# Patient Record
Sex: Female | Born: 1935 | Race: White | Hispanic: No | Smoking: Never smoker
Health system: Southern US, Community
[De-identification: ages and names within clinical notes are randomized; demographics above are authoritative.]

## PROBLEM LIST (undated history)

## (undated) DIAGNOSIS — R0602 Shortness of breath: Secondary | ICD-10-CM

## (undated) DIAGNOSIS — F32A Depression, unspecified: Secondary | ICD-10-CM

## (undated) DIAGNOSIS — C44300 Unspecified malignant neoplasm of skin of unspecified part of face: Secondary | ICD-10-CM

## (undated) DIAGNOSIS — C569 Malignant neoplasm of unspecified ovary: Secondary | ICD-10-CM

## (undated) DIAGNOSIS — R51 Headache: Secondary | ICD-10-CM

## (undated) DIAGNOSIS — M1990 Unspecified osteoarthritis, unspecified site: Secondary | ICD-10-CM

## (undated) DIAGNOSIS — R55 Syncope and collapse: Secondary | ICD-10-CM

## (undated) DIAGNOSIS — M719 Bursopathy, unspecified: Secondary | ICD-10-CM

## (undated) DIAGNOSIS — F329 Major depressive disorder, single episode, unspecified: Secondary | ICD-10-CM

## (undated) DIAGNOSIS — N390 Urinary tract infection, site not specified: Secondary | ICD-10-CM

## (undated) DIAGNOSIS — E039 Hypothyroidism, unspecified: Secondary | ICD-10-CM

## (undated) DIAGNOSIS — G4700 Insomnia, unspecified: Secondary | ICD-10-CM

## (undated) DIAGNOSIS — F419 Anxiety disorder, unspecified: Secondary | ICD-10-CM

## (undated) DIAGNOSIS — J439 Emphysema, unspecified: Secondary | ICD-10-CM

## (undated) DIAGNOSIS — E538 Deficiency of other specified B group vitamins: Secondary | ICD-10-CM

## (undated) DIAGNOSIS — K5792 Diverticulitis of intestine, part unspecified, without perforation or abscess without bleeding: Secondary | ICD-10-CM

## (undated) HISTORY — PX: DILATION AND CURETTAGE OF UTERUS: SHX78

## (undated) HISTORY — PX: FOOT SURGERY: SHX648

## (undated) HISTORY — PX: TONSILLECTOMY: SUR1361

## (undated) HISTORY — PX: THYROIDECTOMY: SHX17

## (undated) HISTORY — PX: ABDOMINAL HYSTERECTOMY: SHX81

---

## 1997-10-08 ENCOUNTER — Other Ambulatory Visit: Admission: RE | Admit: 1997-10-08 | Discharge: 1997-10-08 | Payer: Self-pay | Admitting: Gynecology

## 1998-09-12 HISTORY — PX: CATARACT EXTRACTION W/ INTRAOCULAR LENS  IMPLANT, BILATERAL: SHX1307

## 1999-02-16 ENCOUNTER — Other Ambulatory Visit: Admission: RE | Admit: 1999-02-16 | Discharge: 1999-02-16 | Payer: Self-pay | Admitting: Gynecology

## 2000-04-28 ENCOUNTER — Other Ambulatory Visit: Admission: RE | Admit: 2000-04-28 | Discharge: 2000-04-28 | Payer: Self-pay | Admitting: Gynecology

## 2001-06-27 ENCOUNTER — Other Ambulatory Visit: Admission: RE | Admit: 2001-06-27 | Discharge: 2001-06-27 | Payer: Self-pay | Admitting: Gynecology

## 2002-07-07 ENCOUNTER — Ambulatory Visit (HOSPITAL_COMMUNITY): Admission: RE | Admit: 2002-07-07 | Discharge: 2002-07-07 | Payer: Self-pay | Admitting: Internal Medicine

## 2002-07-07 ENCOUNTER — Encounter: Payer: Self-pay | Admitting: Internal Medicine

## 2002-07-10 ENCOUNTER — Encounter: Payer: Self-pay | Admitting: General Surgery

## 2002-07-11 ENCOUNTER — Encounter (INDEPENDENT_AMBULATORY_CARE_PROVIDER_SITE_OTHER): Payer: Self-pay | Admitting: Specialist

## 2002-07-11 ENCOUNTER — Inpatient Hospital Stay (HOSPITAL_COMMUNITY): Admission: RE | Admit: 2002-07-11 | Discharge: 2002-07-20 | Payer: Self-pay | Admitting: General Surgery

## 2002-07-31 ENCOUNTER — Ambulatory Visit: Admission: RE | Admit: 2002-07-31 | Discharge: 2002-07-31 | Payer: Self-pay | Admitting: Gynecologic Oncology

## 2002-08-29 ENCOUNTER — Ambulatory Visit (HOSPITAL_BASED_OUTPATIENT_CLINIC_OR_DEPARTMENT_OTHER): Admission: RE | Admit: 2002-08-29 | Discharge: 2002-08-29 | Payer: Self-pay | Admitting: General Surgery

## 2002-08-29 ENCOUNTER — Encounter: Payer: Self-pay | Admitting: General Surgery

## 2003-02-06 ENCOUNTER — Ambulatory Visit (HOSPITAL_COMMUNITY): Admission: RE | Admit: 2003-02-06 | Discharge: 2003-02-06 | Payer: Self-pay | Admitting: Oncology

## 2003-03-05 ENCOUNTER — Ambulatory Visit: Admission: RE | Admit: 2003-03-05 | Discharge: 2003-03-05 | Payer: Self-pay | Admitting: Gynecologic Oncology

## 2003-06-26 ENCOUNTER — Ambulatory Visit: Admission: RE | Admit: 2003-06-26 | Discharge: 2003-06-26 | Payer: Self-pay | Admitting: Gynecologic Oncology

## 2003-08-21 ENCOUNTER — Ambulatory Visit (HOSPITAL_COMMUNITY): Admission: RE | Admit: 2003-08-21 | Discharge: 2003-08-21 | Payer: Self-pay | Admitting: Oncology

## 2003-11-26 ENCOUNTER — Ambulatory Visit: Admission: RE | Admit: 2003-11-26 | Discharge: 2003-11-26 | Payer: Self-pay | Admitting: Gynecologic Oncology

## 2003-12-10 ENCOUNTER — Ambulatory Visit: Payer: Self-pay | Admitting: Oncology

## 2003-12-20 ENCOUNTER — Ambulatory Visit (HOSPITAL_COMMUNITY): Admission: RE | Admit: 2003-12-20 | Discharge: 2003-12-20 | Payer: Self-pay | Admitting: Gynecologic Oncology

## 2004-02-05 ENCOUNTER — Ambulatory Visit: Payer: Self-pay | Admitting: Oncology

## 2004-04-01 ENCOUNTER — Ambulatory Visit: Payer: Self-pay | Admitting: Oncology

## 2004-06-02 ENCOUNTER — Ambulatory Visit: Payer: Self-pay | Admitting: Oncology

## 2004-07-13 ENCOUNTER — Ambulatory Visit (HOSPITAL_COMMUNITY): Admission: RE | Admit: 2004-07-13 | Discharge: 2004-07-13 | Payer: Self-pay | Admitting: Oncology

## 2004-08-11 ENCOUNTER — Ambulatory Visit: Admission: RE | Admit: 2004-08-11 | Discharge: 2004-08-11 | Payer: Self-pay | Admitting: Gynecologic Oncology

## 2004-09-08 ENCOUNTER — Ambulatory Visit: Payer: Self-pay | Admitting: Oncology

## 2004-11-05 ENCOUNTER — Ambulatory Visit: Payer: Self-pay | Admitting: Oncology

## 2005-01-05 ENCOUNTER — Ambulatory Visit: Payer: Self-pay | Admitting: Oncology

## 2005-01-12 ENCOUNTER — Ambulatory Visit (HOSPITAL_COMMUNITY): Admission: RE | Admit: 2005-01-12 | Discharge: 2005-01-12 | Payer: Self-pay | Admitting: Oncology

## 2005-02-02 ENCOUNTER — Ambulatory Visit: Admission: RE | Admit: 2005-02-02 | Discharge: 2005-02-02 | Payer: Self-pay | Admitting: Gynecologic Oncology

## 2005-03-09 ENCOUNTER — Ambulatory Visit: Payer: Self-pay | Admitting: Oncology

## 2005-05-06 ENCOUNTER — Ambulatory Visit: Payer: Self-pay | Admitting: Oncology

## 2005-05-07 LAB — CBC WITH DIFFERENTIAL/PLATELET
Basophils Absolute: 0 10*3/uL (ref 0.0–0.1)
Eosinophils Absolute: 0 10*3/uL (ref 0.0–0.5)
HCT: 38 % (ref 34.8–46.6)
HGB: 13.1 g/dL (ref 11.6–15.9)
LYMPH%: 41.8 % (ref 14.0–48.0)
MCV: 88.6 fL (ref 81.0–101.0)
MONO%: 10.6 % (ref 0.0–13.0)
NEUT#: 1.7 10*3/uL (ref 1.5–6.5)
NEUT%: 45.6 % (ref 39.6–76.8)
Platelets: 198 10*3/uL (ref 145–400)
RDW: 11.9 % (ref 11.3–14.5)

## 2005-05-07 LAB — COMPREHENSIVE METABOLIC PANEL
ALT: <8 U/L (ref 0–40)
AST: 14 U/L (ref 0–37)
Alkaline Phosphatase: 58 U/L (ref 39–117)
Creatinine, Ser: 0.7 mg/dL (ref 0.4–1.2)
Sodium: 136 mEq/L (ref 135–145)
Total Bilirubin: 0.6 mg/dL (ref 0.3–1.2)
Total Protein: 7 g/dL (ref 6.0–8.3)

## 2005-06-30 ENCOUNTER — Ambulatory Visit: Payer: Self-pay | Admitting: Oncology

## 2005-08-27 ENCOUNTER — Ambulatory Visit: Payer: Self-pay | Admitting: Oncology

## 2005-09-18 ENCOUNTER — Emergency Department (HOSPITAL_COMMUNITY): Admission: EM | Admit: 2005-09-18 | Discharge: 2005-09-18 | Payer: Self-pay | Admitting: Family Medicine

## 2005-10-20 ENCOUNTER — Ambulatory Visit: Payer: Self-pay | Admitting: Oncology

## 2005-10-22 LAB — CBC WITH DIFFERENTIAL/PLATELET
BASO%: 2.5 % — ABNORMAL HIGH (ref 0.0–2.0)
Basophils Absolute: 0.1 10*3/uL (ref 0.0–0.1)
EOS%: 2.2 % (ref 0.0–7.0)
HCT: 36.3 % (ref 34.8–46.6)
HGB: 12.6 g/dL (ref 11.6–15.9)
MCH: 30.9 pg (ref 26.0–34.0)
MCHC: 34.8 g/dL (ref 32.0–36.0)
MCV: 88.7 fL (ref 81.0–101.0)
MONO%: 7.8 % (ref 0.0–13.0)
NEUT%: 55.7 % (ref 39.6–76.8)
RDW: 13.8 % (ref 11.3–14.5)

## 2005-10-22 LAB — COMPREHENSIVE METABOLIC PANEL
AST: 12 U/L (ref 0–37)
Alkaline Phosphatase: 63 U/L (ref 39–117)
BUN: 14 mg/dL (ref 6–23)
Creatinine, Ser: 0.86 mg/dL (ref 0.40–1.20)

## 2005-12-14 ENCOUNTER — Ambulatory Visit: Payer: Self-pay | Admitting: Oncology

## 2005-12-17 LAB — COMPREHENSIVE METABOLIC PANEL
BUN: 10 mg/dL (ref 6–23)
CO2: 22 mEq/L (ref 19–32)
Calcium: 9.3 mg/dL (ref 8.4–10.5)
Chloride: 103 mEq/L (ref 96–112)
Creatinine, Ser: 0.8 mg/dL (ref 0.40–1.20)

## 2005-12-17 LAB — CBC WITH DIFFERENTIAL/PLATELET
Basophils Absolute: 0 10*3/uL (ref 0.0–0.1)
HCT: 36.1 % (ref 34.8–46.6)
HGB: 12.7 g/dL (ref 11.6–15.9)
MONO#: 0.4 10*3/uL (ref 0.1–0.9)
NEUT%: 56.5 % (ref 39.6–76.8)
WBC: 4.7 10*3/uL (ref 3.9–10.0)
lymph#: 1.6 10*3/uL (ref 0.9–3.3)

## 2006-01-14 ENCOUNTER — Ambulatory Visit (HOSPITAL_COMMUNITY): Admission: RE | Admit: 2006-01-14 | Discharge: 2006-01-14 | Payer: Self-pay | Admitting: Oncology

## 2006-02-08 ENCOUNTER — Ambulatory Visit: Admission: RE | Admit: 2006-02-08 | Discharge: 2006-02-08 | Payer: Self-pay | Admitting: Gynecologic Oncology

## 2006-02-08 ENCOUNTER — Ambulatory Visit: Payer: Self-pay | Admitting: Oncology

## 2006-02-11 LAB — COMPREHENSIVE METABOLIC PANEL
Albumin: 4.4 g/dL (ref 3.5–5.2)
Alkaline Phosphatase: 64 U/L (ref 39–117)
BUN: 13 mg/dL (ref 6–23)
Calcium: 8.9 mg/dL (ref 8.4–10.5)
Creatinine, Ser: 0.75 mg/dL (ref 0.40–1.20)
Glucose, Bld: 82 mg/dL (ref 70–99)
Potassium: 4 mEq/L (ref 3.5–5.3)

## 2006-02-11 LAB — CBC WITH DIFFERENTIAL/PLATELET
Basophils Absolute: 0.2 10*3/uL — ABNORMAL HIGH (ref 0.0–0.1)
EOS%: 1.7 % (ref 0.0–7.0)
HCT: 35.7 % (ref 34.8–46.6)
HGB: 12.5 g/dL (ref 11.6–15.9)
LYMPH%: 36.2 % (ref 14.0–48.0)
MCH: 31 pg (ref 26.0–34.0)
MCV: 88.4 fL (ref 81.0–101.0)
MONO%: 8.7 % (ref 0.0–13.0)
NEUT%: 49.2 % (ref 39.6–76.8)
Platelets: 212 10*3/uL (ref 145–400)
lymph#: 1.7 10*3/uL (ref 0.9–3.3)

## 2006-04-12 ENCOUNTER — Ambulatory Visit: Payer: Self-pay | Admitting: Oncology

## 2006-04-15 LAB — CBC WITH DIFFERENTIAL/PLATELET
BASO%: 1.3 % (ref 0.0–2.0)
Eosinophils Absolute: 0.1 10*3/uL (ref 0.0–0.5)
LYMPH%: 38.5 % (ref 14.0–48.0)
MCHC: 34.3 g/dL (ref 32.0–36.0)
MONO#: 0.5 10*3/uL (ref 0.1–0.9)
NEUT#: 1.9 10*3/uL (ref 1.5–6.5)
Platelets: 187 10*3/uL (ref 145–400)
RBC: 4.24 10*6/uL (ref 3.70–5.32)
WBC: 4.2 10*3/uL (ref 3.9–10.0)
lymph#: 1.6 10*3/uL (ref 0.9–3.3)

## 2006-04-15 LAB — COMPREHENSIVE METABOLIC PANEL
ALT: 8 U/L (ref 0–35)
AST: 14 U/L (ref 0–37)
Albumin: 4.1 g/dL (ref 3.5–5.2)
BUN: 9 mg/dL (ref 6–23)
Calcium: 9 mg/dL (ref 8.4–10.5)
Chloride: 102 mEq/L (ref 96–112)
Potassium: 3.9 mEq/L (ref 3.5–5.3)

## 2006-04-15 LAB — CA 125: CA 125: 13.4 U/mL (ref 0.0–30.2)

## 2006-06-09 ENCOUNTER — Ambulatory Visit: Payer: Self-pay | Admitting: Oncology

## 2006-08-09 ENCOUNTER — Ambulatory Visit: Payer: Self-pay | Admitting: Oncology

## 2006-10-11 ENCOUNTER — Ambulatory Visit: Payer: Self-pay | Admitting: Oncology

## 2006-10-14 LAB — COMPREHENSIVE METABOLIC PANEL
ALT: <8 U/L (ref 0–35)
BUN: 9 mg/dL (ref 6–23)
CO2: 23 mEq/L (ref 19–32)
Calcium: 9 mg/dL (ref 8.4–10.5)
Chloride: 106 mEq/L (ref 96–112)
Creatinine, Ser: 0.68 mg/dL (ref 0.40–1.20)
Glucose, Bld: 80 mg/dL (ref 70–99)
Total Bilirubin: 0.7 mg/dL (ref 0.3–1.2)

## 2006-10-14 LAB — CBC WITH DIFFERENTIAL/PLATELET
BASO%: 1.2 % (ref 0.0–2.0)
Basophils Absolute: 0 10*3/uL (ref 0.0–0.1)
Eosinophils Absolute: 0 10*3/uL (ref 0.0–0.5)
HCT: 36 % (ref 34.8–46.6)
HGB: 12.9 g/dL (ref 11.6–15.9)
LYMPH%: 43.8 % (ref 14.0–48.0)
MONO#: 0.4 10*3/uL (ref 0.1–0.9)
NEUT#: 1.6 10*3/uL (ref 1.5–6.5)
NEUT%: 43.7 % (ref 39.6–76.8)
Platelets: 192 10*3/uL (ref 145–400)
WBC: 3.6 10*3/uL — ABNORMAL LOW (ref 3.9–10.0)
lymph#: 1.6 10*3/uL (ref 0.9–3.3)

## 2006-10-14 LAB — CA 125: CA 125: 13.5 U/mL (ref 0.0–30.2)

## 2006-12-05 ENCOUNTER — Ambulatory Visit: Payer: Self-pay | Admitting: Oncology

## 2007-02-01 ENCOUNTER — Ambulatory Visit: Payer: Self-pay | Admitting: Oncology

## 2007-02-03 LAB — CBC WITH DIFFERENTIAL/PLATELET
Basophils Absolute: 0 10*3/uL (ref 0.0–0.1)
Eosinophils Absolute: 0.1 10*3/uL (ref 0.0–0.5)
HCT: 36.8 % (ref 34.8–46.6)
HGB: 12.8 g/dL (ref 11.6–15.9)
LYMPH%: 38.5 % (ref 14.0–48.0)
MCV: 87.2 fL (ref 81.0–101.0)
MONO#: 0.4 10*3/uL (ref 0.1–0.9)
MONO%: 8.3 % (ref 0.0–13.0)
NEUT#: 2.3 10*3/uL (ref 1.5–6.5)
NEUT%: 50 % (ref 39.6–76.8)
Platelets: 204 10*3/uL (ref 145–400)
RBC: 4.23 10*6/uL (ref 3.70–5.32)
WBC: 4.6 10*3/uL (ref 3.9–10.0)

## 2007-02-03 LAB — COMPREHENSIVE METABOLIC PANEL
Alkaline Phosphatase: 68 U/L (ref 39–117)
BUN: 8 mg/dL (ref 6–23)
CO2: 21 mEq/L (ref 19–32)
Glucose, Bld: 83 mg/dL (ref 70–99)
Total Bilirubin: 0.5 mg/dL (ref 0.3–1.2)
Total Protein: 6.8 g/dL (ref 6.0–8.3)

## 2007-02-03 LAB — CA 125: CA 125: 11.9 U/mL (ref 0.0–30.2)

## 2007-02-15 ENCOUNTER — Ambulatory Visit (HOSPITAL_COMMUNITY): Admission: RE | Admit: 2007-02-15 | Discharge: 2007-02-15 | Payer: Self-pay | Admitting: Oncology

## 2007-03-29 ENCOUNTER — Ambulatory Visit: Payer: Self-pay | Admitting: Oncology

## 2007-05-24 ENCOUNTER — Ambulatory Visit: Payer: Self-pay | Admitting: Oncology

## 2007-05-26 LAB — CBC WITH DIFFERENTIAL/PLATELET
BASO%: 1.3 % (ref 0.0–2.0)
HCT: 40.2 % (ref 34.8–46.6)
LYMPH%: 26.3 % (ref 14.0–48.0)
MCHC: 33.9 g/dL (ref 32.0–36.0)
MCV: 89.3 fL (ref 81.0–101.0)
MONO#: 0.5 10*3/uL (ref 0.1–0.9)
MONO%: 9.9 % (ref 0.0–13.0)
NEUT%: 61.6 % (ref 39.6–76.8)
Platelets: 201 10*3/uL (ref 145–400)
WBC: 5.1 10*3/uL (ref 3.9–10.0)

## 2007-05-26 LAB — COMPREHENSIVE METABOLIC PANEL
ALT: 10 U/L (ref 0–35)
CO2: 24 mEq/L (ref 19–32)
Creatinine, Ser: 0.64 mg/dL (ref 0.40–1.20)
Glucose, Bld: 85 mg/dL (ref 70–99)
Total Bilirubin: 0.7 mg/dL (ref 0.3–1.2)

## 2007-05-26 LAB — CA 125: CA 125: 9.8 U/mL (ref 0.0–30.2)

## 2007-07-17 ENCOUNTER — Ambulatory Visit: Payer: Self-pay | Admitting: Oncology

## 2007-09-08 ENCOUNTER — Ambulatory Visit: Payer: Self-pay | Admitting: Oncology

## 2007-11-06 ENCOUNTER — Ambulatory Visit: Payer: Self-pay | Admitting: Oncology

## 2007-11-08 LAB — CBC WITH DIFFERENTIAL/PLATELET
BASO%: 1.1 % (ref 0.0–2.0)
EOS%: 1.3 % (ref 0.0–7.0)
MCH: 30.5 pg (ref 26.0–34.0)
MCHC: 34.2 g/dL (ref 32.0–36.0)
MCV: 89.3 fL (ref 81.0–101.0)
MONO%: 11.3 % (ref 0.0–13.0)
RBC: 3.98 10*6/uL (ref 3.70–5.32)
RDW: 14.5 % (ref 11.3–14.5)

## 2007-11-08 LAB — COMPREHENSIVE METABOLIC PANEL
ALT: 8 U/L (ref 0–35)
AST: 14 U/L (ref 0–37)
Albumin: 4.2 g/dL (ref 3.5–5.2)
Alkaline Phosphatase: 62 U/L (ref 39–117)
BUN: 9 mg/dL (ref 6–23)
Potassium: 4.1 mEq/L (ref 3.5–5.3)
Sodium: 137 mEq/L (ref 135–145)

## 2007-12-27 ENCOUNTER — Ambulatory Visit: Payer: Self-pay | Admitting: Oncology

## 2007-12-29 LAB — COMPREHENSIVE METABOLIC PANEL
ALT: 10 U/L (ref 0–35)
AST: 13 U/L (ref 0–37)
Albumin: 4.3 g/dL (ref 3.5–5.2)
CO2: 22 mEq/L (ref 19–32)
Calcium: 8.8 mg/dL (ref 8.4–10.5)
Chloride: 102 mEq/L (ref 96–112)
Potassium: 3.9 mEq/L (ref 3.5–5.3)

## 2007-12-29 LAB — CBC WITH DIFFERENTIAL/PLATELET
BASO%: 1.5 % (ref 0.0–2.0)
Basophils Absolute: 0.1 10*3/uL (ref 0.0–0.1)
EOS%: 1.6 % (ref 0.0–7.0)
HCT: 38.3 % (ref 34.8–46.6)
HGB: 13.2 g/dL (ref 11.6–15.9)
MCH: 30 pg (ref 26.0–34.0)
MCHC: 34.3 g/dL (ref 32.0–36.0)
MONO#: 0.4 10*3/uL (ref 0.1–0.9)
RDW: 11.9 % (ref 11.3–14.5)
WBC: 4.6 10*3/uL (ref 3.9–10.0)
lymph#: 1.4 10*3/uL (ref 0.9–3.3)

## 2007-12-29 LAB — CA 125: CA 125: 10.4 U/mL (ref 0.0–30.2)

## 2008-02-21 ENCOUNTER — Ambulatory Visit: Payer: Self-pay | Admitting: Oncology

## 2008-03-05 ENCOUNTER — Encounter: Admission: RE | Admit: 2008-03-05 | Discharge: 2008-03-05 | Payer: Self-pay | Admitting: Family Medicine

## 2008-03-07 ENCOUNTER — Ambulatory Visit: Payer: Self-pay | Admitting: Cardiology

## 2008-03-07 ENCOUNTER — Ambulatory Visit: Payer: Self-pay | Admitting: Internal Medicine

## 2008-03-07 LAB — CONVERTED CEMR LAB
BUN: 8 mg/dL (ref 6–23)
CO2: 30 meq/L (ref 19–32)
Calcium: 9.6 mg/dL (ref 8.4–10.5)
Chloride: 105 meq/L (ref 96–112)
Creatinine, Ser: 0.5 mg/dL (ref 0.4–1.2)
GFR calc Af Amer: 154 mL/min
GFR calc non Af Amer: 127 mL/min
Glucose, Bld: 91 mg/dL (ref 70–99)
Potassium: 4 meq/L (ref 3.5–5.1)
Pro B Natriuretic peptide (BNP): 39 pg/mL (ref 0.0–100.0)
Sodium: 140 meq/L (ref 135–145)

## 2008-03-20 ENCOUNTER — Encounter: Payer: Self-pay | Admitting: Cardiology

## 2008-03-20 ENCOUNTER — Ambulatory Visit: Payer: Self-pay

## 2008-03-28 DIAGNOSIS — E039 Hypothyroidism, unspecified: Secondary | ICD-10-CM | POA: Insufficient documentation

## 2008-03-28 DIAGNOSIS — R0602 Shortness of breath: Secondary | ICD-10-CM | POA: Insufficient documentation

## 2008-03-28 DIAGNOSIS — M279 Disease of jaws, unspecified: Secondary | ICD-10-CM | POA: Insufficient documentation

## 2008-03-29 ENCOUNTER — Encounter: Payer: Self-pay | Admitting: Cardiology

## 2008-03-29 ENCOUNTER — Ambulatory Visit: Payer: Self-pay | Admitting: Cardiology

## 2008-04-05 ENCOUNTER — Ambulatory Visit: Payer: Self-pay

## 2008-04-17 ENCOUNTER — Encounter: Admission: RE | Admit: 2008-04-17 | Discharge: 2008-04-17 | Payer: Self-pay | Admitting: Family Medicine

## 2008-04-17 ENCOUNTER — Ambulatory Visit: Payer: Self-pay | Admitting: Oncology

## 2008-04-23 ENCOUNTER — Ambulatory Visit: Payer: Self-pay

## 2008-05-10 ENCOUNTER — Ambulatory Visit: Payer: Self-pay

## 2008-06-12 ENCOUNTER — Ambulatory Visit: Payer: Self-pay | Admitting: Oncology

## 2008-06-14 ENCOUNTER — Encounter: Payer: Self-pay | Admitting: Cardiology

## 2008-06-14 LAB — COMPREHENSIVE METABOLIC PANEL
Alkaline Phosphatase: 60 U/L (ref 39–117)
BUN: 14 mg/dL (ref 6–23)
CO2: 25 mEq/L (ref 19–32)
Creatinine, Ser: 0.85 mg/dL (ref 0.40–1.20)
Glucose, Bld: 87 mg/dL (ref 70–99)
Total Bilirubin: 0.6 mg/dL (ref 0.3–1.2)

## 2008-06-14 LAB — CBC WITH DIFFERENTIAL/PLATELET
Basophils Absolute: 0 10*3/uL (ref 0.0–0.1)
EOS%: 0.6 % (ref 0.0–7.0)
Eosinophils Absolute: 0 10*3/uL (ref 0.0–0.5)
HCT: 38.1 % (ref 34.8–46.6)
HGB: 13 g/dL (ref 11.6–15.9)
LYMPH%: 31.3 % (ref 14.0–49.7)
MCH: 31.1 pg (ref 25.1–34.0)
MCV: 91 fL (ref 79.5–101.0)
MONO%: 8.2 % (ref 0.0–14.0)
NEUT%: 59.4 % (ref 38.4–76.8)
Platelets: 198 10*3/uL (ref 145–400)
RDW: 14.6 % — ABNORMAL HIGH (ref 11.2–14.5)

## 2008-07-08 ENCOUNTER — Ambulatory Visit (HOSPITAL_BASED_OUTPATIENT_CLINIC_OR_DEPARTMENT_OTHER): Admission: RE | Admit: 2008-07-08 | Discharge: 2008-07-08 | Payer: Self-pay | Admitting: General Surgery

## 2009-06-12 ENCOUNTER — Ambulatory Visit: Payer: Self-pay | Admitting: Oncology

## 2009-06-13 LAB — CBC WITH DIFFERENTIAL/PLATELET
BASO%: 0.9 % (ref 0.0–2.0)
Basophils Absolute: 0 10*3/uL (ref 0.0–0.1)
EOS%: 1.6 % (ref 0.0–7.0)
Eosinophils Absolute: 0.1 10*3/uL (ref 0.0–0.5)
HCT: 38 % (ref 34.8–46.6)
HGB: 13 g/dL (ref 11.6–15.9)
LYMPH%: 35.7 % (ref 14.0–49.7)
MCH: 31.1 pg (ref 25.1–34.0)
MCHC: 34.3 g/dL (ref 31.5–36.0)
MCV: 90.7 fL (ref 79.5–101.0)
MONO#: 0.4 10*3/uL (ref 0.1–0.9)
MONO%: 10.3 % (ref 0.0–14.0)
NEUT#: 2.1 10*3/uL (ref 1.5–6.5)
NEUT%: 51.5 % (ref 38.4–76.8)
Platelets: 189 10*3/uL (ref 145–400)
RBC: 4.19 10*6/uL (ref 3.70–5.45)
RDW: 14.1 % (ref 11.2–14.5)
WBC: 4.2 10*3/uL (ref 3.9–10.3)
lymph#: 1.5 10*3/uL (ref 0.9–3.3)

## 2009-06-13 LAB — COMPREHENSIVE METABOLIC PANEL
ALT: 9 U/L (ref 0–35)
AST: 14 U/L (ref 0–37)
Albumin: 4.4 g/dL (ref 3.5–5.2)
Alkaline Phosphatase: 49 U/L (ref 39–117)
BUN: 8 mg/dL (ref 6–23)
CO2: 26 mEq/L (ref 19–32)
Calcium: 9.3 mg/dL (ref 8.4–10.5)
Chloride: 103 mEq/L (ref 96–112)
Creatinine, Ser: 0.67 mg/dL (ref 0.40–1.20)
Glucose, Bld: 73 mg/dL (ref 70–99)
Potassium: 4.1 mEq/L (ref 3.5–5.3)
Sodium: 139 mEq/L (ref 135–145)
Total Bilirubin: 0.6 mg/dL (ref 0.3–1.2)
Total Protein: 6.9 g/dL (ref 6.0–8.3)

## 2009-06-13 LAB — CA 125: CA 125: 13.5 U/mL (ref 0.0–30.2)

## 2010-02-01 ENCOUNTER — Encounter: Payer: Self-pay | Admitting: Family Medicine

## 2010-05-26 NOTE — Assessment & Plan Note (Signed)
[facility]                            CARDIOLOGY OFFICE NOTE   [name], [name]                       MRN:          [id number]  DATE:03/07/2008                            DOB:          Aug 11, 1928    [name] is a pleasant 77 year old female, who is the wife of a  former patient of mine.  She has complaints of dyspnea and jaw pain and  we were asked to further evaluate.  The patient states that she  developed pneumonia in January.  She has been treated with antibiotics.  She did have a cough at that time, but it was nonproductive and there  was no fevers or chills.  Since then, she has continued to have dyspnea  on exertion.  However, there is no orthopnea, PND, or pedal edema.  She  has not had palpitations or syncope.  She does complain of weakness.  She also apparently has had several episodes of jaw pain.  These  occurred predominantly at night.  They were not exertional.  They  resolved spontaneously.  There is no associated nausea, vomiting,  shortness of breath, or diaphoresis.  Because of the above, we were  asked to further evaluate.  Note, the patient has not traveled recently  nor she injured her legs.   She has allergies to PENICILLIN.   PRESENT MEDICATIONS:  1. Synthroid 50 mcg p.o. daily which was recently adjusted by Dr.      Clarene Duke.  2. Vitamin D.  3. Vitamin B12 shots.  4. She takes Tylenol as needed.   SOCIAL HISTORY:  She is widowed.  She lives alone.  She does not smoke  nor does she consume alcohol.   FAMILY HISTORY:  Positive for coronary artery disease in her father.   PAST MEDICAL HISTORY:  There is no diabetes mellitus, hypertension, or  hyperlipidemia.  She does have a history of thyroid surgery and is now  hypothyroid.  She has a history of ovarian cancer who has had previous  chemotherapy as well as hysterectomy.  She has had a prior  tonsillectomy.  She does have osteoarthritis.  There is also history of  headaches.   REVIEW OF SYSTEMS:  She has not had headaches at present, nor has any  fevers or chills.  She has had a cough, but this been nonproductive.  There is no hemoptysis.  She does complain of generalized weakness.  There is no dysphagia, odynophagia, melena, or hematochezia.  There is  no dysuria or hematuria.  There is no rash or seizure activity.  There  is no orthopnea, PND, or pedal edema.  Remaining systems are negative.   PHYSICAL EXAMINATION:  VITAL SIGNS:  Her blood pressure is 132/78 and  her pulse is 70.  She weighs 119 pounds.  GENERAL:  She is well developed and well nourished in no acute distress.  SKIN:  Warm and dry.  She does not appear to be depressed.  There is no  peripheral clubbing.  BACK:  Normal.  HEENT:  Normal eyelids.  NECK:  Supple.  Normal upstroke bilaterally.  No bruits noted.  There is  no jugular distention.  I cannot appreciate thyromegaly.  She has had  previous thyroid surgery.  CHEST:  Clear to auscultation.  No expansion.  CARDIOVASCULAR:  Regular rhythm with normal S1 and S2.  I cannot  appreciate murmurs, rubs, or gallops.  ABDOMEN:  Nontender.  Positive bowel sounds.  No hepatosplenomegaly.  No  mass appreciated.  There is a mid abdominal bruit.  EXTREMITIES:  She  has 2+ femoral pulses bilaterally.  No bruits.  No edema .  I can  palpate no cords.  She has 2+ posterior tibial pulses bilaterally.  NEUROLOGIC:  Grossly intact.   Her electrocardiogram shows a sinus rhythm at a rate of 70.  The axis is  normal.  There are no ST changes noted.   DIAGNOSES:  1. Dyspnea - the etiology of this is unclear to me.  She does not      appear to be volume overloaded on examination today.  She did have      apparently recent pneumonia and she had a chest x-ray earlier this      week at [facility].  We will have that forwarded to [place]      for review.  Some of this may be the residual effects of her recent      pneumonia.  However, she  also has a history of ovarian cancer as      well as chemotherapy.  I do not know which agents were used, but we      will schedule her to have an echocardiogram to assess her left      ventricular function.  I also check a BNP.  She also given a      history of ovarian cancer, may have elevated risk of pulmonary      embolus.  We will check a D-dimer for screening purposes.  If it is      elevated, she may need a CT of her chest.  2. Jaw pain - this does not sound cardiac and electrocardiogram is      normal.  However, we will screen for coronary artery disease with a      Myoview.  If it is normal, we will not pursue this further.  3. History of ovarian cancer.  4. Hypothyroidism - her Synthroid was recently adjusted by Dr. Clarene Duke      and he will follow this.   We will see her back in 2-4 weeks.     Madolyn Frieze Jens Som, MD, [facility]  Electronically Signed    BSC/MedQ  DD: 03/07/2008  DT: 03/07/2008  Job #: [id number]   cc:   [name]

## 2010-05-26 NOTE — Assessment & Plan Note (Signed)
[facility]                            CARDIOLOGY OFFICE NOTE   JOLETTA, MANNER                       MRN:          [id number]  DATE:03/29/2008                            DOB:          [birth year]    [name] is a pleasant 77 year old female who I recently saw for  dyspnea and jaw pain.  A Myoview was performed on March 20, 2008.  Her  ejection fraction was 66% and there was normal perfusion.  She also had  an echocardiogram performed on March 20, 2008.  Her LV size and function  were normal.  The right ventricle and right atrium were mildly dilated.  There was no TR to assess pulmonary pressures.  There was mild aortic  insufficiency and mitral regurgitation.  The patient also had a BMP that  was normal at 39.  She had a mildly elevated D-dimer, but her chest CT  showed no pulmonary embolus.  Since then, she has improved somewhat.  She has mild dyspnea on exertion, but it is better than when I saw her  on March 07, 2008.  It resolves promptly with rest and only occurs  with exertion.  There is no orthopnea or PND.  There is no associated  chest pain.  She does state that she occasionally feels dizzy when she  turns her head in a certain way.  She does not have frank syncope.  There is no associated palpitations or chest pain.  It resolves after  turning her head to back to the previous position.   MEDICATIONS:  1. Synthroid 50 mcg p.o. daily.  2. Vitamin B12.  3. Vitamin D.  4. Tylenol as needed.   PHYSICAL EXAMINATION:  VITAL SIGNS:  Blood pressure of 139/82 and her  pulse is 69.  HEENT:  Normal.  NECK:  Supple.  I cannot appreciate bruits.  NEUROLOGIC:  There is no focal neurological findings.  CHEST:  Clear.  CARDIOVASCULAR:  Regular rate and rhythm.  ABDOMEN:  No tenderness.  EXTREMITIES:  No edema.   DIAGNOSES:  1. Dizziness - I wonder whether the patient may have a carotid      hypersensitivity.  I will schedule to have  carotid Dopplers to make      sure that there is no significant obstruction.  If it is normal,      then we will not pursue this further.  Note, she has not had      syncope.  2. Dyspnea - note her symptoms have improved and her Myoview was      normal, her echocardiogram showed normal LV function, she had a      normal BNP and the CT showed no pulmonary embolus.  This may be      merely residual effects from her previous upper respiratory      infection as it is improving.  I will not pursue this further at      this point.  3. History of ovarian cancer.  4. Hypothyroidism - she will continue on Synthroid.   I will see her back on as-needed  basis pending the results of her  Dopplers.     Madolyn Frieze Jens Som, MD, [facility]  Electronically Signed    BSC/MedQ  DD: 03/29/2008  DT: 03/30/2008  Job #: [id number]   cc:   [name]

## 2010-05-26 NOTE — Op Note (Signed)
[name], [name] NO.:  [id number]   MEDICAL RECORD NO.:  [id number]          PATIENT TYPE:  AMB   LOCATION:  DSC                          FACILITY:  [facility]   PHYSICIAN:  Anselm Pancoast. Weatherly, M.D.DATE OF BIRTH:  [birth year]   DATE OF PROCEDURE:  07/08/2008  DATE OF DISCHARGE:                               OPERATIVE REPORT   PREOPERATIVE DIAGNOSIS:  Port-A-Cath nonuse 5 years, status post  treatment of ovarian cancer.   OPERATION:  Removal of Port-A-Cath.   ANESTHESIA:  Local anesthesia, minor surgery room.   SURGEON:  Anselm Pancoast. Zachery Dakins, MD   HISTORY:  [name] is a 77 year old female who approximately 6 years  ago had a recurrent ovarian cancer.  Her original surgeries have been  about 6 years earlier.  Dr. De Blanch operated on her and  she was treated with chemotherapy by Dr. Darrold Span.  She has had no  evidence of disease and the Port-A-Cath was last used about 4 years ago.  It has been flushed on a 83-month cycle and she desires to have it  removed.  She has not been on Coumadin recently and she is here for the  planned procedure at this time.  She is aware of the possibility of some  pain, but local anesthesia should be fine and we anesthetized the area  around the Port-A-Cath.  After prepping the skin widely with Betadine  solution, a little incision where the Port-A-Cath had been inserted, the  proximal two-thirds of the area was opened, it is a metal Port-A-Cath,  and by the little incision through the old skin incision identified the  Port-A-Cath.  The little 4 or 3 Prolene sutures that anchored this to  the chest wall could be identified and cut, and then I was able to pull  the Port-A-Cath out of the pocket, placed a 3-0 chromic suture around  the sheath, around the Port-A-Cath, and then withdrew the Port-A-Cath  tying little chromic suture.  A second little chromic suture was used  to, kind of, obliviate the dead space where it  has got the little sheath  around the Port-A-Cath and then the subcuticular 4-0 Monocryl was used  for this subcutaneous sutures and then benzoin and 1/2-inch Steri-Strips  on the skin.  The patient tolerated the procedure nicely and was  released after a short period.  She will replace the Steri-Strips if  they come off in the next week and I will see her back in the office for  followup appointment approximately 2 months.  Tylenol should be adequate  for pain and she will keep the area dry for approximately 2-3 days.      Anselm Pancoast. Zachery Dakins, M.D.  Electronically Signed     Anselm Pancoast. Zachery Dakins, M.D.  Electronically Signed   WJW/MEDQ  D:  07/08/2008  T:  07/09/2008  Job:  [id number]   cc:   Lennis P. Darrold Span, M.D.

## 2010-05-29 NOTE — Discharge Summary (Signed)
[name], [name]                          ACCOUNT NO.:  [id number]   MEDICAL RECORD NO.:  [id number]                   PATIENT TYPE:  INP   LOCATION:  0481                                 FACILITY:  [facility]   PHYSICIAN:  Anselm Pancoast. Zachery Dakins, M.D.          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   OPERATION:  Exploratory laparotomy, resection of small bowel, and recurrent  ovarian cancer.   HISTORY:  [name] is a 77 year old female who is now 14 years from  total abdominal hysterectomy, bilateral salpingo-oophorectomy, and  omentectomy that was for early carcinoma of the ovary.  She was followed for  10 years by Dr. Cleone Slim with no evidence of any recurring problems.  For over  the last month or approximately 2 she has been having vague lower abdominal  pain.  The pain came on the 10th, and she was seen at [facility]  approximately 4 days ago, and I was called to evaluate her after she was  sent for a CT on Saturday evening.  On examination on CT scan she had a  thickened area in the central that looked like kind of a chronic, probably  necrotic recurrent tumor.  She had a partial small-bowel distention.  No  evidence of diverticulitis or colon obstruction.  They were questioning  clinically whether she had diverticulitis.  I could not definitely see the  appendix, and she was not sure whether she had the appendix removed or not,  but the fluid collection and thickening certainly appeared to be more in the  pelvis and not around where the appendix would be, and I thought this was  most likely a recurrent carcinoma.  The patient was fairly constipated, and  I recommended that she be on a clear-liquid diet and let me reexamine her in  the office in  approximately 2 days so we could get the records from Dr. Cleone Slim  and associates and then electively schedule her for surgery.  She was in  agreement with this, and I saw her back in the office after she had been on  a liquid diet, some Fleet enema, and we could not really feel any definite  masses.  The area is up out of the true pelvis, and on pelvic exam and  rectal exam we could not feel any sidewall extensions or areas that were  obviously recurrent ovarian cancer.  The patient could be scheduled for  surgery in 2 days.  I obtained records from Dr. Cleone Slim and also Dr. Annamarie Dawley  office, who had done her surgery, and it appeared that this had been an  early cancer with negative nodes.  She was then electively scheduled for  admission 2 days later on July 11, 2002, after a bowel prep.   HOSPITAL COURSE:  The patient was taken to surgery.  Dr. Jamey Ripa assisted,  and the findings were that this was definitely carcinoma that appeared to  arise in the pelvis.  The small bowel was partially obstructed.  There was  an area of necrosis and kind of a chronic perforation from the small bowel,  and this was all removed, and 2 actual anastomoses were performed.  The  tumor was poorly differentiated adenocarcinoma.  It had been in the proximal  small bowel, showed mesenteric abscess and intraintestinal abscess and  adhesions, and there was 1 area that was kind of pushing on the dome of the  bladder, but it was such a diffuse area we thought that it would be best to  treat this with chemotherapy or radiation.  It was not possible to do a  cystectomy or remove that portion of the bladder.  Postoperatively the  patient had an NG tube and kind of slowly started having intestinal  function.  Dr. Jamey Ripa followed her in my absence.  I was on vacation the  following week, and her incisions appeared to heal satisfactorily.  The  tumor on review by the pathologist was thought most likely to be a recurrent  ovarian  cancer, and she was seen by Dr. Darrold Span postoperatively.  Her  incision appeared to be healing satisfactorily, and she started having bowel  function.  The cultures from the fluid in the pelvis did not show any actual  organisms, and she was on antibiotics, Cipro, for approximately 5 days after  surgery.  With having bowel function, incisions healing nicely, she was  discharged on, I think, her 6th postoperative morning, and arrangements will  be made for her to see Dr. Kyla Balzarine as  well as me in follow-up.  If the tumor is felt to be recurrent ovarian by  Dr. Kyla Balzarine, then Dr. Darrold Span will coordinate her chemotherapy administration.  The patient was discharged on her usual home medicine, Tylenol as needed for  pain, and will see me in the office in approximately 5-7 days.                                               Anselm Pancoast. Zachery Dakins, M.D.    WJW/MEDQ  D:  08/07/2002  T:  08/07/2002  Job:  [id number]   cc:   Lennis P. Darrold Span, M.D.  [address] [facility]  [place]  [place] [address]  Fax: [phone]

## 2010-05-29 NOTE — H&P (Signed)
NAMERAKIYA, KRAWCZYK NO.:  [id number]   MEDICAL RECORD NO.:  [id number]                   PATIENT TYPE:  INP   LOCATION:  X009                                 FACILITY:  [facility]   PHYSICIAN:  Anselm Pancoast. Zachery Dakins, M.D.          DATE OF BIRTH:  May 26, 1928   DATE OF ADMISSION:  07/11/2002  DATE OF DISCHARGE:                                HISTORY & PHYSICAL   CHIEF COMPLAINT:  Lower abdominal pain.   HISTORY OF PRESENT ILLNESS:  [name] is a 77 year old female who is now  14 years following a total abdominal hysterectomy, bilateral salpingo-  oophorectomy, and omentectomy for an early carcinoma of the ovaries.  She  was followed for 10 years with Dr. Lafayette Dragon, but no evidence of any other  problems.  Over the last month or approximately two, she has been having  vague lower abdominal pains.  The pain became more intense and she was seen  in [facility] on Saturday, and I was called to see her after she presented  to the floor with a CAT scan.  The CAT scan showed an obvious mass within  the pelvis, not really retroperitoneally, but kind of pushing the dome of  the bladder.  It was a thickened area with kind of a centralized area that  looked like necrosis, and no evidence of any intestinal obstruction, a  little bit of free fluid in the pelvis, and radiologically this certainly  appeared to be more of a recurrent cancer.  We could see the __________  normal, and you could also see the descending and sigmoid colon that did not  show any evidence of any diverticulitis.  The patient was not sure whether  she had her appendix removed when she had her hysterectomy but obviously she  had not.  I thought that it would be best to go ahead and place her on  antibiotics. She had  enlargement of stool in the colon, and we put her on a  clear liquid diet and I saw her back in the office in about 24 hours later,  and she said that the pain was definitely less.  Her  white count had  originally been 12,000, it was now down to about 10.  She was still vaguely  tender in the lower abdomen, but only to the left of the midline, but on  bimanual pelvic examination you could feel fullness somewhat at the top of  the bladder.  I thought that this was most consistent with probable ovarian  cancer.  The operative report from Dr. Lorin Picket Bowie's office was obtained,  and it appeared that this was all localized with no evidence of lymph node  involvement apparently at the time but this was 14 years ago.  In addition  to the Cipro that she had been on, she was put on p.o. Flagyl, and scheduled  her for surgery 48  hours later.  She says she is actually feeling better,  and when she arrived this morning we started her IV and started her on IV  Cipro.   MEDICATIONS:  1. Trazodone 150 mg daily.  2. Zocor.  3. Hydrocodone that we started her on when we saw her in the emergency room.  4. Evista 60 mg.   ALLERGIES:  She states that she is allergic to PENICILLIN.   PAST SURGICAL HISTORY:  1. Thyroidectomy.  2. Total abdominal hysterectomy as noted above.   PHYSICAL EXAMINATION:  GENERAL:  She is an elderly, but spry Caucasian  female in no acute distress.  VITAL SIGNS:  Temperature 98.5, pulse 74, respirations 14, blood pressure  102/58, she is 5 feet 3 inches, weighs 115 pounds.  She says her highest  weight has only been about 120.  HEENT:  Normocephalic.  Pupils reacted.  Well-hydrated.  NECK:  No cervical or supraclavicular lymphadenopathy.  LUNGS:  Clear.  BREASTS:  Normal.  She is followed with yearly mammograms.  CARDIAC:  Normal sinus rhythm.  ABDOMEN:  She has kind of a flat abdomen, well-healed lower midline  incision, a little fullness to the left of the midline.  PELVIC:  Done in the office two days earlier, but did show a fullness in the  top of the bladder.  RECTAL:  No stool in the rectum on examination in the office on Monday.  EXTREMITIES:   Lower extremities with no pedal edema, good peripheral pulses.   ADMISSION IMPRESSION:  Pelvic mass, probably recurrent ovarian cancer,  possibly a chronic abscess.  I am not sure of the etiology of it.   PLAN:  She is going for exploratory laparotomy later today after starting IV  Cipro.                                                Anselm Pancoast. Zachery Dakins, M.D.    WJW/MEDQ  D:  07/11/2002  T:  07/11/2002  Job:  [id number]

## 2010-05-29 NOTE — Consult Note (Signed)
NAMESHIVON, HACKEL NO.:  [id number]   MEDICAL RECORD NO.:  [id number]          PATIENT TYPE:  OUT   LOCATION:  GYN                          FACILITY:  [facility]   PHYSICIAN:  John T. Kyla Balzarine, M.D.    DATE OF BIRTH:  1928/05/10   DATE OF CONSULTATION:  02/08/2006  DATE OF DISCHARGE:                                 CONSULTATION   CHIEF COMPLAINT:  Follow up of recurrent gynecologic  adenocarcinoma/ovarian cancer.   HISTORY OF PRESENT ILLNESS:  The patient initially had a stage IA  ovarian cancer in 1990. In June 2004, exploratory laparotomy by Dr.  Zachery Dakins was performed with resection of a pelvic mass and adjacent  abscess involving small bowel.  This was thought to be an adenocarcinoma  of gynecologic origin, possibly recurrent ovarian cancer and she was  treated with 6 cycles of Taxol and carboplatin through December 2004.  She has been followed since without evidence of recurrent disease.  Port-  A-Cath was being flushed and CA-125 values have remained less than 20  since completion of chemotherapy.   Past history, personal social history, family history and review of  systems are unchanged from those recorded in November 2005.   CURRENT MEDICATIONS:  Trazodone, Zocor and Hydrocodone.   ALLERGIES:  PENICILLIN causes a rash.   REVIEW OF SYSTEMS:  The patient relates combined urinary urgency and  incontinence but she rarely wears a pad.  However, this symptom has  increased since she was last seen and she wishes evaluation.  Bowel  function is normal and she denies abdominal bloating or early satiety.  She does note occasional pulling discomfort in the left lower quadrant.  Otherwise comprehensive 10-point review of systems negative.   PHYSICAL EXAMINATION:  VITAL SIGNS:  Weight 144.5 pounds, blood pressure  120/70 and afebrile.  GENERAL:  The patient is anxious, alert and oriented x3 in no acute  distress.  LYMPHATIC:  Lymphatic survey is negative for  pathologic lymphadenopathy.  LUNGS:  Lung fields clear.  There is no back or CVA tenderness.  ABDOMEN:  The abdomen is soft and benign with well-healed incision.  No  hernia, ascites, tenderness, mass or organomegaly.  EXTREMITIES:  Full strength and range of motion without edema.  PELVIC:  External genitalia and BUS are normal to inspection and palpation. The  bladder and urethra are normal.  Vaginal mucosa is clear.  Bimanual and  rectovaginal examinations reveal no mass or nodularity and no  tenderness.   LABORATORY DATA:  I reviewed CT of abdomen and pelvis that was performed  on January 4 and reveals no lesions compatible with metastatic disease.  There is a stable increased density in the celiac axis and two stable  pancreatic lesions thought likely sequelae of prior pancreatitis.  The  patient has diverticulosis without evidence of inflammation.  No  evidence of carcinomatosis, ascites or retroperitoneal lymphadenopathy.   CA-125 from December 2007 was 13.   ASSESSMENT:  Likely recurrent ovarian carcinoma in sustained remission.   PLAN:  The patient will continue to be followed by Dr. Darrold Span and we  would see her annually.  I would recommend that she have annual CT of  the abdomen and pelvis and at least every 6 months CA-125 values  indefinitely.      John T. Kyla Balzarine, M.D.  Electronically Signed     JTS/MEDQ  D:  02/08/2006  T:  02/08/2006  Job:  [id number]   cc:   [name]  Fax: [phone]   Anselm Pancoast. Zachery Dakins, M.D.  [address]  [place]  [place] [address]   Telford Nab, R.N.  [phone] [address]  [place], [place] [address]   Lennis P. Darrold Span, M.D.  Fax: [phone]

## 2010-07-03 ENCOUNTER — Other Ambulatory Visit: Payer: Self-pay | Admitting: Oncology

## 2010-07-03 ENCOUNTER — Encounter (HOSPITAL_BASED_OUTPATIENT_CLINIC_OR_DEPARTMENT_OTHER): Payer: Medicare Other | Admitting: Oncology

## 2010-07-03 DIAGNOSIS — Z8543 Personal history of malignant neoplasm of ovary: Secondary | ICD-10-CM

## 2010-07-03 DIAGNOSIS — C569 Malignant neoplasm of unspecified ovary: Secondary | ICD-10-CM

## 2010-07-03 LAB — CBC WITH DIFFERENTIAL/PLATELET
BASO%: 0.5 % (ref 0.0–2.0)
Basophils Absolute: 0 10*3/uL (ref 0.0–0.1)
EOS%: 0.8 % (ref 0.0–7.0)
Eosinophils Absolute: 0 10*3/uL (ref 0.0–0.5)
HGB: 12.4 g/dL (ref 11.6–15.9)
LYMPH%: 38.2 % (ref 14.0–49.7)
MCH: 30.5 pg (ref 25.1–34.0)
MCHC: 34.8 g/dL (ref 31.5–36.0)
MCV: 87.6 fL (ref 79.5–101.0)
MONO#: 0.4 10*3/uL (ref 0.1–0.9)
MONO%: 8.6 % (ref 0.0–14.0)
NEUT%: 51.9 % (ref 38.4–76.8)
RBC: 4.05 10*6/uL (ref 3.70–5.45)
RDW: 13.4 % (ref 11.2–14.5)
lymph#: 1.7 10*3/uL (ref 0.9–3.3)

## 2010-07-03 LAB — COMPREHENSIVE METABOLIC PANEL
ALT: 8 U/L (ref 0–35)
AST: 13 U/L (ref 0–37)
Alkaline Phosphatase: 61 U/L (ref 39–117)
Calcium: 9.7 mg/dL (ref 8.4–10.5)
Chloride: 103 mEq/L (ref 96–112)
Creatinine, Ser: 0.69 mg/dL (ref 0.50–1.10)
Potassium: 4.4 mEq/L (ref 3.5–5.3)

## 2010-10-05 ENCOUNTER — Ambulatory Visit
Admission: RE | Admit: 2010-10-05 | Discharge: 2010-10-05 | Disposition: A | Discharge status: Home / Self Care | Payer: Medicare Other | Source: Ambulatory Visit | Attending: Family Medicine | Admitting: Family Medicine

## 2010-10-05 ENCOUNTER — Other Ambulatory Visit: Payer: Self-pay | Admitting: Family Medicine

## 2010-10-05 DIAGNOSIS — E782 Mixed hyperlipidemia: Secondary | ICD-10-CM

## 2010-10-05 DIAGNOSIS — R5381 Other malaise: Secondary | ICD-10-CM

## 2010-10-05 DIAGNOSIS — I259 Chronic ischemic heart disease, unspecified: Secondary | ICD-10-CM

## 2010-11-02 ENCOUNTER — Encounter (INDEPENDENT_AMBULATORY_CARE_PROVIDER_SITE_OTHER): Payer: Medicare Other

## 2010-11-02 DIAGNOSIS — R55 Syncope and collapse: Secondary | ICD-10-CM

## 2010-11-06 ENCOUNTER — Encounter: Payer: Self-pay | Admitting: Internal Medicine

## 2010-11-06 ENCOUNTER — Inpatient Hospital Stay (INDEPENDENT_AMBULATORY_CARE_PROVIDER_SITE_OTHER)
Admission: RE | Admit: 2010-11-06 | Discharge: 2010-11-06 | Disposition: A | Discharge status: Home / Self Care | Payer: Medicare Other | Source: Ambulatory Visit | Attending: Family Medicine | Admitting: Family Medicine

## 2010-11-06 ENCOUNTER — Telehealth: Payer: Self-pay | Admitting: Cardiology

## 2010-11-06 ENCOUNTER — Encounter: Payer: Self-pay | Admitting: Cardiology

## 2010-11-06 ENCOUNTER — Emergency Department (HOSPITAL_COMMUNITY): Payer: Medicare Other

## 2010-11-06 ENCOUNTER — Inpatient Hospital Stay (HOSPITAL_COMMUNITY)
Admission: EM | Admit: 2010-11-06 | Discharge: 2010-11-09 | DRG: 392 | Disposition: A | Discharge status: Home Health | Payer: Medicare Other | Attending: Internal Medicine | Admitting: Internal Medicine

## 2010-11-06 DIAGNOSIS — R55 Syncope and collapse: Secondary | ICD-10-CM | POA: Diagnosis present

## 2010-11-06 DIAGNOSIS — K869 Disease of pancreas, unspecified: Secondary | ICD-10-CM | POA: Diagnosis present

## 2010-11-06 DIAGNOSIS — F411 Generalized anxiety disorder: Secondary | ICD-10-CM | POA: Diagnosis present

## 2010-11-06 DIAGNOSIS — J438 Other emphysema: Secondary | ICD-10-CM | POA: Diagnosis present

## 2010-11-06 DIAGNOSIS — M6281 Muscle weakness (generalized): Secondary | ICD-10-CM

## 2010-11-06 DIAGNOSIS — K5732 Diverticulitis of large intestine without perforation or abscess without bleeding: Principal | ICD-10-CM | POA: Diagnosis present

## 2010-11-06 DIAGNOSIS — E039 Hypothyroidism, unspecified: Secondary | ICD-10-CM | POA: Diagnosis present

## 2010-11-06 DIAGNOSIS — Z66 Do not resuscitate: Secondary | ICD-10-CM | POA: Diagnosis present

## 2010-11-06 DIAGNOSIS — Z79899 Other long term (current) drug therapy: Secondary | ICD-10-CM

## 2010-11-06 DIAGNOSIS — R109 Unspecified abdominal pain: Secondary | ICD-10-CM

## 2010-11-06 DIAGNOSIS — Z8543 Personal history of malignant neoplasm of ovary: Secondary | ICD-10-CM

## 2010-11-06 LAB — LIPASE, BLOOD: Lipase: 15 U/L (ref 11–59)

## 2010-11-06 LAB — COMPREHENSIVE METABOLIC PANEL
Alkaline Phosphatase: 76 U/L (ref 39–117)
BUN: 9 mg/dL (ref 6–23)
CO2: 27 mEq/L (ref 19–32)
Chloride: 96 mEq/L (ref 96–112)
GFR calc Af Amer: >90 mL/min (ref >90)
GFR calc non Af Amer: 83 mL/min — ABNORMAL LOW (ref >90)
Glucose, Bld: 96 mg/dL (ref 70–99)
Potassium: 3.9 mEq/L (ref 3.5–5.1)
Total Bilirubin: 0.7 mg/dL (ref 0.3–1.2)

## 2010-11-06 LAB — URINALYSIS, ROUTINE W REFLEX MICROSCOPIC
Bilirubin Urine: NEGATIVE
Nitrite: NEGATIVE
Specific Gravity, Urine: 1.016 (ref 1.005–1.030)
pH: 6 (ref 5.0–8.0)

## 2010-11-06 LAB — CBC
Hemoglobin: 13.1 g/dL (ref 12.0–15.0)
MCH: 30.7 pg (ref 26.0–34.0)
MCV: 88.1 fL (ref 78.0–100.0)
RBC: 4.27 MIL/uL (ref 3.87–5.11)

## 2010-11-06 LAB — DIFFERENTIAL
Lymphs Abs: 2 10*3/uL (ref 0.7–4.0)
Monocytes Absolute: 0.9 10*3/uL (ref 0.1–1.0)
Monocytes Relative: 11 % (ref 3–12)
Neutro Abs: 5.2 10*3/uL (ref 1.7–7.7)
Neutrophils Relative %: 63 % (ref 43–77)

## 2010-11-06 LAB — URINE MICROSCOPIC-ADD ON

## 2010-11-06 LAB — CK TOTAL AND CKMB (NOT AT ARMC): Relative Index: INVALID (ref 0.0–2.5)

## 2010-11-06 MED ORDER — IOHEXOL 300 MG/ML  SOLN
100.0000 mL | Freq: Once | INTRAMUSCULAR | Status: AC | PRN
  Administered 2010-11-06: 100 mL via INTRAVENOUS

## 2010-11-06 NOTE — Telephone Encounter (Signed)
Pt's daughter said she is having dizzy spells. She wore a heart monitor from Monday till Wed. Also her side is hurting. She said she may take her to urgent care. Please call and let her know what to do

## 2010-11-06 NOTE — H&P (Signed)
Hospital Admission Note Date: 11/06/2010  Patient name: [name] Medical record number: [id number] Date of birth: Jan 21, 1928 Age: 77 y.o. Gender: female PCP: Dr. [name] - [facility]  Medical Service: Internal Medicine Teaching Service  Attending physician: Dr. Cliffton Asters    1st Contact: Dr. Janalyn Harder  Pager: [phone] 2nd Contact: Dr. Johnette Abraham Pager: [phone] After 5 pm or weekends: 1st Contact:      Pager: [phone] 2nd Contact:      Pager: [phone]  Chief Complaint: Left lower quadrant pain, Nausea CODE STATUS: DNR  History of Present Illness: Patient is an 77 yo woman with PMH significant for ovarian Ca who presents with left lower quadrant pain, intermittent weakness & sweating and nausea for 2 weeks.  She reports a similar pain when she was found to have recurrent ovarian cancer in June 2004.  She reports that she was unable to sleep on the night PTA and so she went to urgent care on the day of admission.  She describes pain as intermittent, sharp, nonradiating, 8-9/10 at worst, with no alleviating or exacerbating factors.  During interview, she is not in any pain, and was not given medication in the ED.  She reports her nausea worsened after eating 2 peanut butter crackers on the morning of admission, and subsequently has not eaten since.  She denies vomiting, fever, chills and diarrhea.  Her last BM was on the morning of admission & it was formed and non bloody or black. She endorses decreased appetite over the last few weeks.  She reports that she has a history of diverticulosis that was found on colonoscopy 3 years ago.  In addition to abdominal pain, patient reports that she has felt weakness & sweating with spells of almost syncopizing for the last year.  She has never actually lost consciousness, but has hit the floor in the past.  She has been extensively worked up, including recent holter monitor (11/02/10-11/04/10), with no clear etiology.  She  notes she has undergone holter monitoring in the past which was normal.  She describes episodes of feeling weak when turning her head, and subsequently feels hot & flushed and feels that her legs are heavy and might fall out from under her.  It does not feel like the room is spinning, and has been ruled out for vertigo.  Episodes only occur when standing & last episode was at 5pm on the day prior to admission while she was cooking salmon patty.    Meds: Synthroid daily Diazepam 2mg  qHS Citalopram 10mg  daily   Facility-Administered Medications Ordered in Other Visits  Medication Dose Route Frequency Provider Last Rate Last Dose  . iohexol (OMNIPAQUE) 300 MG/ML injection 100 mL  100 mL Intravenous Once PRN Medication Radiologist   100 mL at 11/06/10 2022    Allergies: Amoxicillin (sick stomach), Prozac (rash on forehead)  Past Medical History: Ovarian Ca with recurrence in 2004 x 2 & 2007 (Dr. Lionel December was oncologist, patient had been cleared from having to be seen in June 2012), Hypothyroidism, Emphysema (never smoker), Anxiety, Diverticulosis, Question of colon Ca?  Past Surgical History:hysterectomy with bilateral oopherectomy (17-18 years prior to admission), small intestine resection  Family History: Social History  . Marital Status: Widowed    Number of Children: 2- both live nearby, son lives right next door   Social History Main Topics  . Smoking status: Never smoker  . Alcohol Use: none  . Drug Use: none   Other Topics Concern  .  Not on file   Social History Narrative  . Patient lives in her own home in [place], [place], and her son lives next door.     Review of Systems: General: no fevers, chills, changes in weight Skin: no rash HEENT: no blurry vision, hearing changes, sore throat Pulm: no dyspnea, coughing, wheezing CV: no chest pain, baseline shortness of breath Abd: as per HPI GU: no dysuria, hematuria, polyuria Ext: no arthralgias, myalgias Neuro: no  weakness, numbness, or tingling  Physical Exam: Vitals: T: 97.8    HR:65     BP:134/62     RR:15     O2 saturation: 98% RA General: resting in bed, no acute distress HEENT: PERRL, EOMI, no scleral icterus, no conjunctival pallor Cardiac: RRR, no audible rubs, murmurs or gallops, but heart sounds difficult to auscultate  Pulm: clear to auscultation bilaterally, moving normal volumes of air, no wheezing/rales/rhonchi Abd: soft, mild tenderness to deep palpation of the epigastric region, nondistended, BS normoactive Ext: warm and well perfused, no pedal edema, palpable pedal pulses Neuro: alert and oriented X3, cranial nerves II-XII grossly intact, strength and sensation to light touch equal in bilateral upper and lower extremities  Lab results: Basic Metabolic Panel:  Basename 11/06/10 1626  NA 134*  K 3.9  CL 96  CO2 27  GLUCOSE 96  BUN 9  CREATININE 0.62  CALCIUM 10.1  MG --  PHOS --   Liver Function Tests:  Basename 11/06/10 1626  AST 14  ALT 7  ALKPHOS 76  BILITOT 0.7  PROT 7.6  ALBUMIN 3.8    Basename 11/06/10 1626  LIPASE 15  AMYLASE --   CBC:  Basename 11/06/10 1626  WBC 8.2  NEUTROABS 5.2  HGB 13.1  HCT 37.6  MCV 88.1  PLT 178   Cardiac Enzymes:  Basename 11/06/10 1626  CKTOTAL 40  CKMB 1.6  CKMBINDEX --  TROPONINI 0.00 (POC)   Lactic Acid, Venous                      1.1               0.5-2.2          mmol/L  Urinanalysis:  Color, Urine                             YELLOW            YELLOW  Appearance                               CLEAR             CLEAR  Specific Gravity                         1.016             1.005-1.030  pH                                       6.0               5.0-8.0  Urine Glucose                            NEGATIVE  NEG              mg/dL  Bilirubin                                NEGATIVE          NEG  Ketones                                  15         a      NEG              mg/dL  Blood                                     TRACE      a      NEG  Protein                                  NEGATIVE          NEG              mg/dL  Urobilinogen                             0.2               0.0-1.0          mg/dL  Nitrite                                  NEGATIVE          NEG  Leukocytes                               SMALL      a      NEG  Urine Micro:  Squamous Epithelial / LPF                RARE              RARE  WBC / HPF                                3-6               <3               WBC/hpf  RBC / HPF                                0-2               <3               RBC/hpf  Bacteria / HPF                           RARE              RARE  Imaging results:  11/06/2010  CHEST - 2 VIEW  Comparison: 10/05/2010 and 03/05/2008.   Findings: Biapical pleural thickening without bony destruction unchanged.  Question nodule right lung base.  This may represent confluence of shadows and can be further evaluated with repeat PA chest with better inspiration or chest CT.  No infiltrate, congestive heart failure or pneumothorax.  Central pulmonary vascular prominence unchanged.  Calcified aorta.  Heart size within normal limits.  Mild scoliosis.   IMPRESSION: Question nodule right lung base.  This may represent confluence of shadows and can be further evaluated with repeat PA chest with better inspiration or chest CT.  No infiltrate, congestive heart failure or pneumothorax.  Central pulmonary vascular prominence unchanged.  Calcified aorta.    11/06/2010   CT ABDOMEN AND PELVIS WITH CONTRAST  Comparison: CT abdomen and pelvis 02/15/2007, 01/14/2006, 01/12/2005, 07/13/2004, 12/20/2003, and 02/06/2003 [facility].   Findings: Edema and inflammation low in the left side of the pelvis, adjacent to the distal sigmoid colon at the rectosigmoid junction in an area of diverticulosis.  No evidence of abscess or extraluminal gas.  Scattered diverticula throughout the remainder of the colon without evidence of  acute diverticulitis elsewhere. Normal-appearing small bowel.  Stomach decompressed and unremarkable by CT.  No ascites.  Normal appearing liver with an anatomic variant in that the left lobe extends well across the midline to the left upper quadrant. Normal spleen and adrenal glands.  New bilobed cystic lesion arising from the tail of the pancreas; remainder of the pancreas unremarkable.  Gallbladder unremarkable by CT.  No biliary ductal dilation.  Bilateral parapelvic renal cysts and scattered small cortical cysts, unchanged; no significant abnormalities involving either kidney. Mild to moderate aorto-iliofemoral atherosclerosis without aneurysm.  No significant lymphadenopathy.  No evidence of omental or peritoneal metastatic disease.  Urinary bladder unremarkable.  Uterus and ovaries surgically absent.  No recurrent pelvic mass.  Surgical clips in the pelvis from previous node dissection.  Numerous pelvic phleboliths.  Visualized lung bases clear.  Heart mildly enlarged.  Bone window images demonstrate osteopenia, degenerative disc disease and spondylosis at L2-3, diffuse lumbar spine facet degenerative changes, but no evidence of osseous metastatic disease.   IMPRESSION:  1.  Acute diverticulitis involving the distal sigmoid colon at the rectosigmoid junction, low in the left side of the pelvis.  No evidence of perforation or abscess. 2.  No evidence of recurrent or metastatic ovarian cancer. 3.  New bilobed cystic lesion arising from the tail of the pancreas, consistent with a macrocystic pancreatic neoplasm.    Assessment & Plan by Problem:  #Left lower quadrant abdominal pain: Given history of pain and accompanying nausea, in addition to CT findings suggests patient is experiencing acute, uncomplicated diverticulitis. Other differentials of LLQ pain include constipation or renal calculi but her symptoms are not compatible with these diagnoses.  Patient has undergone bilateral oophorectomy, and thus an  ovarian process is unlikely.  Though patient was tender to the epigastric region, labs do not suggest pancreatitis, but CT does reveal small pancreatic mass, but no mass effect or evidence of recurrent ovarian Ca.  Though UA reveals small LE & micro reveals rare bacteria and few WBC, patient does not complain of urinary symptoms at this time and is unlikely the etiology of pain. Plan: Treat with cipro & flagyl, pain management with vicodin 5/500, clear liquid diet advanced as tolerated    #Near Syncopal Episodes: Patient has had extensive work up recently.  Had holter monitor placed 10/22 (11am)-10/24 (12pm).  Echo in 2010 revealed EF of 55%.  [facility] office notes suggests possibility of carotid hypersensitivity in 2010.  Also in 2010, carotid doppler was requested to r/o stenosis, but unable to find results in echart, will need to confirm if done with patient. Plan: Admit to tele floor, orthostatic vital signs, PT/OT evaluation, 12 lead EKG, TSH level with AML, obtain medical records from PCP, may consider carotid massage & tilt to further evaluate for carotid hypersensitivity  #New Pancreatic Mass: Found on abdominal CT scan.  Primary team will discuss further options with patient.  If patient desires, further work up with biopsy can be pursued.  LFTs are wnl, so unlikely causing any acute process.  #Hypothyroidism: Patient reports compliance with home medications. Unclear at this point why thyroid was removed. Plan: Continue synthroid 100 mcg daily & obtain TSH level with AML  #Anxiety: Patient denies symptoms of anxiety currently;   Plan: Continue Citalopram 10mg  daily & Diazepam 2mg  qHS  #VTE Prophylaxis: Lovenox 40mg   daily   R3______________________________ (Dr. Nelda Bucks, [phone])   R1________________________________ (Dr. Vernice Jefferson, [phone])   ATTENDING: I performed and/or observed a history and physical examination of the patient.  I discussed the case with the residents  as noted and reviewed the residents' notes.  I agree with the findings and plan--please refer to the attending physician note for more details.  Signature________________________________  Printed Name_____________________________

## 2010-11-06 NOTE — Telephone Encounter (Signed)
Pt's daughter calls b/c pt experienced another one of " her spells" yesterday but "of course she had already turned her monitor in". Daughter thinks Dr. Clarene Duke in [place] ordered the monitor.  Also pt is experiencing left lower abdominal pain that "kept her up all night". Pt still having abd pain and daughter states it is similar to the pain she had when she last had cancer.  Daughter suggested and will take pt to the Urgent care near [facility] as her pcp is out of town.  Mylo Red RN

## 2010-11-07 DIAGNOSIS — R110 Nausea: Secondary | ICD-10-CM

## 2010-11-07 DIAGNOSIS — R1032 Left lower quadrant pain: Secondary | ICD-10-CM

## 2010-11-07 LAB — T4, FREE: Free T4: 1.51 ng/dL (ref 0.80–1.80)

## 2010-11-08 LAB — BASIC METABOLIC PANEL
CO2: 25 mEq/L (ref 19–32)
Calcium: 9.2 mg/dL (ref 8.4–10.5)
Creatinine, Ser: 1.08 mg/dL (ref 0.50–1.10)
Glucose, Bld: 103 mg/dL — ABNORMAL HIGH (ref 70–99)

## 2010-11-14 NOTE — Discharge Summary (Signed)
[name], [name] NO.:  [id number]  MEDICAL RECORD NO.:  [id number]  LOCATION:  4737                         FACILITY:  [facility]  PHYSICIAN:  Cliffton Asters, M.D.    DATE OF BIRTH:  1928-11-07  DATE OF ADMISSION:  11/06/2010 DATE OF DISCHARGE:  11/09/2010                              DISCHARGE SUMMARY   DISCHARGE DIAGNOSES: 1. Acute uncomplicated diverticulitis. 2. Presyncope, etiology unknown, currently being evaluated as an outpatient with [place] [facility]. 3. Pancreatic mass - to be followed up with PCP with MRI gadolinium to be ordered as an outpatient.  DISCONTINUED MEDICATIONS: 1. Ciprofloxacin 500 mg p.o. q.8 hours x7 days. 2. Metronidazole 500 mg p.o. t.i.d. x7 days. 3. Tramadol 50 mg p.o. q.6 hours p.r.n. for pain. 4. Ondansetron 4 mg p.o. q.6 hours p.r.n. nausea. 5. Citalopram 10 mg p.o. daily. 6. Diazepam 2 mg p.o. at bedtime. 7. Levothyroxine 100 mcg p.o. daily before meals.DISPOSITION AND FOLLOWUP:  The patient is to follow up with her primary care physician, Dr. Hortense Edwards on Thursday, November 12, 2010 at 1:30 p.m.  At this time, the patient's abdominal pain, nausea, and loose stools should be reassess to assure resolution.  She will also need to be referred for MRI abdomen with gadolinium contrast for further evaluation of her pancreatic mass with subsequent followup and consultation is indicated after MRI obtained.  This has been discussed with Dr. Clarene Edwards prior to patient's discharge from the hospital.  She will also continue to follow up with her cardiologist Dr. Olga Edwards of [facility] for continued workup of her presyncopal episodes.  PROCEDURES PERFORMED: 1. Chest x-ray, November 06, 2010;     a.     Question nodule, right lung base.  This may represent      confluence of shadows and can be further evaluated with repeat TSH      with better inspiration or chest CT. 2. No infiltrates, congestive heart failure, or  pneumothorax. 3. Central pulmonary vascular prominence, unchanged. 4. Calcified aorta. 5. CT abdomen and pelvis, November 06, 2010;     a.     Acute diverticulitis involving the distal sigmoid colon at      the rectosigmoid junction well in the left side of the pelvis, no      evidence of perforation or abscess.     b.     No evidence of recurrent or metastatic ovarian cancer.     c.     New bilobed cystic lesion arising from the tail of the      pancreas consistent with a macrocystic pancreatic neoplasm.  CONSULTATIONS:  HHOT.  BRIEF ADMITTING HISTORY AND PHYSICAL:  The patient is an 77 year old woman with past medical history significant for ovarian cancer, who presents with left lower quadrant, intermittent weakness, sweating, and nausea for 2 weeks.  She reports a similar pain when she was found to have recurrent ovarian cancer in June 2004.  She reports that she was unable to sleep on the night prior to admission and so she was went to urgent care on the date of admission.  She describes pain is intermittent, sharp, nonradiating, 8-10 at worst, with no alleviating  or exacerbating factors.  During the interview, she is not in any pain and was not given medication in the emergency department.  She reports her nausea worsened after eating 2 peanut butter crackers on the morning of admission.  She has not eaten since.  She denies vomiting, fever, chills, or diarrhea.  Her last bowel movement was on the morning of admission.  It was formed and nonbloody or black.  She endorses decreased appetite over the last few weeks.  She reports that she has a history of diverticulosis, also found on colonoscopy 3 years ago.  In addition to the abdominal pain, the patient reports that she has felt weakness and sweating with spells of presyncope over the last year.  She has never actually lost consciousness, but has it before in the past. She has been extensively worked up, including a recent Holter  monitor from January 02, 2011 to November 04, 2010, no unclear etiology found. She notes she has undergone Holter monitor in the past which was normal. She described episodes of feeling weak when turning her head and subsequently feels hot and flush, feels that her legs are heavy and might fall off from under, does not feel like the room is spinning and has been ruled out for vertigo, episodes only occur when standing and her last episode was at 5 p.m. on the day prior to admission when she was cooking a salmon patty.  ADMITTING PHYSICAL EXAM:  VITAL SIGNS:  Temperature 97.8, heart rate 65, blood pressure 134/62, heart rate 15, O2 saturation 98% on 2 L nasal cannula. GENERAL:  Resting in bed, no apparent distress. HEENT:  Pupils equally round, reactive.  Extraocular muscles intact.  No scleral icterus.  No conjunctival pallor. CARDIAC:  Regular rate and rhythm.  No audible rubs, murmurs, or gallops, but heart tones are difficult to auscultate. PULMONARY:  Clear to auscultation bilaterally.  Moving normal volumes of air.  No wheezes, rales, or rhonchi. ABDOMEN:  Soft, mild tenderness to deep palpation in the epigastric region, nondistended bowel sounds, normoactive. EXTREMITIES:  Warm and well perfused.  No pitting edema.  Palpable pedal pulses. NEUROLOGIC:  Alert and oriented x3.  Cranial nerves II through XII grossly intact.  Strength and sensation to light touch equal in bilateral upper and lower extremities.  ADMITTING LABS:  Sodium 134, potassium 3.9, chloride 96, CO2 27, glucose 96, BUN 9, creatinine 0.62, calcium 10.1.  CBC:  White blood cells 8.2, hemoglobin 13.1, hematocrit 37.6, platelets 178.  LFTs:  AST 14, ALT 17, alk phos/ALKP 7.65, total bili 0.7, protein 7.6, albumin 3.8.  Cardiac enzymes negative.  Urinalysis color yellow, appearance clear, specific gravity 1.016, pH 6.0.  Urine glucose negative, bilirubin negative, ketones 15, blood trace, protein negative,  urobilinogen 0.2, nitrate negative, leukocytes small.  HOSPITAL COURSE BY PROBLEM: 1. Acute and complicated diverticulitis.  On admission, the patient     described 2 weeks of left lower quadrant pain, nausea, and     decreased oral intake, which was concerning for diverticulitis     given her known history of diverticulosis.  CT abdomen was ordered     confirming acute sigmoid diverticular disease.  The patient was     therefore started on ciprofloxacin and Flagyl, which she will     complete a total 7 day course.  (4 days of which will be completed     at home).  She was initially restricted only clear liquid intake     which was slowly transitioned  to a full diet, which the patient was     able to tolerate by the time of discharge.  She was also provided     with supportive measures including gentle IV fluid hydration,     antiemetics, and pain control.  With these interventions, the     patient noted resolution of her nausea, abdominal pain, improving     appetite by the time of discharge.  She is recommended a slowly     progressed diet, a high-fiber diet as tolerated, and to maintain     fluid intake. 2. Presyncope.  The patient has an ongoing problem with presyncope     which continued to be worked up by her cardiologist at [facility]     [facility] and her primary care provider.  There is no acute     episode during this hospital course and she will continue to follow     up as an outpatient for further workup and management.  She will be     discharged home with home health, occupational therapy for     evaluation and assessment of home safety. 3. Pancreatic mass, incidental finding on CT abdomen and pelvis.     Imaging was reviewed with the radiologist who felt that the mass     appeared cystic and slow growing as compared with CT abdomen and     pelvis from three years ago.  According to radiologist, this was     most consistent with a cystic adenoma, although they could not  rule     out a cystic adenocarcinoma based on CT lung.  The patient was     therefore scheduled for an abdominal MRI with gadolinium to better     characterize the mass.  This finding and need for followup was     discussed with the patient and her primary care physician, who both     agree to continued monitoring and followup with an MRI to be     repeated by her primary care provider. 4. Loose stools.  The patient indicated 2 to 3 loose stools during the     hospital course, which are likely multifactorial contributed by a     largely liquid diet, diverticulitis, and GI intolerance to     antibiotics.  This is to be monitored as an outpatient.  The     patient remained without leukocytosis or fevers during hospital     course, therefore, may can see difficile infection less likely.     She was educated on monitoring symptoms and to return to her     primary care provider for further evaluation if symptoms persist     beyond cessation of antibiotics.  DISCHARGE VITAL SIGNS:  Temperature 98.7, heart rate 77, blood pressure 125/64, respiratory rate 18, O2 saturation 97% on room air.  DISCHARGE LABS:  None.    ______________________________ Johnette Abraham, DO   ______________________________ Cliffton Asters, M.D.    SK/MEDQ  D:  11/09/2010  T:  11/09/2010  Job:  [id number]  cc:   Madolyn Frieze. Jens Som, MD, [facility] [name], MD  Electronically Signed by Johnette Abraham DO on 11/13/2010 02:59:25 PM Electronically Signed by Cliffton Asters M.D. on 11/14/2010 01:29:46 PM

## 2010-11-16 ENCOUNTER — Other Ambulatory Visit: Payer: Self-pay | Admitting: Family Medicine

## 2010-11-16 DIAGNOSIS — K862 Cyst of pancreas: Secondary | ICD-10-CM

## 2010-11-17 ENCOUNTER — Ambulatory Visit
Admission: RE | Admit: 2010-11-17 | Discharge: 2010-11-17 | Disposition: A | Discharge status: Home / Self Care | Payer: Medicare Other | Source: Ambulatory Visit | Attending: Family Medicine | Admitting: Family Medicine

## 2010-11-17 DIAGNOSIS — K862 Cyst of pancreas: Secondary | ICD-10-CM

## 2010-11-17 MED ORDER — GADOBENATE DIMEGLUMINE 529 MG/ML IV SOLN
10.0000 mL | Freq: Once | INTRAVENOUS | Status: AC | PRN
  Administered 2010-11-17: 10 mL via INTRAVENOUS

## 2011-01-21 ENCOUNTER — Emergency Department (HOSPITAL_COMMUNITY): Payer: Medicare Other

## 2011-01-21 ENCOUNTER — Inpatient Hospital Stay (HOSPITAL_COMMUNITY)
Admission: EM | Admit: 2011-01-21 | Discharge: 2011-01-25 | DRG: 069 | Disposition: A | Discharge status: Home Health | Payer: Medicare Other | Attending: Internal Medicine | Admitting: Internal Medicine

## 2011-01-21 ENCOUNTER — Other Ambulatory Visit: Payer: Self-pay

## 2011-01-21 ENCOUNTER — Encounter (HOSPITAL_COMMUNITY): Payer: Self-pay | Admitting: Emergency Medicine

## 2011-01-21 DIAGNOSIS — E039 Hypothyroidism, unspecified: Secondary | ICD-10-CM | POA: Diagnosis present

## 2011-01-21 DIAGNOSIS — G459 Transient cerebral ischemic attack, unspecified: Principal | ICD-10-CM | POA: Diagnosis present

## 2011-01-21 DIAGNOSIS — R531 Weakness: Secondary | ICD-10-CM | POA: Diagnosis present

## 2011-01-21 DIAGNOSIS — B961 Klebsiella pneumoniae [K. pneumoniae] as the cause of diseases classified elsewhere: Secondary | ICD-10-CM | POA: Diagnosis present

## 2011-01-21 DIAGNOSIS — N390 Urinary tract infection, site not specified: Secondary | ICD-10-CM | POA: Diagnosis present

## 2011-01-21 DIAGNOSIS — R5383 Other fatigue: Secondary | ICD-10-CM | POA: Diagnosis present

## 2011-01-21 DIAGNOSIS — J4489 Other specified chronic obstructive pulmonary disease: Secondary | ICD-10-CM | POA: Diagnosis present

## 2011-01-21 DIAGNOSIS — Z8543 Personal history of malignant neoplasm of ovary: Secondary | ICD-10-CM

## 2011-01-21 DIAGNOSIS — J449 Chronic obstructive pulmonary disease, unspecified: Secondary | ICD-10-CM | POA: Diagnosis present

## 2011-01-21 DIAGNOSIS — M279 Disease of jaws, unspecified: Secondary | ICD-10-CM

## 2011-01-21 DIAGNOSIS — R42 Dizziness and giddiness: Secondary | ICD-10-CM | POA: Diagnosis present

## 2011-01-21 DIAGNOSIS — R0602 Shortness of breath: Secondary | ICD-10-CM

## 2011-01-21 DIAGNOSIS — R5381 Other malaise: Secondary | ICD-10-CM | POA: Diagnosis present

## 2011-01-21 DIAGNOSIS — Z66 Do not resuscitate: Secondary | ICD-10-CM | POA: Diagnosis present

## 2011-01-21 HISTORY — DX: Unspecified osteoarthritis, unspecified site: M19.90

## 2011-01-21 HISTORY — DX: Bursopathy, unspecified: M71.9

## 2011-01-21 HISTORY — DX: Syncope and collapse: R55

## 2011-01-21 HISTORY — DX: Hypothyroidism, unspecified: E03.9

## 2011-01-21 HISTORY — DX: Insomnia, unspecified: G47.00

## 2011-01-21 HISTORY — DX: Diverticulitis of intestine, part unspecified, without perforation or abscess without bleeding: K57.92

## 2011-01-21 HISTORY — DX: Depression, unspecified: F32.A

## 2011-01-21 HISTORY — DX: Urinary tract infection, site not specified: N39.0

## 2011-01-21 HISTORY — DX: Headache: R51

## 2011-01-21 HISTORY — DX: Anxiety disorder, unspecified: F41.9

## 2011-01-21 HISTORY — DX: Major depressive disorder, single episode, unspecified: F32.9

## 2011-01-21 LAB — CBC
Hemoglobin: 12.3 g/dL (ref 12.0–15.0)
MCV: 88.1 fL (ref 78.0–100.0)
Platelets: 179 10*3/uL (ref 150–400)
RBC: 4.28 MIL/uL (ref 3.87–5.11)
WBC: 4.4 10*3/uL (ref 4.0–10.5)

## 2011-01-21 LAB — URINALYSIS, ROUTINE W REFLEX MICROSCOPIC
Bilirubin Urine: NEGATIVE
Hgb urine dipstick: NEGATIVE
Protein, ur: NEGATIVE mg/dL
Urobilinogen, UA: 0.2 mg/dL (ref 0.0–1.0)

## 2011-01-21 LAB — COMPREHENSIVE METABOLIC PANEL
ALT: 10 U/L (ref 0–35)
Alkaline Phosphatase: 64 U/L (ref 39–117)
CO2: 27 mEq/L (ref 19–32)
GFR calc Af Amer: >90 mL/min (ref >90)
GFR calc non Af Amer: 79 mL/min — ABNORMAL LOW (ref >90)
Glucose, Bld: 84 mg/dL (ref 70–99)
Potassium: 3.7 mEq/L (ref 3.5–5.1)
Sodium: 140 mEq/L (ref 135–145)

## 2011-01-21 LAB — POCT I-STAT, CHEM 8
Creatinine, Ser: 0.7 mg/dL (ref 0.50–1.10)
Glucose, Bld: 87 mg/dL (ref 70–99)
Hemoglobin: 12.6 g/dL (ref 12.0–15.0)
TCO2: 27 mmol/L (ref 0–100)

## 2011-01-21 LAB — GLUCOSE, CAPILLARY

## 2011-01-21 LAB — DIFFERENTIAL
Eosinophils Relative: 1 % (ref 0–5)
Lymphocytes Relative: 40 % (ref 12–46)
Lymphs Abs: 1.7 10*3/uL (ref 0.7–4.0)
Neutrophils Relative %: 48 % (ref 43–77)

## 2011-01-21 LAB — PROTIME-INR: Prothrombin Time: 13.7 seconds (ref 11.6–15.2)

## 2011-01-21 MED ORDER — ALUM & MAG HYDROXIDE-SIMETH 200-200-20 MG/5ML PO SUSP: 30.0000 mL | Freq: Four times a day (QID) | ORAL | Status: DC | PRN

## 2011-01-21 MED ORDER — ENOXAPARIN SODIUM 40 MG/0.4ML ~~LOC~~ SOLN
40.0000 mg | Freq: Every day | SUBCUTANEOUS | Status: DC
  Administered 2011-01-22 – 2011-01-24 (×3): 40 mg via SUBCUTANEOUS
  Filled 2011-01-21 (×4): qty 0.4

## 2011-01-21 MED ORDER — DIAZEPAM 2 MG PO TABS
2.0000 mg | ORAL_TABLET | Freq: Every day | ORAL | Status: DC
  Administered 2011-01-22 – 2011-01-24 (×3): 2 mg via ORAL
  Filled 2011-01-21 (×3): qty 1

## 2011-01-21 MED ORDER — LEVOTHYROXINE SODIUM 100 MCG PO TABS
100.0000 ug | ORAL_TABLET | Freq: Every day | ORAL | Status: DC
  Administered 2011-01-22 – 2011-01-25 (×4): 100 ug via ORAL
  Filled 2011-01-21 (×4): qty 1

## 2011-01-21 MED ORDER — HYDROCODONE-ACETAMINOPHEN 5-325 MG PO TABS: 1.0000 | ORAL_TABLET | ORAL | Status: DC | PRN

## 2011-01-21 MED ORDER — HYDROMORPHONE HCL PF 1 MG/ML IJ SOLN: 1.0000 mg | INTRAMUSCULAR | Status: DC | PRN

## 2011-01-21 MED ORDER — SODIUM CHLORIDE 0.9 % IV SOLN
INTRAVENOUS | Status: DC
  Administered 2011-01-21 – 2011-01-25 (×7): via INTRAVENOUS

## 2011-01-21 MED ORDER — MECLIZINE HCL 25 MG PO TABS
25.0000 mg | ORAL_TABLET | Freq: Three times a day (TID) | ORAL | Status: DC
  Administered 2011-01-21 – 2011-01-24 (×10): 25 mg via ORAL
  Filled 2011-01-21 (×13): qty 1

## 2011-01-21 MED ORDER — ONDANSETRON HCL 4 MG PO TABS: 4.0000 mg | ORAL_TABLET | Freq: Four times a day (QID) | ORAL | Status: DC | PRN

## 2011-01-21 MED ORDER — CITALOPRAM HYDROBROMIDE 10 MG PO TABS
10.0000 mg | ORAL_TABLET | Freq: Every day | ORAL | Status: DC
  Administered 2011-01-22 – 2011-01-25 (×4): 10 mg via ORAL
  Filled 2011-01-21 (×4): qty 1

## 2011-01-21 MED ORDER — TRAMADOL HCL 50 MG PO TABS: 50.0000 mg | ORAL_TABLET | Freq: Four times a day (QID) | ORAL | Status: DC | PRN

## 2011-01-21 MED ORDER — ALBUTEROL SULFATE (5 MG/ML) 0.5% IN NEBU: 2.5000 mg | INHALATION_SOLUTION | Freq: Four times a day (QID) | RESPIRATORY_TRACT | Status: DC | PRN

## 2011-01-21 MED ORDER — ACETAMINOPHEN 325 MG PO TABS: 650.0000 mg | ORAL_TABLET | Freq: Four times a day (QID) | ORAL | Status: DC | PRN

## 2011-01-21 MED ORDER — ONDANSETRON HCL 4 MG/2ML IJ SOLN: 4.0000 mg | Freq: Four times a day (QID) | INTRAMUSCULAR | Status: DC | PRN

## 2011-01-21 MED ORDER — SODIUM CHLORIDE 0.9 % IV BOLUS (SEPSIS): 1000.0000 mL | Freq: Once | INTRAVENOUS | Status: DC

## 2011-01-21 MED ORDER — ACETAMINOPHEN 650 MG RE SUPP: 650.0000 mg | Freq: Four times a day (QID) | RECTAL | Status: DC | PRN

## 2011-01-21 NOTE — ED Notes (Signed)
Patient aware of need for urine testing. Patient unable to void at this time. Patient encouraged to call for toileting assistance. Patient given fluids per request to help her void

## 2011-01-21 NOTE — ED Notes (Signed)
1 seafoam color housecoat and seafoam color gown teal blanket shoes socks

## 2011-01-21 NOTE — ED Notes (Signed)
To ED via [facility] with c/o weakness upon standing, Lives alone, but has someone come in to help

## 2011-01-21 NOTE — ED Notes (Signed)
Called report to floor.  Patient stable and family with her at this time.

## 2011-01-21 NOTE — H&P (Signed)
History and Physical        [name] ZOX:[id number] DOB: 01-08-1929 DOA: 01/21/2011  PCP:  [name], M.D., [facility]  Chief Complaint:  Generalized weakness since morning  HPI: Patient is a 78 year old female with history of COPD, history of ovarian cancer, history of recent diverticulitis in November 2012, presented to the [facility] emergency room with chief complaint of weakness since morning. Patient states that she woke up at 6:30 AM this morning and felt extremely lightheaded and dizzy and was unable to sit up. Patient attempted several times to get out of her bed however was unable to do so. Patient's son lives next door whom she called for assistance. Patient otherwise denied any chest pain or any palpitations or shortness of breath any syncopal episode. Patient states that apart from having a bad headache 5 days ago she had been in her baseline health up to this morning. Patient denied any numbness or tingling, focal weakness today. Patient also states that she had similar spells 3 times in the last 6 months.   Review of Systems:  Constitutional: Denies fever, chills, diaphoresis, appetite change and fatigue.  HEENT: Denies photophobia, eye pain, redness, hearing loss, ear pain, congestion, sore throat, rhinorrhea, sneezing, mouth sores, trouble swallowing,  and tinnitus.  patient has a history of neck pains due to cervical spine issues however currently no neck pain or stiffness   Respiratory: Denies SOB, DOE, cough, chest tightness,  and wheezing.   Cardiovascular: Denies chest pain, palpitations and leg swelling.  Gastrointestinal: Denies nausea, vomiting, abdominal pain, diarrhea, constipation, blood in stool and abdominal distention.  Genitourinary: Denies dysuria, urgency, frequency, hematuria, flank pain and difficulty urinating.  Musculoskeletal:  see history of present illness  Skin: Denies pallor,  rash and wound.  Neurological: see history of present illness  Hematological: Denies adenopathy. Easy bruising, personal or family bleeding history  Psychiatric/Behavioral: Denies suicidal ideation, mood changes, confusion, nervousness, sleep disturbance and agitation  Past Medical History: Past Medical History  Diagnosis Date  . COPD (chronic obstructive pulmonary disease) dx 2012    emphysema  . Diverticulitis   . UTI (lower urinary tract infection)   . 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 Syncope     when neck is up or turned to side  . Hypothyroidism   . Arthritis   . Bursitis   . Depression   . Anxiety   . Insomnia   . Headache     had 01/17/11 and yrs ago   Past Surgical History  Procedure Date  . Thyroidectomy     years ago  . Abdominal hysterectomy   . Eye surgery     cataracts - bilateral  . Dilation and curettage of uterus     several  . Foot surgery     several right foot surgeries age 32  . Tonsillectomy     Medications: Prior to Admission medications   Medication Sig Start Date End Date Taking? Authorizing Provider  citalopram (CELEXA) 10 MG tablet Take 10 mg by mouth daily.   Yes Historical Provider, MD  diazepam (VALIUM) 2 MG tablet Take 1-2 mg by mouth at bedtime as needed. For sleep   Yes Historical Provider, MD  levothyroxine (SYNTHROID, LEVOTHROID) 100 MCG tablet Take 100 mcg by mouth daily.   Yes Historical Provider, MD  ondansetron (ZOFRAN) 4 MG tablet Take 4 mg by mouth every 6 (six) hours as needed.  Yes Historical Provider, MD  traMADol (ULTRAM) 50 MG tablet Take 50 mg by mouth every 6 (six) hours as needed. For pain   Yes Historical Provider, MD    Allergies:   Allergies  Allergen Reactions  . Penicillins Hives and Swelling  . Prednisone Swelling    Social History:  reports that she has never smoked. She has never used smokeless tobacco. She reports that she does not drink alcohol or use illicit  drugs.  Family History: History reviewed. No pertinent family history.  Physical Exam: Blood pressure 136/74, pulse 71, temperature 98.4 F (36.9 C), temperature source Oral, resp. rate 24, SpO2 100.00%. General: Alert, awake, oriented x3, in no acute distress. HEENT: anicteric sclera, pink conjunctiva, pupils equal and reactive to light and accomodation Neck: supple, no masses or lymphadenopathy, no goiter, no bruits  Heart: Regular rate and rhythm, without murmurs, rubs or gallops. Lungs: Clear to auscultation bilaterally, no wheezing, rales or rhonchi. Abdomen: Soft, nontender, nondistended, positive bowel sounds, no masses. Extremities: No clubbing, cyanosis or edema with positive pedal pulses. Neuro: Grossly intact, no focal neurological deficits, strength 5/5 upper and lower extremities bilaterally. No nystagmus noted, Dix-Hallpike test positive  Psych: alert and oriented x 3, normal mood and affect Skin: no rashes or lesions, warm and dry   LABS on Admission:  Basic Metabolic Panel:  Lab 01/21/11 1610 01/21/11 1605  NA 142 140  K 3.8 3.7  CL 106 105  CO2 -- 27  GLUCOSE 87 84  BUN 5* 7  CREATININE 0.70 0.71  CALCIUM -- 9.3  MG -- --  PHOS -- --   Liver Function Tests:  Lab 01/21/11 1605  AST 14  ALT 10  ALKPHOS 64  BILITOT 0.3  PROT 6.7  ALBUMIN 3.6   CBC:  Lab 01/21/11 1616 01/21/11 1605  WBC -- 4.4  NEUTROABS -- 2.1  HGB 12.6 12.3  HCT 37.0 37.7  MCV -- 88.1  PLT -- 179   Cardiac Enzymes:  Lab 01/21/11 1602  CKTOTAL --  CKMB --  CKMBINDEX --  TROPONINI <0.30   CBG:  Lab 01/21/11 1719  GLUCAP 116*     Radiological Exams on Admission: Ct Head Wo Contrast  01/21/2011  *RADIOLOGY REPORT*  Clinical Data: Dizziness and weakness.  CT HEAD WITHOUT CONTRAST  Technique:  Contiguous axial images were obtained from the base of the skull through the vertex without contrast.  Comparison: 04/17/2008  Findings: Bone windows demonstrate clear paranasal  sinuses and mastoid air cells.  Soft tissue windows demonstrate expected cerebral atrophy. Cerebellar atrophy as well. No  mass lesion, hemorrhage, hydrocephalus, acute infarct, intra-axial, or extra-axial fluid collection.  IMPRESSION: Normal head CT for age.  Original Report Authenticated By: Consuello Bossier, M.D.   Mr Brain Wo Contrast  01/21/2011  *RADIOLOGY REPORT*  Clinical Data: Dizziness with generalized weakness and unsteady gait.  MRI HEAD WITHOUT CONTRAST  Technique:  Multiplanar, multiecho pulse sequences of the brain and surrounding structures were obtained according to standard protocol without intravenous contrast.  Comparison: CT head earlier in the day.  CT head 04/17/2008.  Findings: There is no evidence for acute infarction, intracranial hemorrhage, mass lesion, hydrocephalus, or extra-axial fluid.  Mild age related atrophy is present.  Mild white matter the disease suggests chronic microvascular ischemic change.  There are no areas of chronic hemorrhage.  The calvarium appears intact. Pituitary, pineal gland, and cerebellar tonsils are all normal.  Paranasal sinuses are clear.  Bilateral cataract extraction has been performed.  Major intracranial vascular structures appear patent. Compared with prior studies, the appearance is stable.  IMPRESSION: Mild age related atrophy and chronic microvascular ischemic change. There is no acute stroke observed.  Original Report Authenticated By: Elsie Stain, M.D.   Dg Chest Portable 1 View  01/21/2011  *RADIOLOGY REPORT*  Clinical Data: Dizziness and shortness of breath.  PORTABLE CHEST - 1 VIEW  Comparison: Chest x-ray 11/06/2010.  Findings: The cardiac silhouette, mediastinal and hilar contours are within normal limits and stable.  The lungs demonstrate emphysematous changes and areas of scarring but no acute overlying pulmonary process.  No pleural effusion.  The bony thorax is intact.  IMPRESSION: Chronic lung changes but no acute overlying  pulmonary findings.  Original Report Authenticated By: P. Loralie Champagne, M.D.    Assessment/Plan Present on Admission:  .Weakness generalized with possible Vertigo versus TIA: - Admit patient to telemetry floor. MRI was done in the emergency room which was negative for any acute stroke.  - Obtain 2-D echo and carotid Dopplers for further workup, appears that patient may have near syncopal episodes recently. Start meclizine and aspirin for now.  - PTOT evaluation for vertigo.   Marland KitchenHYPOTHYROIDISM: Obtain TSH and continue levothyroxin   .COPD (chronic obstructive pulmonary disease): Currently stable, continue when necessary albuterol inhalers   DVT prophylaxis:  Lovenox   CODE STATUS:  DO NOT RESUSCITATE per patient's wishes   @Time  Spent on Admission: 1 hour   Yovany Clock 01/21/2011, 8:47 PM

## 2011-01-21 NOTE — ED Provider Notes (Signed)
History     CSN: [id number]  Arrival date & time 01/21/11  1417   First MD Initiated Contact with Patient 01/21/11 1502      Chief Complaint  Patient presents with  . Weakness  . orthostatic hypotension     (Consider location/radiation/quality/duration/timing/severity/associated sxs/prior treatment) HPI Patient presents to the emergency room with complaints of weakness. Patient states she woke up this morning and she felt extremely lightheaded and was unable to sit up without getting very dizzy. Patient attempted several times get out of her bed today but was not able to do so. Eventually around noon she was able to get up with some assistance but she had to call 911. Patient states she gets dizzy when she sits up. She denies any problems with any chest pain or shortness of breath. She denies any vomiting or diarrhea or blood in her stool. She denies any focal numbness or weakness. Patient denies fevers chills or cough. She has had these episodes before but never this severe. She's never really had a definitive answer. She did not actually lose consciousness. History reviewed. No pertinent past medical history.  No past surgical history on file.  No family history on file.  History  Substance Use Topics  . Smoking status: Not on file  . Smokeless tobacco: Not on file  . Alcohol Use: Not on file    OB History    Grav Para Term Preterm Abortions TAB SAB Ect Mult Living                  Review of Systems  All other systems reviewed and are negative.    Allergies  Review of patient's allergies indicates not on file.  Home Medications  No current outpatient prescriptions on file.  BP 136/74  Pulse 71  Temp(Src) 98.8 F (37.1 C) (Oral)  Resp 24  SpO2 100%  Physical Exam  Nursing note and vitals reviewed. Constitutional: She appears well-developed and well-nourished. No distress.  HENT:  Head: Normocephalic and atraumatic.  Right Ear: External ear normal.  Left  Ear: External ear normal.  Eyes: Conjunctivae are normal. Right eye exhibits no discharge. Left eye exhibits no discharge. No scleral icterus.  Neck: Neck supple. No tracheal deviation present.  Cardiovascular: Normal rate, regular rhythm and intact distal pulses.   Pulmonary/Chest: Effort normal and breath sounds normal. No stridor. No respiratory distress. She has no wheezes. She has no rales.  Abdominal: Soft. Bowel sounds are normal. She exhibits no distension. There is no tenderness. There is no rebound and no guarding.  Musculoskeletal: She exhibits no edema and no tenderness.  Neurological: She is alert. She has normal strength. No sensory deficit. Cranial nerve deficit:  no gross defecits noted. She exhibits normal muscle tone. She displays no seizure activity. Coordination normal.       Able to hold both hands off the bed without pronator drift although tremulous; able to hold each leg off for 5 seconds,  no facial droop, extraocular movements intact, no visual field cuts   Skin: Skin is warm and dry. No rash noted.  Psychiatric: She has a normal mood and affect.    ED Course  Procedures (including critical care time)  Date: 01/21/2011  Rate: 84  Rhythm: normal sinus rhythm  QRS Axis: normal  Intervals: normal  ST/T Wave abnormalities: normal  Conduction Disutrbances:none  Narrative Interpretation: Possible left atrial abnormality  Old EKG Reviewed: none available   Labs Reviewed  COMPREHENSIVE METABOLIC PANEL - Abnormal; Notable  for the following:    GFR calc non Af Amer 79 (*)    All other components within normal limits  URINALYSIS, ROUTINE W REFLEX MICROSCOPIC - Abnormal; Notable for the following:    APPearance CLOUDY (*)    Ketones, ur TRACE (*)    All other components within normal limits  POCT I-STAT, CHEM 8 - Abnormal; Notable for the following:    BUN 5 (*)    All other components within normal limits  GLUCOSE, CAPILLARY - Abnormal; Notable for the following:      Glucose-Capillary 116 (*)    All other components within normal limits  PROTIME-INR  APTT  CBC  DIFFERENTIAL  TROPONIN I  I-STAT, CHEM 8  POCT CBG MONITORING  TSH  CARDIAC PANEL(CRET KIN+CKTOT+MB+TROPI)  CARDIAC PANEL(CRET KIN+CKTOT+MB+TROPI)  HEMOGLOBIN A1C  URINE CULTURE  BASIC METABOLIC PANEL  CBC  CARDIAC PANEL(CRET KIN+CKTOT+MB+TROPI)   Ct Head Wo Contrast  01/21/2011  *RADIOLOGY REPORT*  Clinical Data: Dizziness and weakness.  CT HEAD WITHOUT CONTRAST  Technique:  Contiguous axial images were obtained from the base of the skull through the vertex without contrast.  Comparison: 04/17/2008  Findings: Bone windows demonstrate clear paranasal sinuses and mastoid air cells.  Soft tissue windows demonstrate expected cerebral atrophy. Cerebellar atrophy as well. No  mass lesion, hemorrhage, hydrocephalus, acute infarct, intra-axial, or extra-axial fluid collection.  IMPRESSION: Normal head CT for age.  Original Report Authenticated By: Consuello Bossier, M.D.   Mr Brain Wo Contrast  01/21/2011  *RADIOLOGY REPORT*  Clinical Data: Dizziness with generalized weakness and unsteady gait.  MRI HEAD WITHOUT CONTRAST  Technique:  Multiplanar, multiecho pulse sequences of the brain and surrounding structures were obtained according to standard protocol without intravenous contrast.  Comparison: CT head earlier in the day.  CT head 04/17/2008.  Findings: There is no evidence for acute infarction, intracranial hemorrhage, mass lesion, hydrocephalus, or extra-axial fluid.  Mild age related atrophy is present.  Mild white matter the disease suggests chronic microvascular ischemic change.  There are no areas of chronic hemorrhage.  The calvarium appears intact. Pituitary, pineal gland, and cerebellar tonsils are all normal.  Paranasal sinuses are clear.  Bilateral cataract extraction has been performed.  Major intracranial vascular structures appear patent. Compared with prior studies, the appearance is  stable.  IMPRESSION: Mild age related atrophy and chronic microvascular ischemic change. There is no acute stroke observed.  Original Report Authenticated By: Elsie Stain, M.D.   Dg Chest Portable 1 View  01/21/2011  *RADIOLOGY REPORT*  Clinical Data: Dizziness and shortness of breath.  PORTABLE CHEST - 1 VIEW  Comparison: Chest x-ray 11/06/2010.  Findings: The cardiac silhouette, mediastinal and hilar contours are within normal limits and stable.  The lungs demonstrate emphysematous changes and areas of scarring but no acute overlying pulmonary process.  No pleural effusion.  The bony thorax is intact.  IMPRESSION: Chronic lung changes but no acute overlying pulmonary findings.  Original Report Authenticated By: P. Loralie Champagne, M.D.     1. Weakness   2. Dizziness       MDM  Patient has persistent weakness. Am concerned about the possibility of stroke with her dizziness. Her symptoms are not definitive for peripheral vertigo. There does not appear to be any evidence of an acute infection. Patient is not anemic. She'll be admitted to the hospital for further evaluation.        Celene Kras, MD 01/22/11 [phone]

## 2011-01-22 DIAGNOSIS — R42 Dizziness and giddiness: Secondary | ICD-10-CM

## 2011-01-22 DIAGNOSIS — I359 Nonrheumatic aortic valve disorder, unspecified: Secondary | ICD-10-CM

## 2011-01-22 LAB — BASIC METABOLIC PANEL
BUN: 8 mg/dL (ref 6–23)
CO2: 25 mEq/L (ref 19–32)
Chloride: 104 mEq/L (ref 96–112)
Glucose, Bld: 91 mg/dL (ref 70–99)
Potassium: 3.7 mEq/L (ref 3.5–5.1)
Sodium: 138 mEq/L (ref 135–145)

## 2011-01-22 LAB — URINE CULTURE

## 2011-01-22 LAB — CARDIAC PANEL(CRET KIN+CKTOT+MB+TROPI)
Relative Index: INVALID (ref 0.0–2.5)
Relative Index: INVALID (ref 0.0–2.5)
Relative Index: INVALID (ref 0.0–2.5)
Total CK: 52 U/L (ref 7–177)
Troponin I: <0.3 ng/mL (ref <0.30)

## 2011-01-22 LAB — CBC
HCT: 36.6 % (ref 36.0–46.0)
Hemoglobin: 12 g/dL (ref 12.0–15.0)
MCV: 87.8 fL (ref 78.0–100.0)
RBC: 4.17 MIL/uL (ref 3.87–5.11)
WBC: 3.7 10*3/uL — ABNORMAL LOW (ref 4.0–10.5)

## 2011-01-22 LAB — TSH: TSH: 0.68 u[IU]/mL (ref 0.350–4.500)

## 2011-01-22 LAB — HEMOGLOBIN A1C
Hgb A1c MFr Bld: 5.8 % — ABNORMAL HIGH (ref <5.7)
Mean Plasma Glucose: 120 mg/dL — ABNORMAL HIGH (ref <117)

## 2011-01-22 NOTE — Progress Notes (Signed)
*  PRELIMINARY RESULTS* Echocardiogram 2D Echocardiogram has been performed.  Glean Salen [facility] 01/22/2011, 9:16 AM

## 2011-01-22 NOTE — Progress Notes (Signed)
Subjective: "I feel much better". Up in chair, visiting with family. Denies pain/discomfort  Objective: Vital signs Filed Vitals:   01/21/11 2248 01/22/11 0509 01/22/11 0511 01/22/11 0512  BP: 118/70 136/62 131/76 129/83  Pulse: 62 66 76 82  Temp: 98.4 F (36.9 C) 98.6 F (37 C)    TempSrc: Oral Oral    Resp: 18 18    Height: 5\' 3"  (1.6 m)     Weight: 55.1 kg (121 lb 7.6 oz)     SpO2: 98% 96% 96% 96%   Weight change:  Last BM Date: 01/21/11  Intake/Output from previous day: 01/10 0701 - 01/11 0700 In: -  Out: 726 [Urine:725; Stool:1] Total I/O In: 120 [P.O.:120] Out: 1 [Stool:1]   Physical Exam: General: Alert, awake, oriented x3, in no acute distress. Up in chair. HEENT: No bruits, no goiter. Heart: Regular rate and rhythm, without murmurs, rubs, gallops. Lungs: Normal effort Clear to auscultation bilaterally. Abdomen: Soft, nontender, nondistended, positive bowel sounds. Extremities: No clubbing cyanosis or edema with positive pedal pulses. Neuro: Grossly intact, nonfocal.    Lab Results: Basic Metabolic Panel:  Basename 01/22/11 0530 01/21/11 1616 01/21/11 1605  NA 138 142 --  K 3.7 3.8 --  CL 104 106 --  CO2 25 -- 27  GLUCOSE 91 87 --  BUN 8 5* --  CREATININE 0.65 0.70 --  CALCIUM 9.2 -- 9.3  MG -- -- --  PHOS -- -- --   Liver Function Tests:  Basename 01/21/11 1605  AST 14  ALT 10  ALKPHOS 64  BILITOT 0.3  PROT 6.7  ALBUMIN 3.6     Basename 01/22/11 0530 01/21/11 1616 01/21/11 1605  WBC 3.7* -- 4.4  NEUTROABS -- -- 2.1  HGB 12.0 12.6 --  HCT 36.6 37.0 --  MCV 87.8 -- 88.1  PLT 182 -- 179   Cardiac Enzymes:  Basename 01/22/11 0530 01/22/11 0013 01/21/11 1602  CKTOTAL 51 52 --  CKMB 1.5 1.6 --  CKMBINDEX -- -- --  TROPONINI <0.30 <0.30 <0.30   CBG:  Basename 01/21/11 1719  GLUCAP 116*   Hemoglobin A1C:  Basename 01/22/11 0013  HGBA1C 5.8*   Fasting Lipid Panel: Thyroid Function Tests:  Basename 01/22/11 0013  TSH  0.680  T4TOTAL --  FREET4 --  T3FREE --  THYROIDAB --   Coagulation:  Basename 01/21/11 1605  LABPROT 13.7  INR 1.03     Studies/Results: Ct Head Wo Contrast  01/21/2011  *RADIOLOGY REPORT*  Clinical Data: Dizziness and weakness.  CT HEAD WITHOUT CONTRAST  Technique:  Contiguous axial images were obtained from the base of the skull through the vertex without contrast.  Comparison: 04/17/2008  Findings: Bone windows demonstrate clear paranasal sinuses and mastoid air cells.  Soft tissue windows demonstrate expected cerebral atrophy. Cerebellar atrophy as well. No  mass lesion, hemorrhage, hydrocephalus, acute infarct, intra-axial, or extra-axial fluid collection.  IMPRESSION: Normal head CT for age.  Original Report Authenticated By: Consuello Bossier, M.D.   Mr Brain Wo Contrast  01/21/2011  *RADIOLOGY REPORT*  Clinical Data: Dizziness with generalized weakness and unsteady gait.  MRI HEAD WITHOUT CONTRAST  Technique:  Multiplanar, multiecho pulse sequences of the brain and surrounding structures were obtained according to standard protocol without intravenous contrast.  Comparison: CT head earlier in the day.  CT head 04/17/2008.  Findings: There is no evidence for acute infarction, intracranial hemorrhage, mass lesion, hydrocephalus, or extra-axial fluid.  Mild age related atrophy is present.  Mild white matter the  disease suggests chronic microvascular ischemic change.  There are no areas of chronic hemorrhage.  The calvarium appears intact. Pituitary, pineal gland, and cerebellar tonsils are all normal.  Paranasal sinuses are clear.  Bilateral cataract extraction has been performed.  Major intracranial vascular structures appear patent. Compared with prior studies, the appearance is stable.  IMPRESSION: Mild age related atrophy and chronic microvascular ischemic change. There is no acute stroke observed.  Original Report Authenticated By: Elsie Stain, M.D.   Dg Chest Portable 1  View  01/21/2011  *RADIOLOGY REPORT*  Clinical Data: Dizziness and shortness of breath.  PORTABLE CHEST - 1 VIEW  Comparison: Chest x-ray 11/06/2010.  Findings: The cardiac silhouette, mediastinal and hilar contours are within normal limits and stable.  The lungs demonstrate emphysematous changes and areas of scarring but no acute overlying pulmonary process.  No pleural effusion.  The bony thorax is intact.  IMPRESSION: Chronic lung changes but no acute overlying pulmonary findings.  Original Report Authenticated By: P. Loralie Champagne, M.D.    Medications: Scheduled Meds:   . citalopram  10 mg Oral Daily  . diazepam  2 mg Oral QHS  . enoxaparin  40 mg Subcutaneous QHS  . levothyroxine  100 mcg Oral QAC breakfast  . meclizine  25 mg Oral TID  . DISCONTD: sodium chloride  1,000 mL Intravenous Once   Continuous Infusions:   . sodium chloride 75 mL/hr at 01/21/11 2310   PRN Meds:.acetaminophen, acetaminophen, albuterol, alum & mag hydroxide-simeth, HYDROcodone-acetaminophen, HYDROmorphone, ondansetron (ZOFRAN) IV, ondansetron, ondansetron, traMADol  Assessment/Plan:  Principal Problem:  .Weakness generalized with likely Vertigo versus TIA: Improved.   MRI was done in the emergency room which was negative for any acute stroke.  -2-D echo results pending. Carotid Dopplers no carotid stenosis. Continue meclizine and aspirin for now.  -  Continue PTOT evaluation for vertigo.   Marland KitchenHYPOTHYROIDISM: TSH 0.68. Continue levothyroxin   .COPD (chronic obstructive pulmonary disease): Currently stable, continue when necessary albuterol inhalers  DVT prophylaxis: Lovenox  Dispo: continue to  Work with PT. Hopefully discharge 24-48hrs.    LOS: 1 day   [facility] M 01/22/2011, 11:24 AM  I have personally examined the patient and reviewed the entire database. Agree with the above note and plan as outlined by Ms. Vedia Coffer, NP.  Thad Ranger M.D. [facility] 01/22/2011, 12:50 PM

## 2011-01-22 NOTE — Progress Notes (Signed)
Occupational Therapy Evaluation Patient Details Name: [name] MRN: [id number] DOB: December 27, 1928 Today's Date: 01/22/2011 10:30-10:54am Ev II; Indirect II Problem List:  Patient Active Problem List  Diagnoses  . HYPOTHYROIDISM  . JAW PAIN  . DYSPNEA  . Weakness generalized  . Vertigo  . COPD (chronic obstructive pulmonary disease)    Past Medical History:  Past Medical History  Diagnosis Date  . COPD (chronic obstructive pulmonary disease) dx 2012    emphysema  . Diverticulitis   . UTI (lower urinary tract infection)   . 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 Syncope     when neck is up or turned to side  . Hypothyroidism   . Arthritis   . Bursitis   . Depression   . Anxiety   . Insomnia   . Headache     had 01/17/11 and yrs ago   Past Surgical History:  Past Surgical History  Procedure Date  . Thyroidectomy     years ago  . Abdominal hysterectomy   . Eye surgery     cataracts - bilateral  . Dilation and curettage of uterus     several  . Foot surgery     several right foot surgeries age 44  . Tonsillectomy     OT Assessment/Plan/Recommendation OT Assessment Clinical Impression Statement: Pt w/ c/o "lighheadedness" after ambulating in hallway today (BP=156/74 HR=84, O2 Sats= 96%). Pt does not c/o (& actually denies) any room spinning, feeling swimmy headed & is also  w/o nystagmus noted at any time during assessment "I am just weak" per pt. Will follow acutely for OT, pt has DME; will cont to assess further for s/s vertigo during session. OT Recommendation/Assessment: Patient will need skilled OT in the acute care venue OT Problem List: Decreased activity tolerance;Decreased knowledge of use of DME or AE;Other (comment) (Intermittent c/o "lightheadedness" per pt report) Barriers to Discharge: Other (comment) (Lives alone) OT Therapy Diagnosis : Generalized weakness OT Plan OT Frequency: Min 2X/week OT Treatment/Interventions:  Self-care/ADL training;Therapeutic activities;DME and/or AE instruction;Patient/family education OT Recommendation Follow Up Recommendations: Home health OT Equipment Recommended: None recommended by OT OT Goals Acute Rehab OT Goals OT Goal Formulation: With patient Time For Goal Achievement: 5 days ADL Goals Pt Will Perform Grooming: with modified independence;Standing at sink;Other (comment) (x2-3 tasks) ADL Goal: Grooming - Progress: Progressing toward goals Pt Will Perform Upper Body Bathing: with modified independence;Sitting at sink;Standing at sink;Sit to stand from chair ADL Goal: Upper Body Bathing - Progress: Progressing toward goals Pt Will Perform Lower Body Bathing: with modified independence;Sitting, edge of bed;Sitting, chair;Sitting at sink ADL Goal: Lower Body Bathing - Progress: Progressing toward goals Pt Will Transfer to Toilet: with modified independence;Comfort height toilet;3-in-1;with DME ADL Goal: Toilet Transfer - Progress: Progressing toward goals Pt Will Perform Toileting - Clothing Manipulation: with modified independence;Standing;Sitting on 3-in-1 or toilet ADL Goal: Toileting - Clothing Manipulation - Progress: Progressing toward goals Pt Will Perform Toileting - Hygiene: with modified independence;Sitting on 3-in-1 or toilet ADL Goal: Toileting - Hygiene - Progress: Progressing toward goals Pt Will Perform Tub/Shower Transfer: with supervision;with min assist;Other (comment) (Min guard assist.) ADL Goal: Tub/Shower Transfer - Progress: Progressing toward goals  OT Evaluation Precautions/Restrictions  Precautions Precautions: Other (comment);Fall (Orthostatic BP testing; DNR) Restrictions Weight Bearing Restrictions: No Prior Functioning Home Living Lives With: Alone;Other (Comment) (Son nearby, checks on her 3-4x/day) Receives Help From: Family Type of Home: House Home Layout: One  level Home Access: Stairs to enter Entrance Stairs-Rails: Can reach  both Entrance Stairs-Number of Steps: 3 STE Bathroom Shower/Tub: Engineer, manufacturing systems: Standard Bathroom Accessibility: Yes How Accessible: Accessible via walker Home Adaptive Equipment: Bedside commode/3-in-1;Walker - rolling Prior Function Level of Independence: Independent with basic ADLs;Independent with transfers;Independent with homemaking with ambulation (Pt reports vaccuuming day before admission) ADL ADL Eating/Feeding: Simulated;Modified independent Where Assessed - Eating/Feeding: Bed level;Chair Grooming: Simulated;Minimal assistance Grooming Details (indicate cue type and reason): Min guard assist; pt c/o feeling weak with initial supine-sit, but agreeable to amb in hall w/ PT/OT Where Assessed - Grooming: Standing at sink Upper Body Bathing: Simulated;Set up;Supervision/safety Upper Body Bathing Details (indicate cue type and reason): Pt w/ c/o "lighheadedness" after ambulating in hallway today (BP=156/74 HR=84, O2 Sats= 96%). Pt does not c/o (& actually denies) any room spinning, feeling swimmy headed & is also  w/o nystagmus noted at any time during assessment "I am just weak" per pt. Where Assessed - Upper Body Bathing: Supported;Sitting, chair;Unsupported Lower Body Bathing: Simulated;Supervision/safety;Set up;Minimal assistance Lower Body Bathing Details (indicate cue type and reason): Min guard sit-stand Where Assessed - Lower Body Bathing: Sitting, chair;Supported;Unsupported;Sit to stand from chair Upper Body Dressing: Performed;Modified independent Where Assessed - Upper Body Dressing: Sitting, bed Lower Body Dressing: Simulated;Supervision/safety;Minimal assistance Lower Body Dressing Details (indicate cue type and reason): Min guard assist sit to stand activities w/ RW Where Assessed - Lower Body Dressing: Sitting, chair;Sitting, bed;Sit to stand from chair;Sit to stand from bed Toilet Transfer: Simulated;Supervision/safety;Minimal assistance Toilet  Transfer Details (indicate cue type and reason): Min guard assist, uses RW Toilet Transfer Method: Proofreader: Bedside commode Toileting - Clothing Manipulation: Not assessed Toileting - Hygiene: Not assessed Tub/Shower Transfer: Not assessed Equipment Used: Rolling walker;Other (comment) (3:1) Ambulation Related to ADLs: Pt is overall Min Guard assist w/ Funct mobility & transfers in room and in hallway, using RW & 3:1 ADL Comments: Pt w/ c/o "lighheadedness" after ambulating in hallway today (BP=156/74 HR=84, O2 Sats= 96%). Pt does not c/o (& actually denies) any room spinning, feeling swimmy headed & is also  w/o nystagmus noted at any time during assessment "I am just weak" per pt. Will follow acutely for OT, pt has DME. Vision/Perception  Vision - History Baseline Vision: No visual deficits Patient Visual Report: No change from baseline;Other (comment) (Neg. Nausea/blurring/room spinning etc w/ head mvt; pt does c/o being "Light headed" w/ initial supine-sit however) Vision - Assessment Eye Alignment: Within Functional Limits (Neg Nystagmus noted throughout session) Additional Comments: Pt w/o noted nystagmus noted during session w/ position change (supine-sit) or head movement. Will cont to further assess. Pt does have c/o "Light headedness" however w/ initial sit & after ambulating to end of hallway w/ PT/OT Cognition Cognition Arousal/Alertness: Awake/alert Overall Cognitive Status: Appears within functional limits for tasks assessed Orientation Level: Oriented X4 Sensation/Coordination Sensation Light Touch: Appears Intact Coordination Gross Motor Movements are Fluid and Coordinated: Yes Extremity Assessment RUE Assessment RUE Assessment: Within Functional Limits LUE Assessment LUE Assessment: Within Functional Limits Mobility  Bed Mobility Bed Mobility: Yes Supine to Sit: HOB elevated (Comment degrees);With rails Supine to Sit Details (indicate  cue type and reason): Min-guard assist. Increased time. HOB 45 degrees. Pt c/o "lightheadedness" with change in position. Resolved after sitting for 1-2 minutes.  Transfers Sit to Stand Details (indicate cue type and reason): Min-guard assist. VCs safety, hand placement. Stood at [facility] for about 1 minute to capure blood pressure reading before beginning ambulation.  End of Session OT - End of Session Equipment Utilized During Treatment: Gait belt;Other (comment) (RW; 3:1) Activity Tolerance: Patient limited by fatigue Patient left: in chair;with call bell in reach;with family/visitor present General Behavior During Session: [facility] for tasks performed Cognition: [facility] for tasks performed   Alm Bustard 01/22/2011, 12:20 PM

## 2011-01-22 NOTE — Progress Notes (Signed)
*  PRELIMINARY RESULTS* Carotid duplex  has been performed.  Bilateral:  No evidence of hemodynamically significant internal carotid artery stenosis.   Vertebral artery flow is antegrade.      [name] 01/22/2011, 9:26 AM

## 2011-01-22 NOTE — Progress Notes (Signed)
Physical Therapy Evaluation Patient Details Name: [name] MRN: [id number] DOB: August 06, 1928 Today's Date: 01/22/2011 Time: 4098-1191  Eval II Problem List:  Patient Active Problem List  Diagnoses  . HYPOTHYROIDISM  . JAW PAIN  . DYSPNEA  . Weakness generalized  . Vertigo  . COPD (chronic obstructive pulmonary disease)    Past Medical History:  Past Medical History  Diagnosis Date  . COPD (chronic obstructive pulmonary disease) dx 2012    emphysema  . Diverticulitis   . UTI (lower urinary tract infection)   . 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 Syncope     when neck is up or turned to side  . Hypothyroidism   . Arthritis   . Bursitis   . Depression   . Anxiety   . Insomnia   . Headache     had 01/17/11 and yrs ago   Past Surgical History:  Past Surgical History  Procedure Date  . Thyroidectomy     years ago  . Abdominal hysterectomy   . Eye surgery     cataracts - bilateral  . Dilation and curettage of uterus     several  . Foot surgery     several right foot surgeries age 78  . Tonsillectomy     PT Assessment/Plan/Recommendation PT Assessment Clinical Impression Statement: Pt presents with diagnosis of TIA vs vertigo. Pt c/o lightheadedness during session-denies spinning. No nystagmus noted. Pt states she feels "swimmy headed and weak." Will discuss need for BPPV evaluation with vestibular specialist. Pt will benefit from skilled PT in acute care setting to maximize independence and safety with basic functional mobility in preperation for D/C PT Recommendation/Assessment: Patient will need skilled PT in the acute care venue PT Problem List: Decreased strength;Decreased mobility;Decreased activity tolerance;Decreased safety awareness Barriers to Discharge: Decreased caregiver support Barriers to Discharge Comments: Son checks in on pt but otherwise pt is alone PT Therapy Diagnosis : Difficulty walking;Generalized weakness PT  Plan PT Frequency: Min 3X/week PT Treatment/Interventions: DME instruction;Gait training;Stair training;Functional mobility training;Therapeutic activities;Therapeutic exercise;Patient/family education PT Recommendation Recommendations for Other Services: OT consult (? need for vestibular eval) Follow Up Recommendations: Home health PT Equipment Recommended: None recommended by PT PT Goals  Acute Rehab PT Goals PT Goal Formulation: With patient Time For Goal Achievement: 7 days Pt will go Supine/Side to Sit: with modified independence PT Goal: Supine/Side to Sit - Progress: Not met Pt will go Sit to Supine/Side: with modified independence PT Goal: Sit to Supine/Side - Progress: Not met Pt will go Sit to Stand: with modified independence PT Goal: Sit to Stand - Progress: Not met Pt will Ambulate: with modified independence;51 - 150 feet;with least restrictive assistive device PT Goal: Ambulate - Progress: Not met Pt will Go Up / Down Stairs: 3-5 stairs;with rail(s);with supervision PT Goal: Up/Down Stairs - Progress: Not met  PT Evaluation Precautions/Restrictions  Precautions Precautions: Other (comment);Fall (Orthostatic BP testing; DNR) Restrictions Weight Bearing Restrictions: No Prior Functioning  Home Living Lives With: Alone;Other (Comment) (Son nearby, checks on her 3-4x/day) Receives Help From: Family Type of Home: House Home Layout: One level Home Access: Stairs to enter Entrance Stairs-Rails: Can reach both Entrance Stairs-Number of Steps: 3 STE Bathroom Shower/Tub: Engineer, manufacturing systems: Standard Bathroom Accessibility: Yes How Accessible: Accessible via walker Home Adaptive Equipment: Bedside commode/3-in-1;Walker - rolling Prior Function Level of Independence: Independent with basic ADLs;Independent with transfers;Independent with homemaking with ambulation (Pt reports vaccuuming day  before admission) Cognition Cognition Arousal/Alertness:  Awake/alert Overall Cognitive Status: Appears within functional limits for tasks assessed Orientation Level: Oriented X4 Sensation/Coordination Sensation Light Touch: Appears Intact Coordination Gross Motor Movements are Fluid and Coordinated: Yes Extremity Assessment RLE Assessment RLE Assessment: Within Functional Limits LLE Assessment LLE Assessment: Within Functional Limits Mobility (including Balance) Bed Mobility Bed Mobility: Yes Supine to Sit: HOB elevated (Comment degrees);With rails Supine to Sit Details (indicate cue type and reason): Min-guard assist. Increased time. HOB 45 degrees. Pt c/o "lightheadedness" with change in position. Resolved after sitting for 1-2 minutes.  Transfers Transfers: Yes Sit to Stand Details (indicate cue type and reason): Min-guard assist. VCs safety, hand placement. Stood at [facility] for about 1 minute to capure blood pressure reading before beginning ambulation.  Ambulation/Gait Ambulation/Gait: Yes Ambulation/Gait Assistance Details (indicate cue type and reason): Min-guard assist. VCs safety. Slow gait speed (pt cautious). c/o "lightheadedness" during ambulation. Towards end of ambulation pt stated "I should probably turn around (due to lightheadedness). Recliner brought up behind pt to sit.  Ambulation Distance (Feet): 40 Feet Assistive device: Rolling walker Gait Pattern: Step-through pattern;Decreased step length - right;Decreased step length - left;Decreased stride length  Posture/Postural Control Posture/Postural Control: No significant limitations Balance Balance Assessed: No Exercise    End of Session PT - End of Session Equipment Utilized During Treatment: Gait belt Activity Tolerance:  (Limited by lightheadedness, general weakness) Patient left: in chair;with call bell in reach;with family/visitor present General Behavior During Session: [facility] for tasks performed Cognition: [facility] for tasks performed  Rebeca Alert  [facility] 01/22/2011, 2:34 PM [phone]

## 2011-01-23 MED ORDER — DEXTROSE 5 % IV SOLN
1.0000 g | INTRAVENOUS | Status: DC
  Administered 2011-01-23 – 2011-01-24 (×2): 1 g via INTRAVENOUS
  Filled 2011-01-23 (×4): qty 10

## 2011-01-23 NOTE — Progress Notes (Signed)
Subjective: Not back to her baseline, overall feels much better..  Objective: Vital signs Filed Vitals:   01/22/11 1030 01/22/11 1349 01/22/11 2219 01/23/11 0514  BP: 152/77 133/74 131/68 144/68  Pulse: 60 67 64 61  Temp:  98.4 F (36.9 C) 98.2 F (36.8 C) 98.3 F (36.8 C)  TempSrc:  Oral Oral Oral  Resp:  18 20 18   Height:      Weight:      SpO2: 96% 96% 96% 98%   Weight change:  Last BM Date: 01/21/11  Intake/Output from previous day: 01/11 0701 - 01/12 0700 In: 2987.5 [P.O.:600; I.V.:2387.5] Out: 2751 [Urine:2750; Stool:1]     Physical Exam: General: Alert, awake, oriented x3, in no acute distress. Up in chair. HEENT: No bruits, no goiter. Heart: Regular rate and rhythm, without murmurs, rubs, gallops. Lungs: Normal effort Clear to auscultation bilaterally. Abdomen: Soft, nontender, nondistended, positive bowel sounds. Extremities: No clubbing cyanosis or edema with positive pedal pulses. Neuro: Grossly intact, nonfocal.    Lab Results: Basic Metabolic Panel:  Basename 01/22/11 0530 01/21/11 1616 01/21/11 1605  NA 138 142 --  K 3.7 3.8 --  CL 104 106 --  CO2 25 -- 27  GLUCOSE 91 87 --  BUN 8 5* --  CREATININE 0.65 0.70 --  CALCIUM 9.2 -- 9.3  MG -- -- --  PHOS -- -- --   Liver Function Tests:  Basename 01/21/11 1605  AST 14  ALT 10  ALKPHOS 64  BILITOT 0.3  PROT 6.7  ALBUMIN 3.6     Basename 01/22/11 0530 01/21/11 1616 01/21/11 1605  WBC 3.7* -- 4.4  NEUTROABS -- -- 2.1  HGB 12.0 12.6 --  HCT 36.6 37.0 --  MCV 87.8 -- 88.1  PLT 182 -- 179   Cardiac Enzymes:  Basename 01/22/11 1113 01/22/11 0530 01/22/11 0013  CKTOTAL 48 51 52  CKMB 1.6 1.5 1.6  CKMBINDEX -- -- --  TROPONINI <0.30 <0.30 <0.30   CBG:  Basename 01/21/11 1719  GLUCAP 116*   Hemoglobin A1C:  Basename 01/22/11 0013  HGBA1C 5.8*   Fasting Lipid Panel: Thyroid Function Tests:  Basename 01/22/11 0013  TSH 0.680  T4TOTAL --  FREET4 --  T3FREE --  THYROIDAB  --   Coagulation:  Basename 01/21/11 1605  LABPROT 13.7  INR 1.03     Studies/Results: Ct Head Wo Contrast  01/21/2011  *RADIOLOGY REPORT*  Clinical Data: Dizziness and weakness.  CT HEAD WITHOUT CONTRAST  Technique:  Contiguous axial images were obtained from the base of the skull through the vertex without contrast.  Comparison: 04/17/2008  Findings: Bone windows demonstrate clear paranasal sinuses and mastoid air cells.  Soft tissue windows demonstrate expected cerebral atrophy. Cerebellar atrophy as well. No  mass lesion, hemorrhage, hydrocephalus, acute infarct, intra-axial, or extra-axial fluid collection.  IMPRESSION: Normal head CT for age.  Original Report Authenticated By: Consuello Bossier, M.D.   Mr Brain Wo Contrast  01/21/2011  *RADIOLOGY REPORT*  Clinical Data: Dizziness with generalized weakness and unsteady gait.  MRI HEAD WITHOUT CONTRAST  Technique:  Multiplanar, multiecho pulse sequences of the brain and surrounding structures were obtained according to standard protocol without intravenous contrast.  Comparison: CT head earlier in the day.  CT head 04/17/2008.  Findings: There is no evidence for acute infarction, intracranial hemorrhage, mass lesion, hydrocephalus, or extra-axial fluid.  Mild age related atrophy is present.  Mild white matter the disease suggests chronic microvascular ischemic change.  There are no areas of chronic  hemorrhage.  The calvarium appears intact. Pituitary, pineal gland, and cerebellar tonsils are all normal.  Paranasal sinuses are clear.  Bilateral cataract extraction has been performed.  Major intracranial vascular structures appear patent. Compared with prior studies, the appearance is stable.  IMPRESSION: Mild age related atrophy and chronic microvascular ischemic change. There is no acute stroke observed.  Original Report Authenticated By: Elsie Stain, M.D.   Dg Chest Portable 1 View  01/21/2011  *RADIOLOGY REPORT*  Clinical Data: Dizziness and  shortness of breath.  PORTABLE CHEST - 1 VIEW  Comparison: Chest x-ray 11/06/2010.  Findings: The cardiac silhouette, mediastinal and hilar contours are within normal limits and stable.  The lungs demonstrate emphysematous changes and areas of scarring but no acute overlying pulmonary process.  No pleural effusion.  The bony thorax is intact.  IMPRESSION: Chronic lung changes but no acute overlying pulmonary findings.  Original Report Authenticated By: P. Loralie Champagne, M.D.    Medications: Scheduled Meds:    . citalopram  10 mg Oral Daily  . diazepam  2 mg Oral QHS  . enoxaparin  40 mg Subcutaneous QHS  . levothyroxine  100 mcg Oral QAC breakfast  . meclizine  25 mg Oral TID   Continuous Infusions:    . sodium chloride 75 mL/hr at 01/23/11 0106   PRN Meds:.acetaminophen, acetaminophen, albuterol, alum & mag hydroxide-simeth, HYDROcodone-acetaminophen, HYDROmorphone, ondansetron (ZOFRAN) IV, ondansetron, ondansetron, traMADol  Assessment/Plan:  Principal Problem:  .Weakness generalized with likely Vertigo versus TIA: Improved.   MRI was done in the emergency room which was negative for any acute stroke.  -2-D echo results pending. Carotid Dopplers no carotid stenosis. Continue meclizine and aspirin for now.  - Continue PTOT evaluation for vertigo.   Marland KitchenHYPOTHYROIDISM: TSH 0.68. Continue levothyroxin   .COPD (chronic obstructive pulmonary disease): Currently stable, continue when necessary albuterol inhalers.   UTI: Gram-negative rods. Will start patient on antibiotic till the cultures comes back     LOS: 2 days   Jolissa Kapral A 01/23/2011, 8:43 AM

## 2011-01-24 MED ORDER — POTASSIUM CHLORIDE CRYS ER 20 MEQ PO TBCR
40.0000 meq | EXTENDED_RELEASE_TABLET | Freq: Once | ORAL | Status: AC
  Administered 2011-01-24: 40 meq via ORAL
  Filled 2011-01-24: qty 2

## 2011-01-24 NOTE — Progress Notes (Signed)
Subjective: Not back to her baseline, fe  Objective: Vital signs Filed Vitals:   01/23/11 0514 01/23/11 1300 01/23/11 2208 01/24/11 0522  BP: 144/68 111/66 126/70 144/78  Pulse: 61 70 60 62  Temp: 98.3 F (36.8 C) 99.2 F (37.3 C) 98.6 F (37 C) 98 F (36.7 C)  TempSrc: Oral Oral Oral Oral  Resp: 18 18 16 18   Height:      Weight:      SpO2: 98% 96% 98% 99%   Weight change:  Last BM Date: 01/21/11  Intake/Output from previous day: 01/12 0701 - 01/13 0700 In: 2151.3 [P.O.:680; I.V.:1471.3] Out: 1958 [Urine:1958] Total I/O In: 241 [P.O.:241] Out: -    Physical Exam: General: Alert, awake, oriented x3, in no acute distress. Up in chair. HEENT: No bruits, no goiter. Heart: Regular rate and rhythm, without murmurs, rubs, gallops. Lungs: Normal effort Clear to auscultation bilaterally. Abdomen: Soft, nontender, nondistended, positive bowel sounds. Extremities: No clubbing cyanosis or edema with positive pedal pulses. Neuro: Grossly intact, nonfocal.    Lab Results: Basic Metabolic Panel:  Basename 01/22/11 0530 01/21/11 1616 01/21/11 1605  NA 138 142 --  K 3.7 3.8 --  CL 104 106 --  CO2 25 -- 27  GLUCOSE 91 87 --  BUN 8 5* --  CREATININE 0.65 0.70 --  CALCIUM 9.2 -- 9.3  MG -- -- --  PHOS -- -- --   Liver Function Tests:  Basename 01/21/11 1605  AST 14  ALT 10  ALKPHOS 64  BILITOT 0.3  PROT 6.7  ALBUMIN 3.6     Basename 01/22/11 0530 01/21/11 1616 01/21/11 1605  WBC 3.7* -- 4.4  NEUTROABS -- -- 2.1  HGB 12.0 12.6 --  HCT 36.6 37.0 --  MCV 87.8 -- 88.1  PLT 182 -- 179   Cardiac Enzymes:  Basename 01/22/11 1113 01/22/11 0530 01/22/11 0013  CKTOTAL 48 51 52  CKMB 1.6 1.5 1.6  CKMBINDEX -- -- --  TROPONINI <0.30 <0.30 <0.30   CBG:  Basename 01/21/11 1719  GLUCAP 116*   Hemoglobin A1C:  Basename 01/22/11 0013  HGBA1C 5.8*   Fasting Lipid Panel: Thyroid Function Tests:  Basename 01/22/11 0013  TSH 0.680  T4TOTAL --  FREET4 --    T3FREE --  THYROIDAB --   Coagulation:  Basename 01/21/11 1605  LABPROT 13.7  INR 1.03     Studies/Results: No results found.  Medications: Scheduled Meds:    . cefTRIAXone (ROCEPHIN)  IV  1 g Intravenous Q24H  . citalopram  10 mg Oral Daily  . diazepam  2 mg Oral QHS  . enoxaparin  40 mg Subcutaneous QHS  . levothyroxine  100 mcg Oral QAC breakfast  . meclizine  25 mg Oral TID  . potassium chloride  40 mEq Oral Once   Continuous Infusions:    . sodium chloride 75 mL/hr at 01/24/11 0151   PRN Meds:.acetaminophen, acetaminophen, albuterol, alum & mag hydroxide-simeth, ondansetron (ZOFRAN) IV, ondansetron, ondansetron, DISCONTD: HYDROcodone-acetaminophen, DISCONTD: HYDROmorphone, DISCONTD: traMADol  Assessment/Plan:  Principal Problem:  .Weakness generalized with likely Vertigo versus TIA: Improved. MRI was done in the emergency room which was negative for any acute stroke.  -2-D echo  . Carotid Dopplers no carotid stenosis. Continue meclizine and aspirin for now.  - Continue PTOT evaluation for vertigo.   Marland KitchenHYPOTHYROIDISM: TSH 0.68. Continue levothyroxin   .COPD (chronic obstructive pulmonary disease): Currently stable, continue when necessary albuterol inhalers.   UTI: Klebsiella UTI continue the Rocephin, switch to oral Cipro at  D/C.      LOS: 3 days   Taliesin Hartlage A 01/24/2011, 9:34 AM

## 2011-01-25 MED ORDER — CIPROFLOXACIN HCL 500 MG PO TABS
500.0000 mg | ORAL_TABLET | Freq: Two times a day (BID) | ORAL | Status: DC
  Administered 2011-01-25: 500 mg via ORAL
  Filled 2011-01-25 (×3): qty 1

## 2011-01-25 MED ORDER — CIPROFLOXACIN HCL 500 MG PO TABS: 500.0000 mg | ORAL_TABLET | Freq: Two times a day (BID) | ORAL | Status: AC

## 2011-01-25 MED ORDER — MECLIZINE HCL 25 MG PO TABS: 25.0000 mg | ORAL_TABLET | Freq: Four times a day (QID) | ORAL | Status: AC | PRN

## 2011-01-25 MED ORDER — MECLIZINE HCL 25 MG PO TABS
25.0000 mg | ORAL_TABLET | Freq: Four times a day (QID) | ORAL | Status: DC
  Administered 2011-01-25 (×2): 25 mg via ORAL
  Filled 2011-01-25 (×4): qty 1

## 2011-01-25 NOTE — Progress Notes (Signed)
Pt discharge to home with transportation from daughter.  Denies pain and nausea.  Dizziness present but patient has stable gait with walker assistance.  Pt has walker at home.  Discussed discharge instructions with patient.  Patient verbalized understanding.  Pt has all belongings.  No other concerns at this time.  [name] [id number] 01/25/2011

## 2011-01-25 NOTE — Progress Notes (Signed)
01-25-11 Spoke with Ms. Auvil and son at bedside to discuss dc plans. Patient agreeable to [facility] PT services. Chose [facility]. PCP; Dr. [name]. Reports having beside commode and walker at home. Norberta Keens aware of referral for PT services. No further needs assessed.  [address] RN,BSN, [place]  [phone]

## 2011-01-25 NOTE — Progress Notes (Signed)
Occupational Therapy Treatment Patient Details Name: [name] MRN: [id number] DOB: [birth year] Today's Date: 01/25/2011 1355 1420 OT Assessment/Plan   OT Goals    OT Treatment Precautions/Restrictions      ADL ADL ADL Comments: Pt with orthostatic hypotension:  sit 143 systolic; stand 107.Marland KitchenMarland Kitchenpt asymptomatic.  See vestibular eval in shadow chart.  Vitals on page 2 Mobility    Exercises Other Exercises Other Exercises: completed vestibular eval:  Pt with R BPPV.Marland KitchenMarland Kitchenperformed Epley maneuver  End of Session OT - End of Session Activity Tolerance: Patient tolerated treatment well Patient left: in bed;with call bell in reach;with family/visitor present Nurse Communication: Other (comment) (spoke to NP about vestibular OT/PT follow up hh; orthostatic) [name], OTR/L [phone] 01/25/2011  [name]  01/25/2011, 3:02 PM

## 2011-01-25 NOTE — Progress Notes (Signed)
Occupational Therapy Treatment Patient Details Name: KALLIOPE RIESEN MRN: [id number] DOB: 1928-07-01 Today's Date: 01/25/2011 1430 1443 1ta  OT Assessment/Plan OT Assessment/Plan OT Plan: Discharge plan remains appropriate;Other (comment) (home with 24/7) Follow Up Recommendations: Home health OT;Other (comment) (PT or OT with vestibular training) OT Goals Miscellaneous OT Goals Miscellaneous OT Goal #1: pt/family will be independent with brandt-daroff exercises OT Goal: Miscellaneous Goal #1 - Progress: Progressing toward goals Miscellaneous OT Goal #2: Pt/family will verbalize/demonstrate strategies for orthostatic hypotension OT Goal: Miscellaneous Goal #2 - Progress: Progressing toward goals  OT Treatment Precautions/Restrictions      ADL ADL ADL Comments: educated pt & daughter on R BPPV and Brandt-Daroff exercises.  handout given with my phone number.  Also discussed strategies for orthostatic hypotension.  Daughter verbalizes.  Will need HHOT/PT with vestibular training to follow pt. Mobility    Exercises Other Exercises Other Exercises: completed vestibular eval:  Pt with R BPPV.Marland KitchenMarland Kitchenperformed Epley maneuver  End of Session OT - End of Session Activity Tolerance: Patient tolerated treatment well Patient left: in bed;with call bell in reach;with family/visitor present Nurse Communication: Other (comment) (spoke to NP about vestibular OT/PT follow up hh; orthostatic)  Marica Otter, OTR/L [phone] 01/25/2011 Arta Stump   01/25/2011, 3:12 PM

## 2011-01-25 NOTE — Progress Notes (Signed)
Subjective: "I am still dizzy and do not feel ready to go home" Denied pain/nausea  Objective: Vital signs Filed Vitals:   01/24/11 0522 01/24/11 1417 01/24/11 2153 01/25/11 0608  BP: 144/78 116/69 109/63 130/76  Pulse: 62 65 60 60  Temp: 98 F (36.7 C) 97.5 F (36.4 C) 98.6 F (37 C) 98.8 F (37.1 C)  TempSrc: Oral Oral Oral Oral  Resp: 18 20 16 18   Height:      Weight:      SpO2: 99% 97% 98% 96%   Weight change:  Last BM Date: 01/22/11  Intake/Output from previous day: 01/13 0701 - 01/14 0700 In: 2962.3 [P.O.:721; I.V.:2241.3] Out: 2925 [Urine:2925] Total I/O In: 120 [P.O.:120] Out: -    Physical Exam: General: Alert, awake, oriented x3, in no acute distress. HEENT: No bruits, no goiter. Heart: Regular rate and rhythm, without murmurs, rubs, gallops. Lungs:  Normal effort. Breath sounds clear to auscultation bilaterally. Abdomen: Soft, nontender, nondistended, positive bowel sounds. Extremities: No clubbing cyanosis or edema with positive pedal pulses. Neuro: Grossly intact, nonfocal.    Lab Results: Basic Metabolic Panel: No results found for this basename: NA:2,K:2,CL:2,CO2:2,GLUCOSE:2,BUN:2,CREATININE:2,CALCIUM:2,MG:2,PHOS:2 in the last 72 hours Liver Function Tests: No results found for this basename: AST:2,ALT:2,ALKPHOS:2,BILITOT:2,PROT:2,ALBUMIN:2 in the last 72 hours No results found for this basename: LIPASE:2,AMYLASE:2 in the last 72 hours No results found for this basename: AMMONIA:2 in the last 72 hours CBC: No results found for this basename: WBC:2,NEUTROABS:2,HGB:2,HCT:2,MCV:2,PLT:2 in the last 72 hours Cardiac Enzymes:  Basename 01/22/11 1113  CKTOTAL 48  CKMB 1.6  CKMBINDEX --  TROPONINI <0.30   BNP: No results found for this basename: PROBNP:3 in the last 72 hours D-Dimer: No results found for this basename: DDIMER:2 in the last 72 hours CBG: No results found for this basename: GLUCAP:6 in the last 72 hours Hemoglobin A1C: No  results found for this basename: HGBA1C in the last 72 hours Fasting Lipid Panel: No results found for this basename: CHOL,HDL,LDLCALC,TRIG,CHOLHDL,LDLDIRECT in the last 72 hours Thyroid Function Tests: No results found for this basename: TSH,T4TOTAL,FREET4,T3FREE,THYROIDAB in the last 72 hours Anemia Panel: No results found for this basename: VITAMINB12,FOLATE,FERRITIN,TIBC,IRON,RETICCTPCT in the last 72 hours Coagulation: No results found for this basename: LABPROT:2,INR:2 in the last 72 hours Urine Drug Screen: Drugs of Abuse  No results found for this basename: labopia, cocainscrnur, labbenz, amphetmu, thcu, labbarb    Alcohol Level: No results found for this basename: ETH:2 in the last 72 hours Urinalysis:  Misc. Labs:  Recent Results (from the past 240 hour(s))  URINE CULTURE     Status: Normal   Collection Time   01/22/11  5:23 AM      Component Value Range Status Comment   Specimen Description URINE, RANDOM   Final    Special Requests NONE   Final    Setup Time [id number]   Final    Colony Count >=100,000 COLONIES/ML   Final    Culture KLEBSIELLA PNEUMONIAE   Final    Report Status 01/23/2011 FINAL   Final    Organism ID, Bacteria KLEBSIELLA PNEUMONIAE   Final     Studies/Results: No results found.  Medications: Scheduled Meds:   . ciprofloxacin  500 mg Oral BID  . citalopram  10 mg Oral Daily  . diazepam  2 mg Oral QHS  . enoxaparin  40 mg Subcutaneous QHS  . levothyroxine  100 mcg Oral QAC breakfast  . meclizine  25 mg Oral TID  . potassium chloride  40 mEq Oral  Once  . DISCONTD: cefTRIAXone (ROCEPHIN)  IV  1 g Intravenous Q24H   Continuous Infusions:   . sodium chloride 100 mL/hr at 01/25/11 0813   PRN Meds:.acetaminophen, acetaminophen, albuterol, alum & mag hydroxide-simeth, ondansetron (ZOFRAN) IV, ondansetron, ondansetron, DISCONTD: HYDROcodone-acetaminophen, DISCONTD: HYDROmorphone, DISCONTD: traMADol  Assessment/Plan:  Principal Problem:   Weakness generalized with likely Vertigo versus TIA: Improved. MRI was done in the emergency room which was negative for any acute stroke.  -2-D echo . Carotid Dopplers no carotid stenosis. Continue meclizine and aspirin for now.  - Continue PTOT evaluation for vertigo. Remains dizzy with head movement. Ambulates with assistance x1. Continue ^ meclizine.   Marland KitchenHYPOTHYROIDISM: TSH 0.68. Continue levothyroxin   .COPD (chronic obstructive pulmonary disease): Currently stable, continue when necessary albuterol inhalers.  UTI: Klebsiella UTI continue the Rocephin, switch to oral Cipro . Dispo: likely discharge later today once arrangements made for assistance at home.       LOS: 4 days   [facility] M 01/25/2011, 9:02 AM

## 2011-01-25 NOTE — Progress Notes (Signed)
Dr. Arthor Captain informed via text page of NP's desire for him to see patient before she can be discharged today.  Albesa Seen E 12:00 PM 01/25/2011

## 2011-01-25 NOTE — Progress Notes (Signed)
UR completed 

## 2011-01-25 NOTE — Progress Notes (Addendum)
Addendum  Patient seen and examined, chart and data base reviewed.  I agree with the above assessment and plan to be discharged home today.  For full details please see Mrs. Toya Smothers NP note.  UTI, acute on chronic dizziness.  Clint Lipps Pager: [phone] 01/25/2011, 12:14 PM

## 2011-02-08 NOTE — Discharge Summary (Signed)
Physician Discharge Summary  Patient ID: JANDA CARGO MRN: [id number] DOB/AGE: [birth year] 78 y.o.  Admit date: 01/21/2011 Discharge date: 02/08/2011  Primary Care Physician:  No primary provider on file.   Discharge Diagnoses:    Present on Admission:  .Weakness generalized .Vertigo .HYPOTHYROIDISM .COPD (chronic obstructive pulmonary disease)  Medication List  As of 02/08/2011  8:10 AM   TAKE these medications         citalopram 10 MG tablet   Commonly known as: CELEXA   Take 10 mg by mouth daily.      diazepam 2 MG tablet   Commonly known as: VALIUM   Take 1-2 mg by mouth at bedtime as needed. For sleep      levothyroxine 100 MCG tablet   Commonly known as: SYNTHROID, LEVOTHROID   Take 100 mcg by mouth daily.      ondansetron 4 MG tablet   Commonly known as: ZOFRAN   Take 4 mg by mouth every 6 (six) hours as needed.      traMADol 50 MG tablet   Commonly known as: ULTRAM   Take 50 mg by mouth every 6 (six) hours as needed. For pain             Disposition and Follow-up: pt medically stable and ready for discharge to home  Consults:  None  Physical exam:  General: Alert, awake, oriented x3, in no acute distress.  HEENT: No bruits, no goiter.  Heart: Regular rate and rhythm, without murmurs, rubs, gallops.  Lungs: Normal effort. Breath sounds clear to auscultation bilaterally.  Abdomen: Soft, nontender, nondistended, positive bowel sounds.  Extremities: No clubbing cyanosis or edema with positive pedal pulses.  Neuro: Grossly intact, nonfocal.   Significant Diagnostic Studies:  Ct Head Wo Contrast  01/21/2011  *RADIOLOGY REPORT*  Clinical Data: Dizziness and weakness.  CT HEAD WITHOUT CONTRAST  Technique:  Contiguous axial images were obtained from the base of the skull through the vertex without contrast.  Comparison: 04/17/2008  Findings: Bone windows demonstrate clear paranasal sinuses and mastoid air cells.  Soft tissue windows demonstrate expected  cerebral atrophy. Cerebellar atrophy as well. No  mass lesion, hemorrhage, hydrocephalus, acute infarct, intra-axial, or extra-axial fluid collection.  IMPRESSION: Normal head CT for age.  Original Report Authenticated By: Consuello Bossier, M.D.   Mr Brain Wo Contrast  01/21/2011  *RADIOLOGY REPORT*  Clinical Data: Dizziness with generalized weakness and unsteady gait.  MRI HEAD WITHOUT CONTRAST  Technique:  Multiplanar, multiecho pulse sequences of the brain and surrounding structures were obtained according to standard protocol without intravenous contrast.  Comparison: CT head earlier in the day.  CT head 04/17/2008.  Findings: There is no evidence for acute infarction, intracranial hemorrhage, mass lesion, hydrocephalus, or extra-axial fluid.  Mild age related atrophy is present.  Mild white matter the disease suggests chronic microvascular ischemic change.  There are no areas of chronic hemorrhage.  The calvarium appears intact. Pituitary, pineal gland, and cerebellar tonsils are all normal.  Paranasal sinuses are clear.  Bilateral cataract extraction has been performed.  Major intracranial vascular structures appear patent. Compared with prior studies, the appearance is stable.  IMPRESSION: Mild age related atrophy and chronic microvascular ischemic change. There is no acute stroke observed.  Original Report Authenticated By: Elsie Stain, M.D.   Dg Chest Portable 1 View  01/21/2011  *RADIOLOGY REPORT*  Clinical Data: Dizziness and shortness of breath.  PORTABLE CHEST - 1 VIEW  Comparison: Chest x-ray 11/06/2010.  Findings: The  cardiac silhouette, mediastinal and hilar contours are within normal limits and stable.  The lungs demonstrate emphysematous changes and areas of scarring but no acute overlying pulmonary process.  No pleural effusion.  The bony thorax is intact.  IMPRESSION: Chronic lung changes but no acute overlying pulmonary findings.  Original Report Authenticated By: P. Loralie Champagne, M.D.      Labs Reviewed  COMPREHENSIVE METABOLIC PANEL - Abnormal; Notable for the following:    GFR calc non Af Amer 79 (*)    All other components within normal limits  URINALYSIS, ROUTINE W REFLEX MICROSCOPIC - Abnormal; Notable for the following:    APPearance CLOUDY (*)    Ketones, ur TRACE (*)    All other components within normal limits  POCT I-STAT, CHEM 8 - Abnormal; Notable for the following:    BUN 5 (*)    All other components within normal limits  GLUCOSE, CAPILLARY - Abnormal; Notable for the following:    Glucose-Capillary 116 (*)    All other components within normal limits  HEMOGLOBIN A1C - Abnormal; Notable for the following:    Hemoglobin A1C 5.8 (*)    Mean Plasma Glucose 120 (*)    All other components within normal limits  BASIC METABOLIC PANEL - Abnormal; Notable for the following:    GFR calc non Af Amer 82 (*)    All other components within normal limits  CBC - Abnormal; Notable for the following:    WBC 3.7 (*)    All other components within normal limits  PROTIME-INR  APTT  CBC  DIFFERENTIAL  TROPONIN I  TSH  CARDIAC PANEL(CRET KIN+CKTOT+MB+TROPI)  CARDIAC PANEL(CRET KIN+CKTOT+MB+TROPI)  URINE CULTURE  CARDIAC PANEL(CRET KIN+CKTOT+MB+TROPI)  LAB REPORT - SCANNED  I-STAT, CHEM 8  POCT CBG MONITORING        Ct Head Wo Contrast  01/21/2011  *RADIOLOGY REPORT*  Clinical Data: Dizziness and weakness.  CT HEAD WITHOUT CONTRAST  Technique:  Contiguous axial images were obtained from the base of the skull through the vertex without contrast.  Comparison: 04/17/2008  Findings: Bone windows demonstrate clear paranasal sinuses and mastoid air cells.  Soft tissue windows demonstrate expected cerebral atrophy. Cerebellar atrophy as well. No  mass lesion, hemorrhage, hydrocephalus, acute infarct, intra-axial, or extra-axial fluid collection.  IMPRESSION: Normal head CT for age.  Original Report Authenticated By: Consuello Bossier, M.D.   Mr Brain Wo  Contrast  01/21/2011  *RADIOLOGY REPORT*  Clinical Data: Dizziness with generalized weakness and unsteady gait.  MRI HEAD WITHOUT CONTRAST  Technique:  Multiplanar, multiecho pulse sequences of the brain and surrounding structures were obtained according to standard protocol without intravenous contrast.  Comparison: CT head earlier in the day.  CT head 04/17/2008.  Findings: There is no evidence for acute infarction, intracranial hemorrhage, mass lesion, hydrocephalus, or extra-axial fluid.  Mild age related atrophy is present.  Mild white matter the disease suggests chronic microvascular ischemic change.  There are no areas of chronic hemorrhage.  The calvarium appears intact. Pituitary, pineal gland, and cerebellar tonsils are all normal.  Paranasal sinuses are clear.  Bilateral cataract extraction has been performed.  Major intracranial vascular structures appear patent. Compared with prior studies, the appearance is stable.  IMPRESSION: Mild age related atrophy and chronic microvascular ischemic change. There is no acute stroke observed.  Original Report Authenticated By: Elsie Stain, M.D.   Dg Chest Portable 1 View  01/21/2011  *RADIOLOGY REPORT*  Clinical Data: Dizziness and shortness of breath.  PORTABLE CHEST -  1 VIEW  Comparison: Chest x-ray 11/06/2010.  Findings: The cardiac silhouette, mediastinal and hilar contours are within normal limits and stable.  The lungs demonstrate emphysematous changes and areas of scarring but no acute overlying pulmonary process.  No pleural effusion.  The bony thorax is intact.  IMPRESSION: Chronic lung changes but no acute overlying pulmonary findings.  Original Report Authenticated By: P. Loralie Champagne, M.D.       Brief H and P: For complete details please refer to admission H and P, but in brief   Patient is a 78 year old female with history of COPD, history of ovarian cancer, history of recent diverticulitis in November 2012, presented to the [facility]  emergency room with chief complaint of weakness since morning. Patient states that she woke up at 6:30 AM this morning and felt extremely lightheaded and dizzy and was unable to sit up. Patient attempted several times to get out of her bed however was unable to do so. Patient's son lives next door whom she called for assistance. Patient otherwise denied any chest pain or any palpitations or shortness of breath any syncopal episode. Patient states that apart from having a bad headache 5 days ago she had been in her baseline health up to this morning. Patient denied any numbness or tingling, focal weakness today. Patient also states that she had similar spells 3 times in the last 6 months.      Hospital Course:  No resolved problems to display.  Active Hospital Problems  Diagnoses Date Noted   . Weakness generalized 01/21/2011   . Vertigo 01/21/2011   . COPD (chronic obstructive pulmonary disease) 01/21/2011   . HYPOTHYROIDISM 03/28/2008     Resolved Hospital Problems  Diagnoses Date Noted Date Resolved    Principal Problem:  Weakness generalized with likely Vertigo versus TIA: Improved. MRI was done in the emergency room which was negative for any acute stroke.  -2-D echo with EF 55-60% . Carotid Dopplers no carotid stenosis. Continue meclizine and aspirin for now.  - Continue PTOT evaluation for vertigo. Will have home PT/OT. Remains dizzy with head movement but improved. Ambulates with assistance x1. Continue ^ meclizine.   Marland KitchenHYPOTHYROIDISM: TSH 0.68. Continue levothyroxin   .COPD (chronic obstructive pulmonary disease): Currently stable, continue when necessary albuterol inhalers.  UTI: Klebsiella UTI continue the Rocephin, switch to oral Cipro .   Time spent on Discharge: 30 minutes  Signed: Gwenyth Bender 02/08/2011, 8:10 AM

## 2011-02-09 NOTE — Discharge Summary (Signed)
Addendum  Patient seen and examined, chart and data base reviewed.  I agree with the above assessment and plan  For full details please see Mrs. Toya Smothers NP Note.  Clint Lipps Pager: [phone] 02/09/2011, 7:41 AM

## 2011-05-12 ENCOUNTER — Other Ambulatory Visit: Payer: Self-pay | Admitting: Family Medicine

## 2011-05-12 DIAGNOSIS — K862 Cyst of pancreas: Secondary | ICD-10-CM

## 2011-05-14 ENCOUNTER — Ambulatory Visit
Admission: RE | Admit: 2011-05-14 | Discharge: 2011-05-14 | Disposition: A | Discharge status: Home / Self Care | Payer: Medicare Other | Source: Ambulatory Visit | Attending: Family Medicine | Admitting: Family Medicine

## 2011-05-14 DIAGNOSIS — K862 Cyst of pancreas: Secondary | ICD-10-CM

## 2011-05-14 MED ORDER — GADOBENATE DIMEGLUMINE 529 MG/ML IV SOLN: 11.0000 mL | Freq: Once | INTRAVENOUS | Status: AC | PRN

## 2011-05-17 ENCOUNTER — Other Ambulatory Visit: Payer: Medicare Other

## 2012-07-13 ENCOUNTER — Other Ambulatory Visit: Payer: Self-pay | Admitting: Family Medicine

## 2012-07-13 DIAGNOSIS — K869 Disease of pancreas, unspecified: Secondary | ICD-10-CM

## 2012-07-19 ENCOUNTER — Ambulatory Visit
Admission: RE | Admit: 2012-07-19 | Discharge: 2012-07-19 | Disposition: A | Discharge status: Home / Self Care | Payer: Medicare Other | Source: Ambulatory Visit | Attending: Family Medicine | Admitting: Family Medicine

## 2012-07-19 DIAGNOSIS — K869 Disease of pancreas, unspecified: Secondary | ICD-10-CM

## 2012-07-19 MED ORDER — GADOBENATE DIMEGLUMINE 529 MG/ML IV SOLN: 11.0000 mL | Freq: Once | INTRAVENOUS | Status: AC | PRN

## 2012-10-24 ENCOUNTER — Encounter (HOSPITAL_COMMUNITY): Payer: Self-pay | Admitting: Emergency Medicine

## 2012-10-24 ENCOUNTER — Observation Stay (HOSPITAL_COMMUNITY): Payer: Medicare Other

## 2012-10-24 ENCOUNTER — Observation Stay (HOSPITAL_COMMUNITY)
Admission: EM | Admit: 2012-10-24 | Discharge: 2012-10-26 | Disposition: A | Discharge status: Home / Self Care | Payer: Medicare Other | Attending: Internal Medicine | Admitting: Internal Medicine

## 2012-10-24 ENCOUNTER — Emergency Department (HOSPITAL_COMMUNITY): Payer: Medicare Other

## 2012-10-24 DIAGNOSIS — R5381 Other malaise: Secondary | ICD-10-CM

## 2012-10-24 DIAGNOSIS — Z8543 Personal history of malignant neoplasm of ovary: Secondary | ICD-10-CM | POA: Insufficient documentation

## 2012-10-24 DIAGNOSIS — R110 Nausea: Secondary | ICD-10-CM | POA: Insufficient documentation

## 2012-10-24 DIAGNOSIS — R42 Dizziness and giddiness: Secondary | ICD-10-CM

## 2012-10-24 DIAGNOSIS — I959 Hypotension, unspecified: Secondary | ICD-10-CM

## 2012-10-24 DIAGNOSIS — M279 Disease of jaws, unspecified: Secondary | ICD-10-CM

## 2012-10-24 DIAGNOSIS — Z888 Allergy status to other drugs, medicaments and biological substances status: Secondary | ICD-10-CM | POA: Insufficient documentation

## 2012-10-24 DIAGNOSIS — G4700 Insomnia, unspecified: Secondary | ICD-10-CM | POA: Insufficient documentation

## 2012-10-24 DIAGNOSIS — J438 Other emphysema: Secondary | ICD-10-CM | POA: Insufficient documentation

## 2012-10-24 DIAGNOSIS — F329 Major depressive disorder, single episode, unspecified: Secondary | ICD-10-CM | POA: Insufficient documentation

## 2012-10-24 DIAGNOSIS — E039 Hypothyroidism, unspecified: Secondary | ICD-10-CM

## 2012-10-24 DIAGNOSIS — J449 Chronic obstructive pulmonary disease, unspecified: Secondary | ICD-10-CM

## 2012-10-24 DIAGNOSIS — F411 Generalized anxiety disorder: Secondary | ICD-10-CM | POA: Insufficient documentation

## 2012-10-24 DIAGNOSIS — F3289 Other specified depressive episodes: Secondary | ICD-10-CM | POA: Insufficient documentation

## 2012-10-24 DIAGNOSIS — Z79899 Other long term (current) drug therapy: Secondary | ICD-10-CM | POA: Insufficient documentation

## 2012-10-24 DIAGNOSIS — I951 Orthostatic hypotension: Secondary | ICD-10-CM | POA: Diagnosis present

## 2012-10-24 DIAGNOSIS — Z880 Allergy status to penicillin: Secondary | ICD-10-CM | POA: Insufficient documentation

## 2012-10-24 DIAGNOSIS — N390 Urinary tract infection, site not specified: Secondary | ICD-10-CM | POA: Insufficient documentation

## 2012-10-24 DIAGNOSIS — R531 Weakness: Secondary | ICD-10-CM

## 2012-10-24 DIAGNOSIS — R55 Syncope and collapse: Principal | ICD-10-CM | POA: Diagnosis present

## 2012-10-24 DIAGNOSIS — M129 Arthropathy, unspecified: Secondary | ICD-10-CM | POA: Insufficient documentation

## 2012-10-24 DIAGNOSIS — R0602 Shortness of breath: Secondary | ICD-10-CM

## 2012-10-24 DIAGNOSIS — R5383 Other fatigue: Secondary | ICD-10-CM | POA: Diagnosis present

## 2012-10-24 HISTORY — DX: Unspecified malignant neoplasm of skin of unspecified part of face: C44.300

## 2012-10-24 HISTORY — DX: Shortness of breath: R06.02

## 2012-10-24 HISTORY — DX: Malignant neoplasm of unspecified ovary: C56.9

## 2012-10-24 HISTORY — DX: Syncope and collapse: R55

## 2012-10-24 HISTORY — DX: Deficiency of other specified B group vitamins: E53.8

## 2012-10-24 HISTORY — DX: Emphysema, unspecified: J43.9

## 2012-10-24 LAB — CREATININE, SERUM
Creatinine, Ser: 0.61 mg/dL (ref 0.50–1.10)
GFR calc Af Amer: >90 mL/min (ref >90)
GFR calc non Af Amer: 82 mL/min — ABNORMAL LOW (ref >90)

## 2012-10-24 LAB — CBC
HCT: 34.5 % — ABNORMAL LOW (ref 36.0–46.0)
Hemoglobin: 12 g/dL (ref 12.0–15.0)
MCH: 30.5 pg (ref 26.0–34.0)
MCHC: 34 g/dL (ref 30.0–36.0)
MCHC: 34.8 g/dL (ref 30.0–36.0)
MCV: 88.7 fL (ref 78.0–100.0)
MCV: 87.8 fL (ref 78.0–100.0)
Platelets: 224 10*3/uL (ref 150–400)
Platelets: 198 K/uL (ref 150–400)
RBC: 3.93 MIL/uL (ref 3.87–5.11)
RDW: 13.1 % (ref 11.5–15.5)
RDW: 12.9 % (ref 11.5–15.5)
WBC: 5.8 10*3/uL (ref 4.0–10.5)
WBC: 4.9 K/uL (ref 4.0–10.5)

## 2012-10-24 LAB — POCT I-STAT TROPONIN I

## 2012-10-24 LAB — BASIC METABOLIC PANEL
Calcium: 9.4 mg/dL (ref 8.4–10.5)
Chloride: 97 mEq/L (ref 96–112)
Creatinine, Ser: 0.67 mg/dL (ref 0.50–1.10)
GFR calc Af Amer: >90 mL/min (ref >90)
GFR calc non Af Amer: 80 mL/min — ABNORMAL LOW (ref >90)
Glucose, Bld: 172 mg/dL — ABNORMAL HIGH (ref 70–99)
Potassium: 3.9 mEq/L (ref 3.5–5.1)

## 2012-10-24 LAB — TSH: TSH: 0.812 u[IU]/mL (ref 0.350–4.500)

## 2012-10-24 LAB — PRO B NATRIURETIC PEPTIDE: Pro B Natriuretic peptide (BNP): 91.3 pg/mL (ref 0–450)

## 2012-10-24 MED ORDER — SODIUM CHLORIDE 0.9 % IJ SOLN
3.0000 mL | Freq: Two times a day (BID) | INTRAMUSCULAR | Status: DC
  Administered 2012-10-25: 3 mL via INTRAVENOUS

## 2012-10-24 MED ORDER — ENOXAPARIN SODIUM 40 MG/0.4ML ~~LOC~~ SOLN
40.0000 mg | SUBCUTANEOUS | Status: DC
  Administered 2012-10-24 – 2012-10-25 (×2): 40 mg via SUBCUTANEOUS
  Filled 2012-10-24 (×3): qty 0.4

## 2012-10-24 MED ORDER — POLYETHYLENE GLYCOL 3350 17 G PO PACK
17.0000 g | PACK | Freq: Every day | ORAL | Status: DC | PRN
  Filled 2012-10-24: qty 1

## 2012-10-24 MED ORDER — ACETAMINOPHEN 650 MG RE SUPP: 650.0000 mg | Freq: Four times a day (QID) | RECTAL | Status: DC | PRN

## 2012-10-24 MED ORDER — ONDANSETRON HCL 4 MG/2ML IJ SOLN: 4.0000 mg | Freq: Four times a day (QID) | INTRAMUSCULAR | Status: DC | PRN

## 2012-10-24 MED ORDER — ACETAMINOPHEN 325 MG PO TABS
650.0000 mg | ORAL_TABLET | Freq: Four times a day (QID) | ORAL | Status: DC | PRN
  Administered 2012-10-24: 650 mg via ORAL
  Filled 2012-10-24: qty 2

## 2012-10-24 MED ORDER — SODIUM CHLORIDE 0.9 % IJ SOLN: 3.0000 mL | INTRAMUSCULAR | Status: DC | PRN

## 2012-10-24 MED ORDER — SODIUM CHLORIDE 0.9 % IJ SOLN: 3.0000 mL | Freq: Two times a day (BID) | INTRAMUSCULAR | Status: DC

## 2012-10-24 MED ORDER — LEVOTHYROXINE SODIUM 100 MCG PO TABS
100.0000 ug | ORAL_TABLET | Freq: Every day | ORAL | Status: DC
  Administered 2012-10-25 – 2012-10-26 (×2): 100 ug via ORAL
  Filled 2012-10-24 (×3): qty 1

## 2012-10-24 MED ORDER — SODIUM CHLORIDE 0.9 % IV SOLN: 250.0000 mL | INTRAVENOUS | Status: DC | PRN

## 2012-10-24 MED ORDER — ALUM & MAG HYDROXIDE-SIMETH 200-200-20 MG/5ML PO SUSP: 30.0000 mL | Freq: Four times a day (QID) | ORAL | Status: DC | PRN

## 2012-10-24 MED ORDER — ONDANSETRON HCL 4 MG PO TABS: 4.0000 mg | ORAL_TABLET | Freq: Four times a day (QID) | ORAL | Status: DC | PRN

## 2012-10-24 NOTE — ED Notes (Signed)
Patient transported to X-ray 

## 2012-10-24 NOTE — ED Notes (Signed)
Reports having syncopal episodes "for years", has seen 2 cardiologists in past "but nobody knows what's causing them". States had a syncopal episode Saturday & today PTA. Family member states had syncopal episode in car which lasted 2 min. Pt c/o nausea, "everything was getting dark & feeling very hot with episode. Denied any CP, palpitations, dizziness, lightheadedness, SOB. Saw a cardiologist approx 1 year ago, placed on holter monitor "but nothing happened". Also reports saw PCP yesterday & was diagnosed with CHF, started on meds. C/o DOE, non prod dry cough for months. No pitting edema to BLE noted.

## 2012-10-24 NOTE — H&P (Signed)
[facility] History and Physical  [name]:[id number] DOB: [birth year] DOA: 10/24/2012  Referring physician:  PCP: [name], MD  Specialists:   Chief Complaint: Syncope  HPI: [name] is a 79 y.o. female with a past medical history of COPD, history of ovarian cancer, who presents to the emergent department after having a syncopal event early in the day. She has a history of recurrent episodes of presyncope, and was last admitted in January of 2013 at which time she presented with dizziness and presyncope. At the time MRI did not show evidence of acute CVA with carotid Dopplers negative for stenosis. Transthoracic echocardiogram showing ejection fraction 55-60%. Symptoms were attributed to vertigo. Since then she continues to have intermittent spells of presyncope. Today she was a passenger driving with a family member, when she reported "everything getting dark" after which he passed out for approximately 1 minute. Prior to this event she denied chest pain, shortness of breath, palpitations, focal neurological deficits, lightheadedness or dizziness. She reports intermittently feeling dizzy when going from laying to standing, although she is currently not on diuretic therapy and feels like she has a good appetite. She denies nausea vomiting diarrhea. Her only recent medication change was the addition of Imdur. In the emergency room she was found to be hypotensive with a blood pressure of 96/51. CT scan of brain pending at the time of this dictation.                        Review of Systems: The patient denies anorexia, fever, weight loss,, vision loss, decreased hearing, hoarseness, chest pain, peripheral edema, balance deficits, hemoptysis, abdominal pain, melena, hematochezia, severe indigestion/heartburn, hematuria, incontinence, genital sores, muscle weakness, suspicious skin lesions, transient blindness, difficulty walking, depression, unusual weight change, abnormal  bleeding, enlarged lymph nodes, angioedema, and breast masses.    Past Medical History  Diagnosis Date  . COPD (chronic obstructive pulmonary disease) dx 2012    emphysema  . Diverticulitis   . UTI (lower urinary tract infection)   . 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 Syncope     when neck is up or turned to side  . Hypothyroidism   . Arthritis   . Bursitis   . Depression   . Anxiety   . Insomnia   . Headache(784.0)     had 01/17/11 and yrs ago   Past Surgical History  Procedure Laterality Date  . Thyroidectomy      years ago  . Abdominal hysterectomy    . Eye surgery      cataracts - bilateral  . Dilation and curettage of uterus      several  . Foot surgery      several right foot surgeries age 47  . Tonsillectomy     Social History:  reports that she has never smoked. She has never used smokeless tobacco. She reports that she does not drink alcohol or use illicit drugs. Patient currently resides in the community, she has a history of secondhand exposure to tobacco, does not drink.   Allergies  Allergen Reactions  . Penicillins Hives and Swelling  . Prednisone Swelling    No family history on file. noncontributory  Prior to Admission medications   Medication Sig Start Date End Date Taking? Authorizing Provider  carvedilol (COREG) 3.125 MG tablet Take 3.125 mg by mouth 2 (two) times daily with a meal.   Yes  Historical Provider, MD  Cholecalciferol (VITAMIN D-3 PO) Take 1 tablet by mouth daily.   Yes Historical Provider, MD  Cyanocobalamin (VITAMIN B-12 IJ) Inject 1 vial as directed every 30 (thirty) days.   Yes Historical Provider, MD  isosorbide mononitrate (IMDUR) 30 MG 24 hr tablet Take 30 mg by mouth daily.   Yes Historical Provider, MD  levothyroxine (SYNTHROID, LEVOTHROID) 100 MCG tablet Take 100 mcg by mouth daily.   Yes Historical Provider, MD  naproxen sodium (ANAPROX) 220 MG tablet Take 220 mg by mouth daily as needed (pain).    Yes Historical Provider, MD   Physical Exam: Filed Vitals:   10/24/12 1349  BP: 100/61  Pulse: 66  Temp: 97.5 F (36.4 C)  Resp: 18     General:  Patient is in no acute distress she is awake alert oriented, well-nourished well-developed  Eyes: Pupils are equal round and reactive to light extraocular movement isn't  Neck: Neck supple symmetrical I don't appreciate jugular venous distention  Cardiovascular: Regular rate and rhythm normal S1-S2  Respiratory: Lungs are clear to auscultation bilaterally no wheezing rhonchi or rales  Abdomen: Abdomen is soft nontender nondistended positive bowel sounds in all 4 quadrants  Skin: No rashes or lesions noted  Musculoskeletal: Preserved range of motion 12 extremities  Psychiatric: Patient is awake alert and oriented x3  Extremities: No cyanosis clubbing or edema  Neurologic: Patient having a nonfocal neurologic examination. Cranial nerves II through XII are grossly intact no alteration to sensation 5 out of 5 muscle strength to all extremities.  Labs on Admission:  Basic Metabolic Panel:  Recent Labs Lab 10/24/12 1005  NA 133*  K 3.9  CL 97  CO2 26  GLUCOSE 172*  BUN 14  CREATININE 0.67  CALCIUM 9.4   Liver Function Tests: No results found for this basename: AST, ALT, ALKPHOS, BILITOT, PROT, ALBUMIN,  in the last 168 hours No results found for this basename: LIPASE, AMYLASE,  in the last 168 hours No results found for this basename: AMMONIA,  in the last 168 hours CBC:  Recent Labs Lab 10/24/12 1005  WBC 5.8  HGB 12.8  HCT 37.6  MCV 88.7  PLT 224   Cardiac Enzymes: No results found for this basename: CKTOTAL, CKMB, CKMBINDEX, TROPONINI,  in the last 168 hours  BNP (last 3 results)  Recent Labs  10/24/12 1006  PROBNP 91.3   CBG: No results found for this basename: GLUCAP,  in the last 168 hours  Radiological Exams on Admission: Dg Chest 2 View  10/24/2012   CLINICAL DATA:  Loss of consciousness,  nausea and vomiting  EXAM: CHEST  2 VIEW  COMPARISON:  Portable chest x-ray of 01/21/2011  FINDINGS: The lungs are hyperaerated consistent with emphysema. Mild biapical pleural parenchymal scarring is stable. Mediastinal contours are stable. The heart is mildly enlarged and stable. No bony abnormality is seen.  IMPRESSION: No active lung disease. Emphysema. Stable cardiomegaly.   Electronically Signed   By: Dwyane Dee M.D.   On: 10/24/2012 11:08    EKG: Independently reviewed. EKG showing sinus rhythm with PVCs. QTC of 486  Assessment/Plan Principal Problem:   Syncope and collapse Active Problems:   HYPOTHYROIDISM   Weakness generalized   COPD (chronic obstructive pulmonary disease)   Hypotension, unspecified   1. Syncope. Patient reporting history of intermittent presyncope. Prior to today's syncopal event, she denied chest pain worsening shortness of breath palpitations dizziness or lightheadedness. Possibilities include hypotension as she was recently started on  Imdur were and presented with systolic blood pressures in the 90s in the emergency room. Other possibilities include orthostatic hypotension in setting of dehydration. Will check a CT scan of brain, order a set of orthostatics, discontinue antihypertensive agents for now, check a urinalysis, admit patient to telemetry and placed on continuous cardiac monitoring. Will obtain a transthoracic echocardiogram and cycled troponin x3 sets.  2. Hypotension. I suspect related to antihypertensive agents, and she is on Coreg and recently started on Imdur. I will discontinue antihypertensive agents for now monitor blood pressures. 3. Hypothyroidism. Continue Synthroid, check a TSH. 4. Chronic obstructive pulmonary disease. Stable.  5. DVT prophylaxis. Lovenox    Code Status: Full code Family Communication: Plan discussed with patient and daughter present at bedside Disposition Plan: Will place patient in observation, I do not anticipate her  requiring greater than 2 night hospitalization  Time spent: 60 minutes  Jeralyn Bennett [facility] Pager [phone]  If 7PM-7AM, please contact night-coverage [email or web address] Password [facility] 10/24/2012, 4:39 PM

## 2012-10-24 NOTE — ED Notes (Signed)
Pt reporting has not been feeling well for about 1 week. Went to PCP yesterday dx with CHF started on new meds last night which she can't remember the name at this time. This morning was in the car with a friend and had a syncopal episode with loss of consciousness. Has been vomiting several times since. Denies any CP or other pain. Reports she feels weak. Pt is a x 4. Speaking clearly and concisely. Pt's daughter with her.

## 2012-10-24 NOTE — ED Provider Notes (Signed)
CSN: [id number]     Arrival date & time 10/24/12  1027 History   First MD Initiated Contact with Patient 10/24/12 1000     Chief Complaint  Patient presents with  . Loss of Consciousness  . Nausea  . Emesis   (Consider location/radiation/quality/duration/timing/severity/associated sxs/prior Treatment) Patient is a 79 y.o. female presenting with syncope.  Loss of Consciousness Episode history:  Single Most recent episode:  Today Duration:  2 minutes Timing:  Constant Progression:  Resolved Chronicity:  Recurrent Context: sitting down   Witnessed: yes   Relieved by:  Nothing Worsened by:  Nothing tried Ineffective treatments:  None tried Associated symptoms: nausea   Associated symptoms: no anxiety, no chest pain, no difficulty breathing, no dizziness, no fever, no headaches, no palpitations, no recent fall, no shortness of breath and no vomiting     Past Medical History  Diagnosis Date  . COPD (chronic obstructive pulmonary disease) dx 2012    emphysema  . Diverticulitis   . UTI (lower urinary tract infection)   . 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 Syncope     when neck is up or turned to side  . Hypothyroidism   . Arthritis   . Bursitis   . Depression   . Anxiety   . Insomnia   . Headache(784.0)     had 01/17/11 and yrs ago   Past Surgical History  Procedure Laterality Date  . Thyroidectomy      years ago  . Abdominal hysterectomy    . Eye surgery      cataracts - bilateral  . Dilation and curettage of uterus      several  . Foot surgery      several right foot surgeries age 83  . Tonsillectomy     No family history on file. History  Substance Use Topics  . Smoking status: Never Smoker   . Smokeless tobacco: Never Used  . Alcohol Use: No   OB History   Grav Para Term Preterm Abortions TAB SAB Ect Mult Living                 Review of Systems  Constitutional: Negative for fever and chills.  HENT: Negative for  congestion, rhinorrhea and sore throat.   Eyes: Negative for photophobia and visual disturbance.  Respiratory: Negative for cough and shortness of breath.   Cardiovascular: Positive for syncope. Negative for chest pain, palpitations and leg swelling.  Gastrointestinal: Positive for nausea. Negative for vomiting, abdominal pain, diarrhea and constipation.  Endocrine: Negative for polyphagia and polyuria.  Genitourinary: Negative for dysuria, flank pain, vaginal bleeding, vaginal discharge and enuresis.  Musculoskeletal: Negative for back pain and gait problem.  Skin: Negative for color change and rash.  Neurological: Negative for dizziness, syncope, light-headedness, numbness and headaches.  Hematological: Negative for adenopathy. Does not bruise/bleed easily.  All other systems reviewed and are negative.    Allergies  Penicillins and Prednisone  Home Medications   Current Outpatient Rx  Name  Route  Sig  Dispense  Refill  . carvedilol (COREG) 3.125 MG tablet   Oral   Take 3.125 mg by mouth 2 (two) times daily with a meal.         . Cholecalciferol (VITAMIN D-3 PO)   Oral   Take 1 tablet by mouth daily.         . Cyanocobalamin (VITAMIN B-12 IJ)   Injection   Inject  1 vial as directed every 30 (thirty) days.         . isosorbide mononitrate (IMDUR) 30 MG 24 hr tablet   Oral   Take 30 mg by mouth daily.         Marland Kitchen levothyroxine (SYNTHROID, LEVOTHROID) 100 MCG tablet   Oral   Take 100 mcg by mouth daily.         . naproxen sodium (ANAPROX) 220 MG tablet   Oral   Take 220 mg by mouth daily as needed (pain).          BP 102/57  Pulse 61  Temp(Src) 97.4 F (36.3 C) (Oral)  Resp 20  Ht 5\' 3"  (1.6 m)  Wt 130 lb (58.968 kg)  BMI 23.03 kg/m2  SpO2 97% Physical Exam  Vitals reviewed. Constitutional: She is oriented to person, place, and time. She appears well-developed and well-nourished.  HENT:  Head: Normocephalic and atraumatic.  Right Ear: External ear  normal.  Left Ear: External ear normal.  Eyes: Conjunctivae and EOM are normal. Pupils are equal, round, and reactive to light.  Neck: Normal range of motion. Neck supple.  Cardiovascular: Normal rate, regular rhythm, normal heart sounds and intact distal pulses.   Pulmonary/Chest: Effort normal and breath sounds normal.  Abdominal: Soft. Bowel sounds are normal. There is no tenderness.  Musculoskeletal: Normal range of motion.  Neurological: She is alert and oriented to person, place, and time.  Skin: Skin is warm and dry.    ED Course  Procedures (including critical care time) Labs Review Labs Reviewed  BASIC METABOLIC PANEL - Abnormal; Notable for the following:    Sodium 133 (*)    Glucose, Bld 172 (*)    GFR calc non Af Amer 80 (*)    All other components within normal limits  CBC  PRO B NATRIURETIC PEPTIDE   Imaging Review Dg Chest 2 View  10/24/2012   CLINICAL DATA:  Loss of consciousness, nausea and vomiting  EXAM: CHEST  2 VIEW  COMPARISON:  Portable chest x-ray of 01/21/2011  FINDINGS: The lungs are hyperaerated consistent with emphysema. Mild biapical pleural parenchymal scarring is stable. Mediastinal contours are stable. The heart is mildly enlarged and stable. No bony abnormality is seen.  IMPRESSION: No active lung disease. Emphysema. Stable cardiomegaly.   Electronically Signed   By: Dwyane Dee M.D.   On: 10/24/2012 11:08    EKG Interpretation   None       Date: 10/24/2012  Rate: 80  Rhythm: normal sinus rhythm  QRS Axis: normal  Intervals: normal  ST/T Wave abnormalities: normal  Conduction Disutrbances: none  Narrative Interpretation: Frequent PVC, NSR  Old EKG Reviewed: more frequent PVC    MDM   1. Syncope    79 y.o. female  with pertinent PMH of COPD presents with syncopal episode as above.  Pt recently diagnosed with CHF yesterday by PCP after having symptoms of progressive dyspnea. Reportedly this diagnosis was made by physical exam, patient  family does not endorse any chest x-ray or lab work support diagnosis. Patient was sitting in car nonexertional had antecedent lightheadedness prior to syncopal episode. Family members witnessed episode and denies any focal deficit. Patient has recurrent history of same symptoms. Arrival to ed vitals as above. Physical exam with no focal neuro deficits unremarkable pulmonary and musculoskeletal exam. EKG as above with frequent PVCs however with no significant ST changes.  Initial troponin completely undetectable. Labs as above unremarkable.  Consulted medicine for admission.  Labs and imaging as above reviewed by myself and attending,Dr. Jeraldine Loots, with whom case was discussed.   1. Syncope         Noel Gerold, MD 10/24/12 1215

## 2012-10-24 NOTE — ED Notes (Signed)
ED MD at bedside. Aware of frequent PVC's & brief episodes of bigeminy

## 2012-10-25 ENCOUNTER — Encounter (HOSPITAL_COMMUNITY): Payer: Self-pay | Admitting: Internal Medicine

## 2012-10-25 DIAGNOSIS — I951 Orthostatic hypotension: Secondary | ICD-10-CM

## 2012-10-25 DIAGNOSIS — I519 Heart disease, unspecified: Secondary | ICD-10-CM

## 2012-10-25 DIAGNOSIS — E039 Hypothyroidism, unspecified: Secondary | ICD-10-CM

## 2012-10-25 DIAGNOSIS — R55 Syncope and collapse: Secondary | ICD-10-CM

## 2012-10-25 LAB — BASIC METABOLIC PANEL
CO2: 25 mEq/L (ref 19–32)
Calcium: 9.5 mg/dL (ref 8.4–10.5)
Creatinine, Ser: 0.63 mg/dL (ref 0.50–1.10)
GFR calc Af Amer: >90 mL/min (ref >90)
Glucose, Bld: 97 mg/dL (ref 70–99)
Potassium: 3.7 mEq/L (ref 3.5–5.1)

## 2012-10-25 LAB — URINALYSIS, ROUTINE W REFLEX MICROSCOPIC
Bilirubin Urine: NEGATIVE
Glucose, UA: NEGATIVE mg/dL
Ketones, ur: NEGATIVE mg/dL
Nitrite: NEGATIVE
Protein, ur: NEGATIVE mg/dL
Specific Gravity, Urine: 1.007 (ref 1.005–1.030)
Urobilinogen, UA: 0.2 mg/dL (ref 0.0–1.0)

## 2012-10-25 LAB — CBC
MCH: 29.9 pg (ref 26.0–34.0)
MCV: 87.2 fL (ref 78.0–100.0)
Platelets: 215 10*3/uL (ref 150–400)
RDW: 13 % (ref 11.5–15.5)

## 2012-10-25 MED ORDER — SODIUM CHLORIDE 0.9 % IV SOLN
INTRAVENOUS | Status: DC
  Administered 2012-10-25: 11:00:00 via INTRAVENOUS

## 2012-10-25 NOTE — Progress Notes (Signed)
Echocardiogram 2D Echocardiogram has been performed.  [name] 10/25/2012, 12:31 PM

## 2012-10-25 NOTE — Evaluation (Signed)
Physical Therapy Evaluation Patient Details Name: [name] MRN: [id number] DOB: [birth year] Today's Date: 10/25/2012 Time: 1478-2956 PT Time Calculation (min): 36 min  PT Assessment / Plan / Recommendation History of Present Illness  79 y.o. female admitted to [facility] on 10/24/12 for syncopal episode.    Clinical Impression  Pt is progressing well with mobility and was asymptomatic with mobility today.  I anticipate that as long as a source of her syncopal episodes can be found that physically/mobility she can return to independent living without therapy f/u at discharge.      PT Assessment  Patient needs continued PT services    Follow Up Recommendations  No PT follow up;Supervision - Intermittent    Does the patient have the potential to tolerate intense rehabilitation     NA  Barriers to Discharge   none      Equipment Recommendations  None recommended by PT    Recommendations for Other Services   None  Frequency Min 3X/week    Precautions / Restrictions Precautions Precautions: Fall Precaution Comments: pt has had several syncopal episodes in the past month   Pertinent Vitals/Pain See vitals flow sheet.       Mobility  Bed Mobility Bed Mobility: Supine to Sit;Sitting - Scoot to Edge of Bed;Sit to Supine Supine to Sit: 6: Modified independent (Device/Increase time);With rails;HOB elevated Sitting - Scoot to Edge of Bed: 6: Modified independent (Device/Increase time);With rail Sit to Supine: 6: Modified independent (Device/Increase time);With rail;HOB elevated Details for Bed Mobility Assistance: HOB elevated.  Pt able to easily get to EOB.   Transfers Transfers: Sit to Stand;Stand to Sit Sit to Stand: 5: Supervision;With upper extremity assist;From bed;From toilet;With armrests Stand to Sit: 5: Supervision;Without upper extremity assist;With armrests;To bed;To toilet Details for Transfer Assistance: pt able to get up and down off of various surfaces with  reliance on hands for support and supervision for safety.  Uses grab bars and armrests when available.   Ambulation/Gait Ambulation/Gait Assistance: 4: Min guard Ambulation Distance (Feet): 200 Feet Assistive device: 1 person hand held assist Ambulation/Gait Assistance Details: min guard assist for safety due to history.  No reports of lightheadednedss.   Gait Pattern: Within Functional Limits Gait velocity: decreased        PT Diagnosis: Difficulty walking;Abnormality of gait  PT Problem List: Decreased activity tolerance;Decreased balance;Decreased mobility PT Treatment Interventions: DME instruction;Gait training;Stair training;Functional mobility training;Therapeutic exercise;Therapeutic activities;Balance training;Neuromuscular re-education;Patient/family education     PT Goals(Current goals can be found in the care plan section) Acute Rehab PT Goals Patient Stated Goal: to figure out why she is having these episodes and get them to stop.   PT Goal Formulation: With patient Time For Goal Achievement: 11/08/12 Potential to Achieve Goals: Good  Visit Information  Last PT Received On: 10/25/12 Assistance Needed: +1 History of Present Illness: 79 y.o. female admitted to [facility] on 10/24/12 for syncopal episode.         Prior Functioning  Home Living Family/patient expects to be discharged to:: Private residence Living Arrangements: Alone Available Help at Discharge: Family;Available PRN/intermittently Type of Home: House Home Access: Stairs to enter [employer] of Steps: 3-4 Entrance Stairs-Rails: Right;Left;Can reach both Home Layout: One level Home Equipment: Walker - 2 wheels;Cane - single point;Bedside commode;Shower seat;Grab bars - tub/shower;Hand held shower head;Wheelchair - manual Prior Function Level of Independence: Independent Comments: has had several episodes of syncope Communication Communication: No difficulties Dominant Hand: Right     Cognition  Cognition Arousal/Alertness: Awake/alert Behavior  During Therapy: WFL for tasks assessed/performed Overall Cognitive Status: Within Functional Limits for tasks assessed    Extremity/Trunk Assessment Upper Extremity Assessment Upper Extremity Assessment: Overall WFL for tasks assessed Lower Extremity Assessment Lower Extremity Assessment: Overall WFL for tasks assessed Cervical / Trunk Assessment Cervical / Trunk Assessment: Normal   Balance Balance Balance Assessed: Yes Static Sitting Balance Static Sitting - Balance Support: Feet supported Static Sitting - Level of Assistance: 7: Independent Static Standing Balance Static Standing - Balance Support: Right upper extremity supported Static Standing - Level of Assistance: 5: Stand by assistance Dynamic Standing Balance Dynamic Standing - Balance Support: Right upper extremity supported Dynamic Standing - Level of Assistance: 5: Stand by assistance  End of Session PT - End of Session Activity Tolerance: Patient limited by fatigue Patient left: in bed;with call bell/phone within reach  GP Functional Assessment Tool Used: assist level Functional Limitation: Mobility: Walking and moving around Mobility: Walking and Moving Around Current Status (Z6109): At least 1 percent but less than 20 percent impaired, limited or restricted Mobility: Walking and Moving Around Goal Status [phone]): 0 percent impaired, limited or restricted   Geroge Gilliam B. Sandrina Heaton, PT, DPT [phone]   10/25/2012, 10:37 AM

## 2012-10-25 NOTE — ED Provider Notes (Signed)
This patient was seen in conjunction with the resident physician, Dr. Littie Deeds. The documentation is accurate and reflects my interpretation, with the following additions: Patient EKG reviewed by me, nonischemic, but with frequency PVC, abnormal This elderly female presents after a syncopal episode.  Patient required admission for further evaluation and management.  Gerhard Munch, MD 10/25/12 [phone]

## 2012-10-25 NOTE — Progress Notes (Signed)
[facility] PROGRESS NOTE  GRACELYN COVENTRY WUJ:[id number] DOB: [birth year] DOA: 10/24/2012 PCP: [name], MD  Assessment/Plan:  Principal Problem:   Syncope and collapse secondary to orthostatic hypotension.  Was just started on coreg and IMDUR yesterday, which has been stopped.  Will give IVF, increase activity, await echo. Home in am if stable Active Problems:   Orthostatic hypotension   HYPOTHYROIDISM  HPI/Subjective: Feels a little better.  Objective: Filed Vitals:   10/25/12 1254  BP: 148/78  Pulse: 85  Temp: 98.6 F (37 C)  Resp: 20    Intake/Output Summary (Last 24 hours) at 10/25/12 1548 Last data filed at 10/25/12 0458  Gross per 24 hour  Intake      0 ml  Output    500 ml  Net   -500 ml   Filed Weights   10/24/12 0952  Weight: 58.968 kg (130 lb)    Exam:   General:  Alert, oriented  Cardiovascular: RRR without WRR  Respiratory: CTA without WRR  Abdomen: S, NT, ND  Musculoskeletal: no CCE  Data Reviewed: Basic Metabolic Panel:  Recent Labs Lab 10/24/12 1005 10/24/12 1711 10/25/12 0505  NA 133*  --  134*  K 3.9  --  3.7  CL 97  --  98  CO2 26  --  25  GLUCOSE 172*  --  97  BUN 14  --  10  CREATININE 0.67 0.61 0.63  CALCIUM 9.4  --  9.5   Liver Function Tests: No results found for this basename: AST, ALT, ALKPHOS, BILITOT, PROT, ALBUMIN,  in the last 168 hours No results found for this basename: LIPASE, AMYLASE,  in the last 168 hours No results found for this basename: AMMONIA,  in the last 168 hours CBC:  Recent Labs Lab 10/24/12 1005 10/24/12 1711 10/25/12 0505  WBC 5.8 4.9 5.4  HGB 12.8 12.0 12.6  HCT 37.6 34.5* 36.8  MCV 88.7 87.8 87.2  PLT 224 198 215   Cardiac Enzymes:  Recent Labs Lab 10/24/12 1711 10/24/12 2245 10/25/12 0505  TROPONINI <0.30 <0.30 <0.30   BNP (last 3 results)  Recent Labs  10/24/12 1006  PROBNP 91.3   CBG: No results found for this basename: GLUCAP,  in the last 168  hours  No results found for this or any previous visit (from the past 240 hour(s)).   Studies: Dg Chest 2 View  10/24/2012   CLINICAL DATA:  Loss of consciousness, nausea and vomiting  EXAM: CHEST  2 VIEW  COMPARISON:  Portable chest x-ray of 01/21/2011  FINDINGS: The lungs are hyperaerated consistent with emphysema. Mild biapical pleural parenchymal scarring is stable. Mediastinal contours are stable. The heart is mildly enlarged and stable. No bony abnormality is seen.  IMPRESSION: No active lung disease. Emphysema. Stable cardiomegaly.   Electronically Signed   By: Dwyane Dee M.D.   On: 10/24/2012 11:08   Ct Head Wo Contrast  10/24/2012   CLINICAL DATA:  Episode of syncope today, now with headache  EXAM: CT HEAD WITHOUT CONTRAST  TECHNIQUE: Contiguous axial images were obtained from the base of the skull through the vertex without intravenous contrast.  COMPARISON:  01/21/2011  FINDINGS: Calvarium is intact. The visualized paranasal sinuses are clear. There is no evidence of vascular territory infarct. There is no extra-axial fluid or parenchymal hemorrhage. There is no abnormal attenuation to suggest mass. There is no hydrocephalus.  IMPRESSION: No acute abnormalities.   Electronically Signed   By: Edgar Frisk.D.  On: 10/24/2012 17:11    Scheduled Meds: . enoxaparin (LOVENOX) injection  40 mg Subcutaneous Q24H  . levothyroxine  100 mcg Oral QAC breakfast  . sodium chloride  3 mL Intravenous Q12H  . sodium chloride  3 mL Intravenous Q12H   Continuous Infusions: . sodium chloride 150 mL/hr at 10/25/12 1047   Time spent: 35 min  Kayton Ripp L  [facility] Pager [phone]. If 7PM-7AM, please contact night-coverage at [email or web address], password [facility] 10/25/2012, 3:48 PM  LOS: 1 day
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
## 2013-04-25 ENCOUNTER — Other Ambulatory Visit: Payer: Self-pay | Admitting: Family Medicine

## 2013-04-25 DIAGNOSIS — R634 Abnormal weight loss: Secondary | ICD-10-CM

## 2013-04-25 DIAGNOSIS — R109 Unspecified abdominal pain: Secondary | ICD-10-CM

## 2013-04-25 DIAGNOSIS — I259 Chronic ischemic heart disease, unspecified: Secondary | ICD-10-CM

## 2013-04-26 ENCOUNTER — Ambulatory Visit
Admission: RE | Admit: 2013-04-26 | Discharge: 2013-04-26 | Disposition: A | Discharge status: Home / Self Care | Payer: Medicare Other | Source: Ambulatory Visit | Attending: Family Medicine | Admitting: Family Medicine

## 2013-04-26 DIAGNOSIS — R634 Abnormal weight loss: Secondary | ICD-10-CM

## 2013-04-26 DIAGNOSIS — R109 Unspecified abdominal pain: Secondary | ICD-10-CM

## 2013-04-26 DIAGNOSIS — I259 Chronic ischemic heart disease, unspecified: Secondary | ICD-10-CM

## 2013-04-26 MED ORDER — IOHEXOL 300 MG/ML  SOLN
100.0000 mL | Freq: Once | INTRAMUSCULAR | Status: AC | PRN
  Administered 2013-04-26: 100 mL via INTRAVENOUS

## 2013-05-04 ENCOUNTER — Encounter (HOSPITAL_COMMUNITY): Payer: Self-pay | Admitting: Emergency Medicine

## 2013-05-04 ENCOUNTER — Other Ambulatory Visit: Payer: Self-pay

## 2013-05-04 ENCOUNTER — Emergency Department (HOSPITAL_COMMUNITY)
Admission: EM | Admit: 2013-05-04 | Discharge: 2013-05-04 | Disposition: A | Discharge status: Home / Self Care | Payer: Medicare Other | Attending: Emergency Medicine | Admitting: Emergency Medicine

## 2013-05-04 DIAGNOSIS — Z8719 Personal history of other diseases of the digestive system: Secondary | ICD-10-CM | POA: Insufficient documentation

## 2013-05-04 DIAGNOSIS — Z8543 Personal history of malignant neoplasm of ovary: Secondary | ICD-10-CM | POA: Insufficient documentation

## 2013-05-04 DIAGNOSIS — E039 Hypothyroidism, unspecified: Secondary | ICD-10-CM | POA: Insufficient documentation

## 2013-05-04 DIAGNOSIS — Z880 Allergy status to penicillin: Secondary | ICD-10-CM | POA: Insufficient documentation

## 2013-05-04 DIAGNOSIS — M19049 Primary osteoarthritis, unspecified hand: Secondary | ICD-10-CM | POA: Insufficient documentation

## 2013-05-04 DIAGNOSIS — Z87440 Personal history of urinary (tract) infections: Secondary | ICD-10-CM | POA: Insufficient documentation

## 2013-05-04 DIAGNOSIS — J438 Other emphysema: Secondary | ICD-10-CM | POA: Insufficient documentation

## 2013-05-04 DIAGNOSIS — F3289 Other specified depressive episodes: Secondary | ICD-10-CM | POA: Insufficient documentation

## 2013-05-04 DIAGNOSIS — Z79899 Other long term (current) drug therapy: Secondary | ICD-10-CM | POA: Insufficient documentation

## 2013-05-04 DIAGNOSIS — F411 Generalized anxiety disorder: Secondary | ICD-10-CM | POA: Insufficient documentation

## 2013-05-04 DIAGNOSIS — R55 Syncope and collapse: Secondary | ICD-10-CM | POA: Insufficient documentation

## 2013-05-04 DIAGNOSIS — E538 Deficiency of other specified B group vitamins: Secondary | ICD-10-CM | POA: Insufficient documentation

## 2013-05-04 DIAGNOSIS — Z85828 Personal history of other malignant neoplasm of skin: Secondary | ICD-10-CM | POA: Insufficient documentation

## 2013-05-04 DIAGNOSIS — F329 Major depressive disorder, single episode, unspecified: Secondary | ICD-10-CM | POA: Insufficient documentation

## 2013-05-04 LAB — CBC WITH DIFFERENTIAL/PLATELET
BASOS PCT: 0 % (ref 0–1)
Basophils Absolute: 0 10*3/uL (ref 0.0–0.1)
EOS PCT: 0 % (ref 0–5)
Eosinophils Absolute: 0 10*3/uL (ref 0.0–0.7)
HCT: 38.8 % (ref 36.0–46.0)
Hemoglobin: 12.9 g/dL (ref 12.0–15.0)
Lymphocytes Relative: 17 % (ref 12–46)
Lymphs Abs: 0.8 10*3/uL (ref 0.7–4.0)
MCH: 29.7 pg (ref 26.0–34.0)
MCHC: 33.2 g/dL (ref 30.0–36.0)
MCV: 89.4 fL (ref 78.0–100.0)
Monocytes Absolute: 0.5 10*3/uL (ref 0.1–1.0)
Monocytes Relative: 10 % (ref 3–12)
NEUTROS PCT: 73 % (ref 43–77)
Neutro Abs: 3.6 10*3/uL (ref 1.7–7.7)
PLATELETS: 255 10*3/uL (ref 150–400)
RBC: 4.34 MIL/uL (ref 3.87–5.11)
RDW: 12.7 % (ref 11.5–15.5)
WBC: 4.9 10*3/uL (ref 4.0–10.5)

## 2013-05-04 LAB — BASIC METABOLIC PANEL
BUN: 8 mg/dL (ref 6–23)
CO2: 20 mEq/L (ref 19–32)
Calcium: 8.8 mg/dL (ref 8.4–10.5)
Chloride: 98 mEq/L (ref 96–112)
Creatinine, Ser: 0.65 mg/dL (ref 0.50–1.10)
GFR calc Af Amer: >90 mL/min (ref >90)
GFR calc non Af Amer: 80 mL/min — ABNORMAL LOW (ref >90)
Glucose, Bld: 117 mg/dL — ABNORMAL HIGH (ref 70–99)
Potassium: 3.7 mEq/L (ref 3.7–5.3)
Sodium: 135 mEq/L — ABNORMAL LOW (ref 137–147)

## 2013-05-04 LAB — URINALYSIS, ROUTINE W REFLEX MICROSCOPIC
Bilirubin Urine: NEGATIVE
Glucose, UA: NEGATIVE mg/dL
Ketones, ur: 15 mg/dL — AB
Nitrite: NEGATIVE
Protein, ur: NEGATIVE mg/dL
Specific Gravity, Urine: 1.012 (ref 1.005–1.030)
Urobilinogen, UA: 0.2 mg/dL (ref 0.0–1.0)
pH: 5.5 (ref 5.0–8.0)

## 2013-05-04 LAB — URINE MICROSCOPIC-ADD ON

## 2013-05-04 LAB — I-STAT TROPONIN, ED: TROPONIN I, POC: 0 ng/mL (ref 0.00–0.08)

## 2013-05-04 MED ORDER — SODIUM CHLORIDE 0.9 % IV BOLUS (SEPSIS)
1000.0000 mL | Freq: Once | INTRAVENOUS | Status: AC
  Administered 2013-05-04: 1000 mL via INTRAVENOUS

## 2013-05-04 NOTE — Discharge Instructions (Signed)
Near-Syncope °Near-syncope (commonly known as near fainting) is sudden weakness, dizziness, or feeling like you might pass out. During an episode of near-syncope, you may also develop pale skin, have tunnel vision, or feel sick to your stomach (nauseous). Near-syncope may occur when getting up after sitting or while standing for a long time. It is caused by a sudden decrease in blood flow to the brain. This decrease can result from various causes or triggers, most of which are not serious. However, because near-syncope can sometimes be a sign of something serious, a medical evaluation is required. The specific cause is often not determined. °HOME CARE INSTRUCTIONS  °Monitor your condition for any changes. The following actions may help to alleviate any discomfort you are experiencing: °· Have someone stay with you until you feel stable. °· Lie down right away if you start feeling like you might faint. Breathe deeply and steadily. Wait until all the symptoms have passed. Most of these episodes last only a few minutes. You may feel tired for several hours.   °· Drink enough fluids to keep your urine clear or pale yellow.   °· If you are taking blood pressure or heart medicine, get up slowly when seated or lying down. Take several minutes to sit and then stand. This can reduce dizziness. °· Follow up with your health care provider as directed.  °SEEK IMMEDIATE MEDICAL CARE IF:  °· You have a severe headache.   °· You have unusual pain in the chest, abdomen, or back.   °· You are bleeding from the mouth or rectum, or you have black or tarry stool.   °· You have an irregular or very fast heartbeat.   °· You have repeated fainting or have seizure-like jerking during an episode.   °· You faint when sitting or lying down.   °· You have confusion.   °· You have difficulty walking.   °· You have severe weakness.   °· You have vision problems.   °MAKE SURE YOU:  °· Understand these instructions. °· Will watch your  condition. °· Will get help right away if you are not doing well or get worse. °Document Released: 12/28/2004 Document Revised: 08/30/2012 Document Reviewed: 06/02/2012 °Exit[employer]® Patient Information ©2014 [employer]. ° °

## 2013-05-04 NOTE — ED Provider Notes (Signed)
CSN: [id number]     Arrival date & time 05/04/13  1244 History   First MD Initiated Contact with Patient 05/04/13 1257     Chief Complaint  Patient presents with  . Near Syncope     (Consider location/radiation/quality/duration/timing/severity/associated sxs/prior Treatment) HPI  80 year old female with near syncope. C. before arrival. 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 She was standing in line to get some food when she began to feel lightheaded, flushed and her vision got dark. She sat down and symptoms slowly improved over the course of couple minutes. She did not lose consciousness. She currently has no complaints. She denies having any pain. Was in her usual state of health prior to this event. She has had these symptoms intermittently over the past few years. Has been admitted twice for similar, most recently 10/14. W/u have previously been pretty unremarkable.   ECHO from admit 10/14:  Impressions - Normal LV size and systolic function, EF 28%. Normal RV size and systolic function. No significant valvular abnormalities.   Past Medical History  Diagnosis Date  . Diverticulitis   . UTI (lower urinary tract infection)   . Syncope     when neck is up or turned to side  . Hypothyroidism   . Bursitis   . Depression   . Anxiety   . Insomnia   . Emphysema dx 2012  . Exertional shortness of breath   . Headache(784.0)     "weekly at least" (10/24/2012)  . Arthritis     "hands" (10/24/2012)  . Ovarian cancer     mass hooked to sm. intestine/bladder  . Skin cancer of face   . B12 deficiency     "get shots twice/month" (10/24/2012)  . Syncope and collapse     "first time I ever I collapsed"; confirms h/o presyncopal events (10/24/2012)   Past Surgical History  Procedure Laterality Date  . Thyroidectomy    . Abdominal hysterectomy    . Cataract extraction w/ intraocular lens  implant, bilateral Bilateral 2000's  . Dilation and curettage of uterus       several  . Foot surgery Right 1943-1944    4 surgeries  . Tonsillectomy     No family history on file. History  Substance Use Topics  . Smoking status: Never Smoker   . Smokeless tobacco: Never Used  . Alcohol Use: No   OB History   Grav Para Term Preterm Abortions TAB SAB Ect Mult Living                 Review of Systems  All systems reviewed and negative, other than as noted in HPI.   Allergies  Penicillins and Prednisone  Home Medications   Prior to Admission medications   Medication Sig Start Date End Date Taking? Authorizing Provider  Cholecalciferol (VITAMIN D-3 PO) Take 1 tablet by mouth daily.   Yes Historical Provider, MD  Cyanocobalamin (VITAMIN B-12 IJ) Inject 1 vial as directed every 30 (thirty) days.   Yes Historical Provider, MD  levothyroxine (SYNTHROID, LEVOTHROID) 100 MCG tablet Take 100 mcg by mouth daily.   Yes Historical Provider, MD  naproxen sodium (ANAPROX) 220 MG tablet Take 220 mg by mouth daily as needed (pain).   Yes Historical Provider, MD  traZODone (DESYREL) 50 MG tablet Take 50 mg by mouth at bedtime as needed for sleep.   Yes Historical Provider, MD   BP 151/64  Pulse 67  Temp(Src) 97.9 F (36.6 C) (  Oral)  Resp 17  Ht 5\' 4"  (1.626 m)  Wt 125 lb (56.7 kg)  BMI 21.45 kg/m2  SpO2 99% Physical Exam  Nursing note and vitals reviewed. Constitutional: She is oriented to person, place, and time. She appears well-developed and well-nourished. No distress.  HENT:  Head: Normocephalic and atraumatic.  Eyes: Conjunctivae are normal. Right eye exhibits no discharge. Left eye exhibits no discharge.  Neck: Neck supple.  Cardiovascular: Normal rate, regular rhythm and normal heart sounds.  Exam reveals no gallop and no friction rub.   No murmur heard. Pulmonary/Chest: Effort normal and breath sounds normal. No respiratory distress.  Abdominal: Soft. She exhibits no distension. There is no tenderness.  Musculoskeletal: She exhibits no edema  and no tenderness.  Lower extremities symmetric as compared to each other. No calf tenderness. Negative Homan's. No palpable cords.   Neurological: She is alert and oriented to person, place, and time. No cranial nerve deficit. She exhibits normal muscle tone. Coordination normal.  Speech clear. Content appropriate. Good finger to nose b/l.   Skin: Skin is warm and dry. She is not diaphoretic.  Psychiatric: She has a normal mood and affect. Her behavior is normal. Thought content normal.    ED Course  Procedures (including critical care time) Labs Review Labs Reviewed  CBC WITH DIFFERENTIAL  URINALYSIS, ROUTINE W REFLEX MICROSCOPIC  BASIC METABOLIC PANEL  POCT CBG (FASTING - GLUCOSE)-MANUAL ENTRY  I-STAT TROPOININ, ED    Imaging Review No results found.   EKG Interpretation None     EKG:  Rhythm: normal sinus with PVCs Rate: 71 PR: 199 ms QRS: 89 ms QTc: 490 ST segments: NS ST changes No significant change from prior    MDM   Final diagnoses:  Pre-syncope    80 year old female with near syncope. By the time she arrived to the ER she was asymptomatic. Her exam is unremarkable. CBC is normal. EKG shows PVCs, otherwise pretty unremarkable and unchanged from priors. Se is afebrile. No hypotension. Daughter requesting urine be check. She has no specific urinary complaints, but I think checking a UA is reasonable. Issues with blood work and BMP had to be resent. Discussed case with Dr Regenia Skeeter in sign out. Unless something on her BMP needs acutely addressed, I feel she can safely be discharged.      Virgel Manifold, MD 05/04/13 [phone]

## 2013-05-04 NOTE — ED Provider Notes (Signed)
Labs, including BMP and urine, are benign. Patient has not had recurrence of symptoms. Feels normal, is well appearing. Will discharge with return precautions.   Ephraim Hamburger, MD 05/04/13 [phone]

## 2013-05-04 NOTE — ED Notes (Addendum)
Pt arrives [facility] from [place] where patient was helping a field day. Pt states she was walking, began feeling weak, became hot and seeing black. Pt sat down on a bench and received care. Denies pain.    Zofran 4 mg given PTA

## 2013-12-28 ENCOUNTER — Ambulatory Visit (INDEPENDENT_AMBULATORY_CARE_PROVIDER_SITE_OTHER): Payer: Medicare Other | Admitting: *Deleted

## 2013-12-28 DIAGNOSIS — R0600 Dyspnea, unspecified: Secondary | ICD-10-CM

## 2013-12-28 DIAGNOSIS — R55 Syncope and collapse: Secondary | ICD-10-CM

## 2013-12-28 DIAGNOSIS — R0609 Other forms of dyspnea: Secondary | ICD-10-CM

## 2013-12-28 NOTE — Progress Notes (Signed)
Patient arrived with her daughter. Daughter asked if patient would be seeing a doctor. RN informed her that this visit is only to place 48 monitor on patient. Monitor order by Dr Tamsen Roers Instruction given and patient and daughter verbalized understanding.

## 2014-01-02 ENCOUNTER — Telehealth: Payer: Self-pay | Admitting: *Deleted

## 2014-01-02 NOTE — Telephone Encounter (Signed)
Preliminary 48 hour holter report-faxed to  Dr Jeneen Rinks LITTLE 'S office. Dr Rex Kras order monitor. Cardiologist has not reviewed.

## 2014-01-09 ENCOUNTER — Other Ambulatory Visit: Payer: Self-pay | Admitting: Family Medicine

## 2014-01-09 ENCOUNTER — Telehealth: Payer: Self-pay | Admitting: *Deleted

## 2014-01-09 DIAGNOSIS — R10819 Abdominal tenderness, unspecified site: Secondary | ICD-10-CM

## 2014-01-09 NOTE — Telephone Encounter (Signed)
[name] (daughter) called in stating that the pt had to wear a heart monitor and she would like to know what the results from the monitor was. Please call  Thanks

## 2014-01-09 NOTE — Telephone Encounter (Signed)
Spoke to daughter.  RN informed patient's daughter - monitor report faxed to Dr little on 01/02/14 Informed daughter Dr Rex Kras will need to give her the results since he order the montior. She verbalized understanding.

## 2014-01-14 ENCOUNTER — Ambulatory Visit
Admission: RE | Admit: 2014-01-14 | Discharge: 2014-01-14 | Disposition: A | Discharge status: Home / Self Care | Payer: Medicare Other | Source: Ambulatory Visit | Attending: Family Medicine | Admitting: Family Medicine

## 2014-01-14 DIAGNOSIS — R10819 Abdominal tenderness, unspecified site: Secondary | ICD-10-CM

## 2014-01-14 MED ORDER — IOHEXOL 300 MG/ML  SOLN
100.0000 mL | Freq: Once | INTRAMUSCULAR | Status: AC | PRN
  Administered 2014-01-14: 100 mL via INTRAVENOUS

## 2014-03-27 ENCOUNTER — Ambulatory Visit: Payer: Self-pay | Admitting: Neurology

## 2014-07-03 ENCOUNTER — Telehealth: Payer: Self-pay | Admitting: Cardiology

## 2014-07-03 NOTE — Telephone Encounter (Signed)
Received records from [facility] for appointment on 09/04/14 with Dr Stanford Breed.  Records given to [facility] (medical records) for Dr Jacalyn Lefevre schedule on 09/04/14. lp

## 2014-08-27 ENCOUNTER — Other Ambulatory Visit: Payer: Self-pay | Admitting: Family Medicine

## 2014-08-27 DIAGNOSIS — R634 Abnormal weight loss: Secondary | ICD-10-CM

## 2014-08-27 DIAGNOSIS — K861 Other chronic pancreatitis: Secondary | ICD-10-CM

## 2014-08-29 ENCOUNTER — Ambulatory Visit
Admission: RE | Admit: 2014-08-29 | Discharge: 2014-08-29 | Disposition: A | Discharge status: Home / Self Care | Payer: Medicare Other | Source: Ambulatory Visit | Attending: Family Medicine | Admitting: Family Medicine

## 2014-08-29 ENCOUNTER — Other Ambulatory Visit: Payer: Self-pay | Admitting: Family Medicine

## 2014-08-29 DIAGNOSIS — R634 Abnormal weight loss: Secondary | ICD-10-CM

## 2014-08-29 DIAGNOSIS — K861 Other chronic pancreatitis: Secondary | ICD-10-CM

## 2014-08-29 MED ORDER — IOPAMIDOL (ISOVUE-300) INJECTION 61%
100.0000 mL | Freq: Once | INTRAVENOUS | Status: AC | PRN
  Administered 2014-08-29: 100 mL via INTRAVENOUS

## 2014-08-30 NOTE — Progress Notes (Signed)
HPI: 81 year old female for evaluation of chest pain. Nuclear study in 2010 showed an ejection fraction of 66% and normal perfusion. Carotid Dopplers January 2013 showed no significant stenosis bilaterally. Echocardiogram October 2014 showed normal LV function, grade 1 diastolic dysfunction, trace aortic and mitral regurgitation. Holter monitor December 2015 showed no significant arrhythmia.  Current Outpatient Prescriptions  Medication Sig Dispense Refill  . Cholecalciferol (VITAMIN D-3 PO) Take 1 tablet by mouth daily.    . Cyanocobalamin (VITAMIN B-12 IJ) Inject 1 vial as directed every 30 (thirty) days.    Marland Kitchen levothyroxine (SYNTHROID, LEVOTHROID) 100 MCG tablet Take 100 mcg by mouth daily.    . naproxen sodium (ANAPROX) 220 MG tablet Take 220 mg by mouth daily as needed (pain).    . traZODone (DESYREL) 50 MG tablet Take 50 mg by mouth at bedtime as needed for sleep.     No current facility-administered medications for this visit.    Allergies  Allergen Reactions  . Penicillins Hives and Swelling  . Prednisone Swelling    Past Medical History  Diagnosis Date  . Diverticulitis   . UTI (lower urinary tract infection)   . Syncope     when neck is up or turned to side  . Hypothyroidism   . Bursitis   . Depression   . Anxiety   . Insomnia   . Emphysema dx 2012  . Exertional shortness of breath   . Headache(784.0)     "weekly at least" (10/24/2012)  . Arthritis     "hands" (10/24/2012)  . Ovarian cancer     mass hooked to sm. intestine/bladder  . Skin cancer of face   . B12 deficiency     "get shots twice/month" (10/24/2012)  . Syncope and collapse     "first time I ever I collapsed"; confirms h/o presyncopal events (10/24/2012)    Past Surgical History  Procedure Laterality Date  . Thyroidectomy    . Abdominal hysterectomy    . Cataract extraction w/ intraocular lens  implant, bilateral Bilateral 2000's  . Dilation and curettage of uterus      several  . Foot  surgery Right 1943-1944    4 surgeries  . Tonsillectomy      Social History   Social History  . Marital Status: Widowed    Spouse Name: N/A  . Number of Children: N/A  . Years of Education: N/A   Occupational History  . Not on file.   Social History Main Topics  . Smoking status: Never Smoker   . Smokeless tobacco: Never Used  . Alcohol Use: No  . Drug Use: No  . Sexual Activity: No   Other Topics Concern  . Not on file   Social History Narrative    No family history on file.  ROS: no fevers or chills, productive cough, hemoptysis, dysphasia, odynophagia, melena, hematochezia, dysuria, hematuria, rash, seizure activity, orthopnea, PND, pedal edema, claudication. Remaining systems are negative.  Physical Exam:   There were no vitals taken for this visit.  General:  Well developed/well nourished in NAD Skin warm/dry Patient not depressed No peripheral clubbing Back-normal HEENT-normal/normal eyelids Neck supple/normal carotid upstroke bilaterally; no bruits; no JVD; no thyromegaly chest - CTA/ normal expansion CV - RRR/normal S1 and S2; no murmurs, rubs or gallops;  PMI nondisplaced Abdomen -NT/ND, no HSM, no mass, + bowel sounds, no bruit 2+ femoral pulses, no bruits Ext-no edema, chords, 2+ DP Neuro-grossly nonfocal  ECG    This encounter was created  in error - please disregard.

## 2014-09-04 ENCOUNTER — Encounter: Payer: Medicare Other | Admitting: Cardiology

## 2014-12-25 ENCOUNTER — Encounter: Payer: Self-pay | Admitting: Cardiology

## 2015-03-28 ENCOUNTER — Encounter: Payer: Self-pay | Admitting: Emergency Medicine

## 2015-03-28 ENCOUNTER — Emergency Department
Admission: EM | Admit: 2015-03-28 | Discharge: 2015-03-28 | Disposition: A | Discharge status: Home / Self Care | Payer: Medicare Other | Attending: Emergency Medicine | Admitting: Emergency Medicine

## 2015-03-28 ENCOUNTER — Emergency Department: Payer: Medicare Other

## 2015-03-28 DIAGNOSIS — Z79899 Other long term (current) drug therapy: Secondary | ICD-10-CM | POA: Insufficient documentation

## 2015-03-28 DIAGNOSIS — E039 Hypothyroidism, unspecified: Secondary | ICD-10-CM | POA: Diagnosis not present

## 2015-03-28 DIAGNOSIS — M719 Bursopathy, unspecified: Secondary | ICD-10-CM | POA: Diagnosis not present

## 2015-03-28 DIAGNOSIS — Z961 Presence of intraocular lens: Secondary | ICD-10-CM | POA: Insufficient documentation

## 2015-03-28 DIAGNOSIS — C44310 Basal cell carcinoma of skin of unspecified parts of face: Secondary | ICD-10-CM | POA: Diagnosis not present

## 2015-03-28 DIAGNOSIS — Z90710 Acquired absence of both cervix and uterus: Secondary | ICD-10-CM | POA: Diagnosis not present

## 2015-03-28 DIAGNOSIS — M1990 Unspecified osteoarthritis, unspecified site: Secondary | ICD-10-CM | POA: Insufficient documentation

## 2015-03-28 DIAGNOSIS — R55 Syncope and collapse: Secondary | ICD-10-CM | POA: Diagnosis not present

## 2015-03-28 DIAGNOSIS — J449 Chronic obstructive pulmonary disease, unspecified: Secondary | ICD-10-CM | POA: Insufficient documentation

## 2015-03-28 DIAGNOSIS — K5792 Diverticulitis of intestine, part unspecified, without perforation or abscess without bleeding: Secondary | ICD-10-CM | POA: Insufficient documentation

## 2015-03-28 DIAGNOSIS — F329 Major depressive disorder, single episode, unspecified: Secondary | ICD-10-CM | POA: Diagnosis not present

## 2015-03-28 LAB — BASIC METABOLIC PANEL
ANION GAP: 5 (ref 5–15)
BUN: 10 mg/dL (ref 6–20)
CALCIUM: 9.1 mg/dL (ref 8.9–10.3)
CO2: 26 mmol/L (ref 22–32)
Chloride: 102 mmol/L (ref 101–111)
Creatinine, Ser: 0.62 mg/dL (ref 0.44–1.00)
Glucose, Bld: 83 mg/dL (ref 65–99)
Potassium: 3.6 mmol/L (ref 3.5–5.1)
SODIUM: 133 mmol/L — AB (ref 135–145)

## 2015-03-28 LAB — TSH: TSH: 0.541 u[IU]/mL (ref 0.350–4.500)

## 2015-03-28 LAB — URINALYSIS COMPLETE WITH MICROSCOPIC (ARMC ONLY)
Bacteria, UA: NONE SEEN
Bilirubin Urine: NEGATIVE
GLUCOSE, UA: NEGATIVE mg/dL
LEUKOCYTES UA: NEGATIVE
NITRITE: NEGATIVE
Protein, ur: NEGATIVE mg/dL
SPECIFIC GRAVITY, URINE: 1.005 (ref 1.005–1.030)
pH: 7 (ref 5.0–8.0)

## 2015-03-28 LAB — CBC
HCT: 37.7 % (ref 35.0–47.0)
HEMOGLOBIN: 12.6 g/dL (ref 12.0–16.0)
MCH: 30.1 pg (ref 26.0–34.0)
MCHC: 33.6 g/dL (ref 32.0–36.0)
MCV: 89.7 fL (ref 80.0–100.0)
Platelets: 201 10*3/uL (ref 150–440)
RBC: 4.2 MIL/uL (ref 3.80–5.20)
RDW: 13.3 % (ref 11.5–14.5)
WBC: 4.7 10*3/uL (ref 3.6–11.0)

## 2015-03-28 LAB — TROPONIN I

## 2015-03-28 MED ORDER — SODIUM CHLORIDE 0.9 % IV BOLUS (SEPSIS)
500.0000 mL | Freq: Once | INTRAVENOUS | Status: AC
  Administered 2015-03-28: 500 mL via INTRAVENOUS

## 2015-03-28 NOTE — ED Provider Notes (Signed)
Time Seen: Approximately 1515  I have reviewed the triage notes  Chief Complaint: Fall   History of Present Illness: [name] is a 82 y.o. female who states she fell with a syncopal episode approximately 2 weeks ago. Patient has a long history of intermittent vertiginous symptoms along with near syncopal episodes. The patient states that this particular fall seemed to be more lightheaded syncope. She states normally she can "" catch herself "" and sits down with symptomatic relief. Patient states that this particular episode she did not and she seems to be generalized weakness since her fall. She states a frontal headache without any persistent nausea or vomiting. She denies any loose stool or diarrhea. She denies any melena or hematochezia. She denies any trouble with speech or swallowing. She does state that her visual acuity seems to be "" also "" and describes it as leave the vision and possibly blurred vision. She denies any visual field deficits. She denies any ear pain or deafness. No unusual rashes or lesions. She denies any eye pain.   Past Medical History  Diagnosis Date  . Diverticulitis   . UTI (lower urinary tract infection)   . Syncope     when neck is up or turned to side  . Hypothyroidism   . Bursitis   . Depression   . Anxiety   . Insomnia   . Emphysema dx 2012  . Exertional shortness of breath   . Headache(784.0)     "weekly at least" (10/24/2012)  . Arthritis     "hands" (10/24/2012)  . Ovarian cancer ([place])     mass hooked to sm. intestine/bladder  . Skin cancer of face   . B12 deficiency     "get shots twice/month" (10/24/2012)  . Syncope and collapse     "first time I ever I collapsed"; confirms h/o presyncopal events (10/24/2012)    Patient Active Problem List   Diagnosis Date Noted  . Syncope and collapse 10/24/2012  . Orthostatic hypotension 10/24/2012  . Weakness generalized 01/21/2011  . Vertigo 01/21/2011  . COPD (chronic obstructive  pulmonary disease) ([place]) 01/21/2011  . HYPOTHYROIDISM 03/28/2008  . JAW PAIN 03/28/2008  . DYSPNEA 03/28/2008    Past Surgical History  Procedure Laterality Date  . Thyroidectomy    . Abdominal hysterectomy    . Cataract extraction w/ intraocular lens  implant, bilateral Bilateral 2000's  . Dilation and curettage of uterus      several  . Foot surgery Right 1943-1944    4 surgeries  . Tonsillectomy      Past Surgical History  Procedure Laterality Date  . Thyroidectomy    . Abdominal hysterectomy    . Cataract extraction w/ intraocular lens  implant, bilateral Bilateral 2000's  . Dilation and curettage of uterus      several  . Foot surgery Right 1943-1944    4 surgeries  . Tonsillectomy      Current Outpatient Rx  Name  Route  Sig  Dispense  Refill  . ALPRAZolam (XANAX) 0.25 MG tablet   Oral   Take 0.25 mg by mouth 2 (two) times daily.       1   . Cholecalciferol (VITAMIN D-3 PO)   Oral   Take 1 tablet by mouth daily.         . Cyanocobalamin (VITAMIN B-12 IJ)   Injection   Inject 1 vial as directed every 21 ( twenty-one) days.          Marland Kitchen  escitalopram (LEXAPRO) 20 MG tablet   Oral   Take 1 tablet by mouth daily.      5   . levothyroxine (SYNTHROID, LEVOTHROID) 100 MCG tablet   Oral   Take 100 mcg by mouth daily.         . naproxen sodium (ANAPROX) 220 MG tablet   Oral   Take 220 mg by mouth daily as needed (pain).         . temazepam (RESTORIL) 15 MG capsule   Oral   Take 1 capsule by mouth at bedtime as needed for sleep.       2   . traZODone (DESYREL) 50 MG tablet   Oral   Take 50 mg by mouth at bedtime as needed for sleep.           Allergies:  Penicillins and Prednisone  Family History: History reviewed. No pertinent family history.  Social History: Social History  Substance Use Topics  . Smoking status: Never Smoker   . Smokeless tobacco: Never Used  . Alcohol Use: No     Review of Systems:   10 point review of  systems was performed and was otherwise negative:  Constitutional: No fever Eyes: No visual disturbances ENT: No sore throat, ear pain Cardiac: No chest pain Respiratory: No shortness of breath, wheezing, or stridor Abdomen: No abdominal pain, no vomiting, No diarrhea Endocrine: No weight loss, No night sweats Extremities: No peripheral edema, cyanosis Skin: No rashes, easy bruising Neurologic: No focal weakness, trouble with speech or swollowing Urologic: No dysuria, Hematuria, or urinary frequency   Physical Exam:  ED Triage Vitals  Enc Vitals Group     BP 03/28/15 1153 146/68 mmHg     Pulse Rate 03/28/15 1153 63     Resp 03/28/15 1153 16     Temp 03/28/15 1153 98.3 F (36.8 C)     Temp Source 03/28/15 1153 Oral     SpO2 03/28/15 1153 97 %     Weight 03/28/15 1153 125 lb (56.7 kg)     Height 03/28/15 1153 5\' 2"  (1.575 m)     Head Cir --      Peak Flow --      Pain Score 03/28/15 1150 3     Pain Loc --      Pain Edu? --      Excl. in [place]? --     General: Awake , Alert , and Oriented times 3; GCS 15 Head: Normal cephalic , atraumatic Eyes: Pupils equal , round, reactive to light Nose/Throat: No nasal drainage, patent upper airway without erythema or exudate.  Neck: Supple, Full range of motion, No anterior adenopathy or palpable thyroid masses Lungs: Clear to ascultation without wheezes , rhonchi, or rales Heart: Regular rate, regular rhythm without murmurs , gallops , or rubs Abdomen: Soft, non tender without rebound, guarding , or rigidity; bowel sounds positive and symmetric in all 4 quadrants. No organomegaly .        Extremities: 2 plus symmetric pulses. No edema, clubbing or cyanosis Neurologic: normal ambulation, Motor symmetric without deficits, sensory intact Skin: warm, dry, no rashes   Labs:   All laboratory work was reviewed including any pertinent negatives or positives listed below:  Labs Reviewed  BASIC METABOLIC PANEL - Abnormal; Notable for the  following:    Sodium 133 (*)    All other components within normal limits  CBC  TROPONIN I  URINALYSIS COMPLETEWITH MICROSCOPIC ([facility] ONLY)  TSH  EKG:  ED ECG REPORT I, Daymon Larsen, the attending physician, personally viewed and interpreted this ECG.  Date: 03/28/2015 EKG Time: 1201 Rate: 60 Rhythm: normal sinus rhythm QRS Axis: normal Intervals: normal ST/T Wave abnormalities: normal Conduction Disturbances: none Narrative Interpretation: unremarkable No acute ischemic changes   Radiology: *  Narrative:   CLINICAL DATA: Syncope and head injury 2 weeks ago  EXAM: CT HEAD WITHOUT CONTRAST  TECHNIQUE: Contiguous axial images were obtained from the base of the skull through the vertex without intravenous contrast.  COMPARISON: None.  FINDINGS: Mild chronic ischemic changes in the periventricular white matter. No mass effect, midline shift, or acute hemorrhage. Mastoid air cells are clear. The cranium is intact.  IMPRESSION: No acute intracranial pathology   Electronically Signed By: Marybelle Killings M.D.         I personally reviewed the radiologic studies     ED Course:  Patient's stay here was uneventful and given her clinical evaluation and objective findings I felt that she had head trauma with postconcussive syndrome. She does not have any evidence of subdural or epidural hematoma. There is no cerebral contusions or evidence of a cerebrovascular accident. Patient has a long history of near syncope versus syncopal episodes and also some history of vertigo. Patient's been advised to follow up with her primary physician and was discharged with instructions on postconcussive syndrome. All questions and concerns were addressed at the bedside. I felt her syncope was unlikely to be from a cardiac arrhythmia. The patient felt symptomatically improved with a 500 mL bolus of normal saline and may have some dehydration that's not evident on her laboratory  work at this time   Assessment:  Syncopal episode Postconcussive syndrome      Plan:  Outpatient management Patient was advised to return immediately if condition worsens. Patient was advised to follow up with their primary care physician or other specialized physicians involved in their outpatient care. The patient and/or family member/power of attorney had laboratory results reviewed at the bedside. All questions and concerns were addressed and appropriate discharge instructions were distributed by the nursing staff.            Daymon Larsen, MD 03/28/15 [phone]

## 2015-03-28 NOTE — ED Notes (Signed)
Patient unable to void at this time

## 2015-03-28 NOTE — ED Notes (Signed)
Discussed pt with dr Corky Downs. No CT at this time.

## 2015-03-28 NOTE — ED Notes (Addendum)
Pt has syncopal episode 2 weeks ago; pt did hit head when passed out.  Has been having near syncope episodes on and off for a while.  Reports headaches on and off since fall 2 weeks ago and has had more trouble than normal walking since fall.  Legs seem weaker since fall per pt.  Pt states " i do have inner ear trouble".  Has been seeing neurology for the near syncope episodes but has not informed them of these sx since her last fall 2 weeks ago.  Gets dizzy when laying flat.  No slurred speech or facial droop noted in triage.  Reports her vision is off sometimes.  She also reports normally gets near passing out but can sit down and does not pass all the way out.

## 2015-06-19 ENCOUNTER — Other Ambulatory Visit: Payer: Self-pay | Admitting: Family Medicine

## 2015-06-19 DIAGNOSIS — K869 Disease of pancreas, unspecified: Secondary | ICD-10-CM

## 2015-06-27 ENCOUNTER — Ambulatory Visit
Admission: RE | Admit: 2015-06-27 | Discharge: 2015-06-27 | Disposition: A | Discharge status: Home / Self Care | Payer: Medicare Other | Source: Ambulatory Visit | Attending: Family Medicine | Admitting: Family Medicine

## 2015-06-27 DIAGNOSIS — K869 Disease of pancreas, unspecified: Secondary | ICD-10-CM

## 2015-06-27 MED ORDER — IOPAMIDOL (ISOVUE-300) INJECTION 61%
100.0000 mL | Freq: Once | INTRAVENOUS | Status: AC | PRN
  Administered 2015-06-27: 100 mL via INTRAVENOUS

## 2015-11-18 ENCOUNTER — Other Ambulatory Visit (HOSPITAL_COMMUNITY): Payer: Self-pay

## 2015-12-09 ENCOUNTER — Other Ambulatory Visit: Payer: Self-pay | Admitting: Family Medicine

## 2015-12-09 ENCOUNTER — Other Ambulatory Visit: Payer: Self-pay

## 2015-12-09 ENCOUNTER — Ambulatory Visit (HOSPITAL_COMMUNITY): Payer: Medicare Other | Attending: Internal Medicine

## 2015-12-09 DIAGNOSIS — I350 Nonrheumatic aortic (valve) stenosis: Secondary | ICD-10-CM | POA: Diagnosis not present

## 2015-12-09 DIAGNOSIS — R0600 Dyspnea, unspecified: Secondary | ICD-10-CM | POA: Diagnosis not present

## 2016-05-10 DIAGNOSIS — F419 Anxiety disorder, unspecified: Secondary | ICD-10-CM | POA: Diagnosis not present

## 2016-05-10 DIAGNOSIS — R4182 Altered mental status, unspecified: Secondary | ICD-10-CM | POA: Diagnosis not present

## 2016-05-10 DIAGNOSIS — R634 Abnormal weight loss: Secondary | ICD-10-CM | POA: Diagnosis not present

## 2016-05-10 DIAGNOSIS — E039 Hypothyroidism, unspecified: Secondary | ICD-10-CM | POA: Diagnosis not present

## 2016-06-20 ENCOUNTER — Encounter: Payer: Self-pay | Admitting: Emergency Medicine

## 2016-06-20 ENCOUNTER — Emergency Department
Admission: EM | Admit: 2016-06-20 | Discharge: 2016-06-20 | Disposition: A | Discharge status: Home / Self Care | Payer: Medicare Other | Attending: Emergency Medicine | Admitting: Emergency Medicine

## 2016-06-20 ENCOUNTER — Emergency Department: Payer: Medicare Other

## 2016-06-20 DIAGNOSIS — E039 Hypothyroidism, unspecified: Secondary | ICD-10-CM | POA: Diagnosis not present

## 2016-06-20 DIAGNOSIS — W19XXXA Unspecified fall, initial encounter: Secondary | ICD-10-CM

## 2016-06-20 DIAGNOSIS — Y9389 Activity, other specified: Secondary | ICD-10-CM | POA: Diagnosis not present

## 2016-06-20 DIAGNOSIS — R220 Localized swelling, mass and lump, head: Secondary | ICD-10-CM | POA: Diagnosis not present

## 2016-06-20 DIAGNOSIS — J449 Chronic obstructive pulmonary disease, unspecified: Secondary | ICD-10-CM | POA: Diagnosis not present

## 2016-06-20 DIAGNOSIS — Z79899 Other long term (current) drug therapy: Secondary | ICD-10-CM | POA: Diagnosis not present

## 2016-06-20 DIAGNOSIS — S0990XA Unspecified injury of head, initial encounter: Secondary | ICD-10-CM | POA: Insufficient documentation

## 2016-06-20 DIAGNOSIS — Y998 Other external cause status: Secondary | ICD-10-CM | POA: Diagnosis not present

## 2016-06-20 DIAGNOSIS — M7989 Other specified soft tissue disorders: Secondary | ICD-10-CM | POA: Diagnosis not present

## 2016-06-20 DIAGNOSIS — Y92000 Kitchen of unspecified non-institutional (private) residence as  the place of occurrence of the external cause: Secondary | ICD-10-CM | POA: Insufficient documentation

## 2016-06-20 DIAGNOSIS — Z23 Encounter for immunization: Secondary | ICD-10-CM | POA: Diagnosis not present

## 2016-06-20 DIAGNOSIS — W01198A Fall on same level from slipping, tripping and stumbling with subsequent striking against other object, initial encounter: Secondary | ICD-10-CM | POA: Diagnosis not present

## 2016-06-20 DIAGNOSIS — Z043 Encounter for examination and observation following other accident: Secondary | ICD-10-CM | POA: Diagnosis not present

## 2016-06-20 NOTE — ED Provider Notes (Signed)
[facility] Emergency Department Provider Note  ____________________________________________  Time seen: Approximately 5:03 PM  I have reviewed the triage vital signs and the nursing notes.   HISTORY  Chief Complaint Fall    HPI [name] is a 83 y.o. female presents to the emergency department with 2 out of 10 headache after patient fell against a cabinet in her kitchen yesterday. Patient did not lose consciousness. She denies new blurry vision, chest pain, chest tightness, nausea and vomiting. Patient is accompanied by her daughter, who has noticed no acute changes in behavior or disorientation. Patient has been ambulating without difficulty. Patient has been taking Aleve. Patient enjoys working puzzles and going to local department stores for shopping   Past Medical History:  Diagnosis Date  . Anxiety   . Arthritis    "hands" (10/24/2012)  . B12 deficiency    "get shots twice/month" (10/24/2012)  . Bursitis   . Depression   . Diverticulitis   . Emphysema dx 2012  . Exertional shortness of breath   . Headache(784.0)    "weekly at least" (10/24/2012)  . Hypothyroidism   . Insomnia   . Ovarian cancer ([place])    mass hooked to sm. intestine/bladder  . Skin cancer of face   . Syncope    when neck is up or turned to side  . Syncope and collapse    "first time I ever I collapsed"; confirms h/o presyncopal events (10/24/2012)  . UTI (lower urinary tract infection)     Patient Active Problem List   Diagnosis Date Noted  . Syncope and collapse 10/24/2012  . Orthostatic hypotension 10/24/2012  . Weakness generalized 01/21/2011  . Vertigo 01/21/2011  . COPD (chronic obstructive pulmonary disease) ([place]) 01/21/2011  . HYPOTHYROIDISM 03/28/2008  . JAW PAIN 03/28/2008  . DYSPNEA 03/28/2008    Past Surgical History:  Procedure Laterality Date  . ABDOMINAL HYSTERECTOMY    . CATARACT EXTRACTION W/ INTRAOCULAR LENS  IMPLANT, BILATERAL Bilateral  2000's  . DILATION AND CURETTAGE OF UTERUS     several  . FOOT SURGERY Right 1943-1944   4 surgeries  . THYROIDECTOMY    . TONSILLECTOMY      Prior to Admission medications   Medication Sig Start Date End Date Taking? Authorizing Provider  ALPRAZolam (XANAX) 0.25 MG tablet Take 0.25 mg by mouth 2 (two) times daily.  03/18/15   [provider]  Cholecalciferol (VITAMIN D-3 PO) Take 1 tablet by mouth daily.    [provider]  Cyanocobalamin (VITAMIN B-12 IJ) Inject 1 vial as directed every 21 ( twenty-one) days.     [provider]  escitalopram (LEXAPRO) 20 MG tablet Take 1 tablet by mouth daily. 03/18/15   [provider]  levothyroxine (SYNTHROID, LEVOTHROID) 100 MCG tablet Take 100 mcg by mouth daily.    [provider]  naproxen sodium (ANAPROX) 220 MG tablet Take 220 mg by mouth daily as needed (pain).    [provider]  temazepam (RESTORIL) 15 MG capsule Take 1 capsule by mouth at bedtime as needed for sleep.  03/18/15   [provider]  traZODone (DESYREL) 50 MG tablet Take 50 mg by mouth at bedtime as needed for sleep.    [provider]    Allergies Penicillins and Prednisone  History reviewed. No pertinent family history.  Social History Social History  Substance Use Topics  . Smoking status: Never Smoker  . Smokeless tobacco: Never Used  . Alcohol use No  Review of Systems  Constitutional: No fever/chills Eyes: No visual changes. No discharge ENT: No upper respiratory complaints. Cardiovascular: no chest pain. Respiratory: no cough. No SOB. Gastrointestinal: No abdominal pain.  No nausea, no vomiting.  No diarrhea.  No constipation. Musculoskeletal: patient had mild right shoulder soreness. Skin: Negative for rash, abrasions, lacerations, ecchymosis. Neurological: patient has mild headache, no focal weakness or numbness.   ____________________________________________   PHYSICAL  EXAM:  VITAL SIGNS: ED Triage Vitals  Enc Vitals Group     BP 06/20/16 1409 (!) 147/73     Pulse Rate 06/20/16 1408 73     Resp 06/20/16 1408 18     Temp 06/20/16 1408 98.4 F (36.9 C)     Temp Source 06/20/16 1408 Oral     SpO2 06/20/16 1408 98 %     Weight 06/20/16 1409 112 lb (50.8 kg)     Height 06/20/16 1409 5\' 2"  (1.575 m)     Head Circumference --      Peak Flow --      Pain Score 06/20/16 1407 2     Pain Loc --      Pain Edu? --      Excl. in [place]? --      Constitutional: Alert and oriented. Well appearing and in no acute distress. Eyes: Conjunctivae are normal. PERRL. EOMI. Head: Atraumatic. Neck: full range of motion. No pain was elicited with palpation along the cervical spine. Hematological/Lymphatic/Immunilogical: No cervical lymphadenopathy. Cardiovascular: Normal rate, regular rhythm. Normal S1 and S2.  Good peripheral circulation. Respiratory: Normal respiratory effort without tachypnea or retractions. Lungs CTAB. Good air entry to the bases with no decreased or absent breath sounds. Musculoskeletal: Patient has 5/5 strength in the upper and lower extremities bilaterally. Full range of motion at the shoulder, elbow and wrist bilaterally. Full range of motion at the hip, knee and ankle bilaterally. Palpable radial, ulnar and dorsalis pedis pulses bilaterally and symmetrically. Neurologic:  Normal speech and language. No gross focal neurologic deficits are appreciated.  Skin:  Skin is warm, dry and intact. No rash noted. Psychiatric: Mood and affect are normal. Speech and behavior are normal. Patient exhibits appropriate insight and judgement.   ____________________________________________   LABS (all labs ordered are listed, but only abnormal results are displayed)  Labs Reviewed - No data to display ____________________________________________  EKG   ____________________________________________  RADIOLOGY Unk Pinto, personally viewed and  evaluated these images (plain radiographs) as part of my medical decision making, as well as reviewing the written report by the radiologist.  Ct Head Wo Contrast  Result Date: 06/20/2016 CLINICAL DATA:  Right posterior head swelling after falling yesterday and hitting her head. Increased somnolence today. History of ovarian cancer. EXAM: CT HEAD WITHOUT CONTRAST CT CERVICAL SPINE WITHOUT CONTRAST TECHNIQUE: Multidetector CT imaging of the head and cervical spine was performed following the standard protocol without intravenous contrast. Multiplanar CT image reconstructions of the cervical spine were also generated. COMPARISON:  Head CT dated 03/28/2015. FINDINGS: CT HEAD FINDINGS Brain: Diffusely enlarged ventricles and subarachnoid spaces. Patchy white matter low density in both cerebral hemispheres. No intracranial hemorrhage, mass lesion or CT evidence of acute infarction. Vascular: No hyperdense vessel or unexpected calcification. Skull: Normal. Negative for fracture or focal lesion. Sinuses/Orbits: Normally pneumatized paranasal sinuses. Status post bilateral cataract surgery. Other: None. CT CERVICAL SPINE FINDINGS Alignment: Mild anterolisthesis at the C4-5 level. Skull base and vertebrae: No acute fracture. No primary bone lesion or focal pathologic process. Soft tissues  and spinal canal: No prevertebral fluid or swelling. No visible canal hematoma. Disc levels: Multilevel degenerative changes. These include facet degenerative changes at multiple levels, most pronounced at the C4-5 level on the left. Upper chest: Mild biapical pleural and parenchymal scarring with bilateral pleural calcification. Other: Mild left carotid artery calcification. IMPRESSION: 1. No skull fracture or intracranial hemorrhage. 2. No cervical spine fracture or traumatic subluxation. 3. Stable mild diffuse cerebral and cerebellar atrophy. 4. Mildly progressive mild chronic small vessel white matter ischemic changes in both  cerebral hemispheres. 5. Multilevel cervical spine degenerative changes with associated mild anterolisthesis at the C4-5 level. 6. Mild left carotid artery atheromatous calcification. Electronically Signed   By: Claudie Revering M.D.   On: 06/20/2016 14:30   Ct Cervical Spine Wo Contrast  Result Date: 06/20/2016 CLINICAL DATA:  Right posterior head swelling after falling yesterday and hitting her head. Increased somnolence today. History of ovarian cancer. EXAM: CT HEAD WITHOUT CONTRAST CT CERVICAL SPINE WITHOUT CONTRAST TECHNIQUE: Multidetector CT imaging of the head and cervical spine was performed following the standard protocol without intravenous contrast. Multiplanar CT image reconstructions of the cervical spine were also generated. COMPARISON:  Head CT dated 03/28/2015. FINDINGS: CT HEAD FINDINGS Brain: Diffusely enlarged ventricles and subarachnoid spaces. Patchy white matter low density in both cerebral hemispheres. No intracranial hemorrhage, mass lesion or CT evidence of acute infarction. Vascular: No hyperdense vessel or unexpected calcification. Skull: Normal. Negative for fracture or focal lesion. Sinuses/Orbits: Normally pneumatized paranasal sinuses. Status post bilateral cataract surgery. Other: None. CT CERVICAL SPINE FINDINGS Alignment: Mild anterolisthesis at the C4-5 level. Skull base and vertebrae: No acute fracture. No primary bone lesion or focal pathologic process. Soft tissues and spinal canal: No prevertebral fluid or swelling. No visible canal hematoma. Disc levels: Multilevel degenerative changes. These include facet degenerative changes at multiple levels, most pronounced at the C4-5 level on the left. Upper chest: Mild biapical pleural and parenchymal scarring with bilateral pleural calcification. Other: Mild left carotid artery calcification. IMPRESSION: 1. No skull fracture or intracranial hemorrhage. 2. No cervical spine fracture or traumatic subluxation. 3. Stable mild diffuse  cerebral and cerebellar atrophy. 4. Mildly progressive mild chronic small vessel white matter ischemic changes in both cerebral hemispheres. 5. Multilevel cervical spine degenerative changes with associated mild anterolisthesis at the C4-5 level. 6. Mild left carotid artery atheromatous calcification. Electronically Signed   By: Claudie Revering M.D.   On: 06/20/2016 14:30    ____________________________________________    PROCEDURES  Procedure(s) performed:    Procedures    Medications - No data to display   ____________________________________________   INITIAL IMPRESSION / ASSESSMENT AND PLAN / ED COURSE  Pertinent labs & imaging results that were available during my care of the patient were reviewed by me and considered in my medical decision making (see chart for details).  Review of the [place] CSRS was performed in accordance of the [place] prior to dispensing any controlled drugs.     Assessment and plan: Fall, initial encounter:  Patient presents to the emergency department with 2 out of 10 posterior headache after patient fell against a cabinet in her home. CT head and CT cervical spine reveal no acute intracranial abnormalities or acute fractures. Neurologic exam and overall physical exam was reassuring.Patient declined pain medication in the emergency department. Patient intends to continue taking Aleve as needed for pain and inflammation. Vital signs were reassuring prior to discharge. All patient questions were answered.    ____________________________________________  FINAL CLINICAL IMPRESSION(S) /  ED DIAGNOSES  Final diagnoses:  Fall, initial encounter      NEW MEDICATIONS STARTED DURING THIS VISIT:  New Prescriptions   No medications on file        This chart was dictated using voice recognition software/Dragon. Despite best efforts to proofread, errors can occur which can change the meaning. Any change was purely unintentional.    Lannie Fields,  PA-C 06/20/16 1711    Nance Pear, MD 06/20/16 [phone]

## 2016-06-20 NOTE — ED Triage Notes (Signed)
Pt was stepping down off something yesterday and fell.  Hit head falling. Swelling to right posterior head.  Has had mild shoulder pain but pt does not want xray; can move xray.  No LOC. No blood thinners.  Alert and oriented in triage.

## 2016-06-20 NOTE — ED Notes (Signed)
NAD noted at time of D/C. Pt taken to the lobby via wheelchair by this RN. Pt and daughter deny and questions or concerns at this time.

## 2016-07-08 DIAGNOSIS — M25551 Pain in right hip: Secondary | ICD-10-CM | POA: Diagnosis not present

## 2016-07-08 DIAGNOSIS — M25571 Pain in right ankle and joints of right foot: Secondary | ICD-10-CM | POA: Diagnosis not present

## 2016-08-12 DIAGNOSIS — I10 Essential (primary) hypertension: Secondary | ICD-10-CM | POA: Diagnosis not present

## 2016-08-12 DIAGNOSIS — Z0000 Encounter for general adult medical examination without abnormal findings: Secondary | ICD-10-CM | POA: Diagnosis not present

## 2016-08-12 DIAGNOSIS — E785 Hyperlipidemia, unspecified: Secondary | ICD-10-CM | POA: Diagnosis not present

## 2016-08-23 DIAGNOSIS — Z1231 Encounter for screening mammogram for malignant neoplasm of breast: Secondary | ICD-10-CM | POA: Diagnosis not present

## 2016-08-23 DIAGNOSIS — Z803 Family history of malignant neoplasm of breast: Secondary | ICD-10-CM | POA: Diagnosis not present

## 2016-11-15 ENCOUNTER — Telehealth: Payer: Self-pay | Admitting: Primary Care

## 2016-11-15 DIAGNOSIS — Z23 Encounter for immunization: Secondary | ICD-10-CM | POA: Diagnosis not present

## 2016-11-15 DIAGNOSIS — M810 Age-related osteoporosis without current pathological fracture: Secondary | ICD-10-CM | POA: Diagnosis not present

## 2016-11-15 DIAGNOSIS — H6121 Impacted cerumen, right ear: Secondary | ICD-10-CM | POA: Diagnosis not present

## 2017-06-06 DIAGNOSIS — M545 Low back pain: Secondary | ICD-10-CM | POA: Diagnosis not present

## 2017-06-20 DIAGNOSIS — E559 Vitamin D deficiency, unspecified: Secondary | ICD-10-CM | POA: Diagnosis not present

## 2017-06-20 DIAGNOSIS — R531 Weakness: Secondary | ICD-10-CM | POA: Diagnosis not present

## 2017-06-20 DIAGNOSIS — Z79899 Other long term (current) drug therapy: Secondary | ICD-10-CM | POA: Diagnosis not present

## 2017-06-20 DIAGNOSIS — M545 Low back pain: Secondary | ICD-10-CM | POA: Diagnosis not present

## 2017-06-20 DIAGNOSIS — H6121 Impacted cerumen, right ear: Secondary | ICD-10-CM | POA: Diagnosis not present

## 2017-06-20 DIAGNOSIS — R5383 Other fatigue: Secondary | ICD-10-CM | POA: Diagnosis not present

## 2017-07-04 ENCOUNTER — Other Ambulatory Visit: Payer: Self-pay | Admitting: Sports Medicine

## 2017-07-04 DIAGNOSIS — M545 Low back pain: Secondary | ICD-10-CM

## 2017-07-04 DIAGNOSIS — W010XXA Fall on same level from slipping, tripping and stumbling without subsequent striking against object, initial encounter: Secondary | ICD-10-CM | POA: Diagnosis not present

## 2017-07-11 ENCOUNTER — Encounter (INDEPENDENT_AMBULATORY_CARE_PROVIDER_SITE_OTHER): Payer: Self-pay

## 2017-07-11 ENCOUNTER — Ambulatory Visit
Admission: RE | Admit: 2017-07-11 | Discharge: 2017-07-11 | Disposition: A | Discharge status: Home / Self Care | Payer: Medicare Other | Source: Ambulatory Visit | Attending: Orthopaedic Surgery | Admitting: Orthopaedic Surgery

## 2017-07-11 ENCOUNTER — Ambulatory Visit (INDEPENDENT_AMBULATORY_CARE_PROVIDER_SITE_OTHER): Payer: Self-pay

## 2017-07-11 ENCOUNTER — Ambulatory Visit (INDEPENDENT_AMBULATORY_CARE_PROVIDER_SITE_OTHER): Payer: Medicare Other | Admitting: Orthopaedic Surgery

## 2017-07-11 ENCOUNTER — Encounter (INDEPENDENT_AMBULATORY_CARE_PROVIDER_SITE_OTHER): Payer: Self-pay | Admitting: Orthopaedic Surgery

## 2017-07-11 VITALS — BP 116/68 | Resp 16 | Ht 60.0 in | Wt 110.0 lb

## 2017-07-11 DIAGNOSIS — M545 Low back pain, unspecified: Secondary | ICD-10-CM

## 2017-07-11 DIAGNOSIS — M48061 Spinal stenosis, lumbar region without neurogenic claudication: Secondary | ICD-10-CM | POA: Diagnosis not present

## 2017-07-11 DIAGNOSIS — M546 Pain in thoracic spine: Secondary | ICD-10-CM

## 2017-07-11 DIAGNOSIS — R0781 Pleurodynia: Secondary | ICD-10-CM

## 2017-07-11 NOTE — Progress Notes (Signed)
Office Visit Note   Patient: [name]           Date of Birth: 10-May-1928           MRN: [id number] Visit Date: 07/11/2017              Requested by: Donna Rasmussen, MD [address] [place], [place] [address] PCP: Donna Rasmussen, MD   Assessment & Plan: Visit Diagnoses:  1. Rib pain on right side   2. Acute midline low back pain without sciatica   3. Pain in thoracic spine     Plan: We will plan MRI scan of lumbar spine to include T11.  Follow-up shortly thereafter to determine disposition.  Office visit over 1 hour discussing potential diagnosis treatment options with over 50% of the time in counseling.  I did review chest and rib films with radiologist who felt there was no chest pathology other than the potential T12 compression fracture  Follow-Up Instructions: No follow-ups on file.   Orders:  Orders Placed This Encounter  Procedures  . XR Ribs Unilateral Right  . XR Lumbar Spine 2-3 Views  . XR Thoracic Spine 2 View   No orders of the defined types were placed in this encounter.     Procedures: No procedures performed   Clinical Data: No additional findings.   Subjective: Chief Complaint  Patient presents with  . Right Shoulder - Pain  . Shoulder Pain    Fell x 2, pain on right side, right shoulder, right hip, right hip, x-rays at Urgent Care in [place], worsening, Tylenol helps some, difficulty sleeping at night, short of breath  [name] has fallen several times over the past several weeks with onset of right sided rib pain thoracic and lumbar spine discomfort.  She apparently was seen at an urgent care facility in [place].  Initial evaluation was negative for any fractures.  We do not have any of that information or images to review.  She does have a problem with balance and uses a rolling walker.  She was not having any trouble with shortness of breath or chest pain prior to the fall but since then has had difficulty with  right-sided rib pain with some shortness of breath.  Has not had any particular lower extremity pain is been able to ambulate without any trouble in reference to hit her hips and her knees.  She has experienced some pain in the middle of her back and even the lower part of her back but without radiculopathy  HPI  Review of Systems  Constitutional: Positive for activity change and fatigue.  HENT: Positive for trouble swallowing.   Eyes: Negative for visual disturbance.  Respiratory: Positive for shortness of breath.   Cardiovascular: Positive for leg swelling.  Gastrointestinal: Negative for constipation.  Endocrine: Positive for cold intolerance.  Genitourinary: Positive for urgency.  Musculoskeletal: Positive for back pain, gait problem, joint swelling, neck pain and neck stiffness.  Skin: Negative for rash.  Allergic/Immunologic: Negative for food allergies.  Neurological: Positive for dizziness and weakness.  Psychiatric/Behavioral: Positive for sleep disturbance.     Objective: Vital Signs: BP 116/68 (BP Location: Left Arm, Patient Position: Sitting, Cuff Size: Normal)   Resp 16   Ht 5' (1.524 m)   Wt 110 lb (49.9 kg)   BMI 21.48 kg/m   Physical Exam  Constitutional: She is oriented to person, place, and time. She appears well-developed and well-nourished.  HENT:  Mouth/Throat: Oropharynx  is clear and moist.  Eyes: Pupils are equal, round, and reactive to light. EOM are normal.  Pulmonary/Chest: Effort normal.  Neurological: She is alert and oriented to person, place, and time.  Skin: Skin is warm and dry.  Psychiatric: She has a normal mood and affect. Her behavior is normal.    Ortho Exam awake alert and oriented x3.  Comfortable sitting.  Accompanied by her daughter.  Does use a rolling walker for ambulation.  She does hold onto her right rib cage when she takes a deep breath or coughs.  No crepitation.  Some percussible tenderness in the lower thoracic and mid lumbar  spine.  Straight leg raise negative.  No pain with pelvic compression.  Painless range of motion both hips Specialty Comments:  No specialty comments available.  Imaging: No results found.   PMFS History: Patient Active Problem List   Diagnosis Date Noted  . Syncope and collapse 10/24/2012  . Orthostatic hypotension 10/24/2012  . Weakness generalized 01/21/2011  . Vertigo 01/21/2011  . COPD (chronic obstructive pulmonary disease) ([place]) 01/21/2011  . HYPOTHYROIDISM 03/28/2008  . JAW PAIN 03/28/2008  . DYSPNEA 03/28/2008   Past Medical History:  Diagnosis Date  . Anxiety   . Arthritis    "hands" (10/24/2012)  . B12 deficiency    "get shots twice/month" (10/24/2012)  . Bursitis   . Depression   . Diverticulitis   . Emphysema dx 2012  . Exertional shortness of breath   . Headache(784.0)    "weekly at least" (10/24/2012)  . Hypothyroidism   . Insomnia   . Ovarian cancer ([place])    mass hooked to sm. intestine/bladder  . Skin cancer of face   . Syncope    when neck is up or turned to side  . Syncope and collapse    "first time I ever I collapsed"; confirms h/o presyncopal events (10/24/2012)  . UTI (lower urinary tract infection)     History reviewed. No pertinent family history.  Past Surgical History:  Procedure Laterality Date  . ABDOMINAL HYSTERECTOMY    . CATARACT EXTRACTION W/ INTRAOCULAR LENS  IMPLANT, BILATERAL Bilateral 2000's  . DILATION AND CURETTAGE OF UTERUS     several  . FOOT SURGERY Right 1943-1944   4 surgeries  . THYROIDECTOMY    . TONSILLECTOMY     Social History   Occupational History  . Not on file  Tobacco Use  . Smoking status: Never Smoker  . Smokeless tobacco: Never Used  Substance and Sexual Activity  . Alcohol use: No  . Drug use: No  . Sexual activity: Never

## 2017-07-12 ENCOUNTER — Ambulatory Visit (INDEPENDENT_AMBULATORY_CARE_PROVIDER_SITE_OTHER): Payer: Self-pay | Admitting: Orthopaedic Surgery

## 2017-07-12 ENCOUNTER — Other Ambulatory Visit (INDEPENDENT_AMBULATORY_CARE_PROVIDER_SITE_OTHER): Payer: Self-pay | Admitting: *Deleted

## 2017-07-12 DIAGNOSIS — S22000A Wedge compression fracture of unspecified thoracic vertebra, initial encounter for closed fracture: Secondary | ICD-10-CM

## 2017-07-18 ENCOUNTER — Other Ambulatory Visit (HOSPITAL_COMMUNITY): Payer: Self-pay | Admitting: Interventional Radiology

## 2017-07-18 DIAGNOSIS — S22080A Wedge compression fracture of T11-T12 vertebra, initial encounter for closed fracture: Secondary | ICD-10-CM

## 2017-07-19 ENCOUNTER — Ambulatory Visit: Payer: 59

## 2017-07-20 ENCOUNTER — Telehealth: Payer: Self-pay | Admitting: *Deleted

## 2017-07-20 NOTE — Telephone Encounter (Signed)
Can you please write and sign a prescription so Roxy can bring it in tomorrow? Thank you.

## 2017-07-20 NOTE — Telephone Encounter (Signed)
Will do!

## 2017-07-20 NOTE — Telephone Encounter (Signed)
Hydrocodone with tylenol 5/325 1 tab po q3-4 h prn

## 2017-07-20 NOTE — Telephone Encounter (Signed)
Patient's daughter called, patient in severe pain, appt w/Dr. Juliet Rude 08/03/17. Can you give her something for pain? [facility]. Patient is currently taking Tylenol which is not helping. Thank you.

## 2017-07-21 ENCOUNTER — Other Ambulatory Visit (INDEPENDENT_AMBULATORY_CARE_PROVIDER_SITE_OTHER): Payer: Self-pay | Admitting: Radiology

## 2017-07-21 MED ORDER — HYDROCODONE-ACETAMINOPHEN 5-325 MG PO TABS: ORAL_TABLET | ORAL | 0 refills | Status: DC

## 2017-07-22 ENCOUNTER — Ambulatory Visit (HOSPITAL_COMMUNITY)
Admission: RE | Admit: 2017-07-22 | Discharge: 2017-07-22 | Disposition: A | Discharge status: Home / Self Care | Payer: Medicare Other | Source: Ambulatory Visit | Attending: Interventional Radiology | Admitting: Interventional Radiology

## 2017-07-22 DIAGNOSIS — S22080A Wedge compression fracture of T11-T12 vertebra, initial encounter for closed fracture: Secondary | ICD-10-CM | POA: Diagnosis not present

## 2017-07-22 NOTE — Consult Note (Signed)
Chief Complaint: Patient was seen in consultation today for thoracic 11 compression fracture.  Referring Physician(s): Garald Balding  Supervising Physician: Corrie Mckusick  Patient Status: [facility] - Out-pt  History of Present Illness: [name] is a 84 y.o. female with a past medical history of emphysema, syncope, diverticulitis, hypothyroidism, ovarian cancer, arthritis, bursitis, insomnia, B12 deficiency, depression, and anxiety. She has fallen several times in the past few weeks. She developed sudden onset of right-sided rib pain and lower back pain after one of her falls. She saw her orthopedist, Dr. Durward Fortes, for management of this pain. He ordered an MRI for evaluation.  MRI lumbar spine 07/11/2017: 1. T11 compression fracture with progression of height loss since 07/11/2017 and moderate marrow edema. 2. Severe facet hypertrophy at L4-5 and L5-S1, which may be a source of local low back pain. Moderate right L4-5 foraminal stenosis. 3. Moderate left L2-3 foraminal stenosis secondary to disc osteophyte complex. 4. These results will be called to the ordering clinician or representative by the Radiologist Assistant, and communication documented in the PACS or zVision Dashboard.  IR requested by Dr. Durward Fortes for management of thoracic 11 compression fracture. Patient awake and alert sitting in chair. Accompanied by daughter. Complains of lower back pain. States that she currently is not in pain, as she is sitting still. States moving and riding in cars increases pain. Rates pain 7/10 when it is at its worse. States she takes Tylenol OTC for pain control. States that her activity has significantly decreased since the pain started. Denies fever, chills, numbness/tingling down legs, saddle anesthesia, bladder/bowel incontinence, urinary retention, or dysuria. She does not take blood thinners.   Past Medical History:  Diagnosis Date  . Anxiety   . Arthritis    "hands"  (10/24/2012)  . B12 deficiency    "get shots twice/month" (10/24/2012)  . Bursitis   . Depression   . Diverticulitis   . Emphysema dx 2012  . Exertional shortness of breath   . Headache(784.0)    "weekly at least" (10/24/2012)  . Hypothyroidism   . Insomnia   . Ovarian cancer ([place])    mass hooked to sm. intestine/bladder  . Skin cancer of face   . Syncope    when neck is up or turned to side  . Syncope and collapse    "first time I ever I collapsed"; confirms h/o presyncopal events (10/24/2012)  . UTI (lower urinary tract infection)     Past Surgical History:  Procedure Laterality Date  . ABDOMINAL HYSTERECTOMY    . CATARACT EXTRACTION W/ INTRAOCULAR LENS  IMPLANT, BILATERAL Bilateral 2000's  . DILATION AND CURETTAGE OF UTERUS     several  . FOOT SURGERY Right 1943-1944   4 surgeries  . THYROIDECTOMY    . TONSILLECTOMY      Allergies: Penicillins and Prednisone  Medications: Prior to Admission medications   Medication Sig Start Date End Date Taking? Authorizing Provider  alendronate (FOSAMAX) 70 MG tablet Take 70 mg by mouth once a week. Take with a full glass of water on an empty stomach.    [provider]  ALPRAZolam Duanne Moron) 0.25 MG tablet Take 0.25 mg by mouth 2 (two) times daily.  03/18/15   [provider]  baclofen (LIORESAL) 10 MG tablet Take 10 mg by mouth. Take 1/2 tablet by mouth 3 times daily for pain    [provider]  Cholecalciferol (VITAMIN D-3 PO) Take 1 tablet by mouth daily.    [provider]  Cholecalciferol (VITAMIN D3) 100000 UNIT/GM POWD Take by mouth.    [provider]  Cyanocobalamin (VITAMIN B-12 IJ) Inject 1 vial as directed every 21 ( twenty-one) days.     [provider]  escitalopram (LEXAPRO) 20 MG tablet Take 1 tablet by mouth daily. 03/18/15   [provider]  HYDROcodone-acetaminophen (NORCO/VICODIN) 5-325 MG tablet I PO Q 3-4 HRS PRN PAIN 07/21/17   Garald Balding, MD    levothyroxine (SYNTHROID, LEVOTHROID) 100 MCG tablet Take 100 mcg by mouth daily.    [provider]  naproxen sodium (ANAPROX) 220 MG tablet Take 220 mg by mouth daily as needed (pain).    [provider]  temazepam (RESTORIL) 15 MG capsule Take 1 capsule by mouth at bedtime as needed for sleep.  03/18/15   [provider]  traZODone (DESYREL) 50 MG tablet Take 50 mg by mouth at bedtime as needed for sleep.    [provider]     No family history on file.  Social History   Socioeconomic History  . Marital status: Widowed    Spouse name: Not on file  . Number of children: Not on file  . Years of education: Not on file  . Highest education level: Not on file  Occupational History  . Not on file  Social Needs  . Financial resource strain: Not on file  . Food insecurity:    Worry: Not on file    Inability: Not on file  . Transportation needs:    Medical: Not on file    Non-medical: Not on file  Tobacco Use  . Smoking status: Never Smoker  . Smokeless tobacco: Never Used  Substance and Sexual Activity  . Alcohol use: No  . Drug use: No  . Sexual activity: Never  Lifestyle  . Physical activity:    Days per week: Not on file    Minutes per session: Not on file  . Stress: Not on file  Relationships  . Social connections:    Talks on phone: Not on file    Gets together: Not on file    Attends religious service: Not on file    Active member of club or organization: Not on file    Attends meetings of clubs or organizations: Not on file    Relationship status: Not on file  Other Topics Concern  . Not on file  Social History Narrative  . Not on file     Review of Systems: A 12 point ROS discussed and pertinent positives are indicated in the HPI above.  All other systems are negative.  Review of Systems  Constitutional: Negative for chills and fever.  Respiratory: Negative for shortness of breath and wheezing.   Cardiovascular:  Negative for chest pain and palpitations.  Gastrointestinal:       Negative for bowel incontinence.  Genitourinary: Negative for difficulty urinating and dysuria.       Negative for bladder incontinence.  Neurological: Negative for numbness.  Psychiatric/Behavioral: Negative for behavioral problems and confusion.    Vital Signs: There were no vitals taken for this visit.  Physical Exam  Constitutional: She is oriented to person, place, and time. She appears well-developed and well-nourished. No distress.  Cardiovascular: Normal rate, regular rhythm, normal heart sounds and intact distal pulses.  No murmur heard. Pulmonary/Chest: Effort normal and breath sounds normal. No stridor. No respiratory distress.  Musculoskeletal:  Reproducible point tenderness at approximate level of thoracic 11.  Neurological: She is alert and  oriented to person, place, and time.  Skin: Skin is warm and dry.  Psychiatric: She has a normal mood and affect. Her behavior is normal. Judgment and thought content normal.  Nursing note and vitals reviewed.    Imaging: Mr Lumbar Spine W/o Contrast  Result Date: 07/11/2017 CLINICAL DATA:  Midline low back pain. EXAM: MRI LUMBAR SPINE WITHOUT CONTRAST TECHNIQUE: Multiplanar, multisequence MR imaging of the lumbar spine was performed. No intravenous contrast was administered. COMPARISON:  Lumbar spine radiograph 07/11/2017 FINDINGS: Segmentation:  Standard. Alignment:  Right convex scoliosis apex at L2. Vertebrae: Progression of compression deformity at T11 with moderate marrow edema. No discitis-osteomyelitis. No other focal marrow signal abnormality. Conus medullaris and cauda equina: Conus extends to the L2 level. Conus and cauda equina appear normal. Paraspinal and other soft tissues: Negative. Disc levels: T9-T10: Normal. T10-T11: Small right subarticular disc protrusion without stenosis. T11-T12: Normal. T12-L1: Normal. L1-L2: Minimal disc bulge without stenosis.  L2-L3: Small disc osteophyte complex with moderate left foraminal stenosis. L3-L4: Mild disc bulge with endplate spurring. No spinal canal stenosis. Mild bilateral foraminal narrowing. L4-L5: Disc space narrowing and severe slow facet hypertrophy. No spinal canal stenosis. Moderate right foraminal stenosis. L5-S1: Disc bulge and severe facet hypertrophy. No spinal canal stenosis. No foraminal stenosis. IMPRESSION: 1. T11 compression fracture with progression of height loss since 07/11/2017 and moderate marrow edema. 2. Severe facet hypertrophy at L4-5 and L5-S1, which may be a source of local low back pain. Moderate right L4-5 foraminal stenosis. 3. Moderate left L2-3 foraminal stenosis secondary to disc osteophyte complex. 4. These results will be called to the ordering clinician or representative by the Radiologist Assistant, and communication documented in the PACS or zVision Dashboard. Electronically Signed   By: Ulyses Jarred M.D.   On: 07/11/2017 23:35   Xr Ribs Unilateral Right  Result Date: 07/11/2017 Films of right ribs were negative for any fracture  Xr Thoracic Spine 2 View  Result Date: 07/11/2017 Films of the thoracic spine revealed diffuse degenerative changes.  I believe there is a compression fracture of T11 representing approximately 20% compression  Xr Lumbar Spine 2-3 Views  Result Date: 07/11/2017 Films of the lumbar spine reveal degenerative right scoliosis approximately 10 to 15 degrees.  There appears to be a partial compression of L3.  Diffuse osteopenia.  No listhesis.  Facet joint arthritis at L3-4 L4-5 and L5-S1.   Labs:  CBC: No results for input(s): WBC, HGB, HCT, PLT in the last 8760 hours.  COAGS: No results for input(s): INR, APTT in the last 8760 hours.  BMP: No results for input(s): NA, K, CL, CO2, GLUCOSE, BUN, CALCIUM, CREATININE, GFRNONAA, GFRAA in the last 8760 hours.  Invalid input(s): CMP  LIVER FUNCTION TESTS: No results for input(s): BILITOT, AST,  ALT, ALKPHOS, PROT, ALBUMIN in the last 8760 hours.  TUMOR MARKERS: No results for input(s): AFPTM, CEA, CA199, CHROMGRNA in the last 8760 hours.  Assessment and Plan:  Thoracic 11 compression fracture. Informed patient that she is the ideal candidate for a thoracic 11 kyphoplasty/vertebroplasty. Informed patient that this is an outpatient procedure. Explained that the benefit/goal of this procedure is to decrease her pain. Discussed risks of procedure, including bleeding, infection, nerve injury, and migration of cement. Explained what to expect on procedure day, including that she will be NPO the midnight before procedure day and we use moderate sedation for this procedure.  Patient and daughter state that they want this procedure done as soon as possible. Dr. Earleen Newport explained that  we must first check her insurance, and are anticipating that she will be scheduled sometime next week. Dr. Earleen Newport explained that his partners also do this procedure, and if patient wishes to get it done ASAP, it might have to be with one of his partners due to scheduling. Patient and daughter state that they understand, and wish to have it done ASAP by any of Dr. Pasty Arch partners.  Plan for follow-up thoracic 11 kyphoplasty/vertebroplasty ASAP. Informed patient that our schedulers will call her to schedule this procedure once it is cleared by insurance.  All questions answered and concerns addressed. Patient and daughter convey understanding and agree with plan.  Thank you for this interesting consult.  I greatly enjoyed meeting [name] and look forward to participating in their care.  A copy of this report was sent to the requesting provider on this date.  Electronically Signed: Earley Abide, PA-C 07/22/2017, 8:56 AM   I spent a total of 30 Minutes in face to face in clinical consultation, greater than 50% of which was counseling/coordinating care for thoracic 11 compression fracture.

## 2017-07-25 ENCOUNTER — Other Ambulatory Visit (HOSPITAL_COMMUNITY): Payer: Self-pay | Admitting: Interventional Radiology

## 2017-07-25 DIAGNOSIS — S22080A Wedge compression fracture of T11-T12 vertebra, initial encounter for closed fracture: Secondary | ICD-10-CM

## 2017-07-26 ENCOUNTER — Other Ambulatory Visit: Payer: Self-pay | Admitting: Radiology

## 2017-07-27 ENCOUNTER — Ambulatory Visit (HOSPITAL_COMMUNITY)
Admission: RE | Admit: 2017-07-27 | Discharge: 2017-07-27 | Disposition: A | Discharge status: Home / Self Care | Payer: Medicare Other | Source: Ambulatory Visit | Attending: Interventional Radiology | Admitting: Interventional Radiology

## 2017-07-27 ENCOUNTER — Encounter (HOSPITAL_COMMUNITY): Payer: Self-pay

## 2017-07-27 DIAGNOSIS — E538 Deficiency of other specified B group vitamins: Secondary | ICD-10-CM | POA: Insufficient documentation

## 2017-07-27 DIAGNOSIS — M19049 Primary osteoarthritis, unspecified hand: Secondary | ICD-10-CM | POA: Insufficient documentation

## 2017-07-27 DIAGNOSIS — Z87440 Personal history of urinary (tract) infections: Secondary | ICD-10-CM | POA: Insufficient documentation

## 2017-07-27 DIAGNOSIS — Z9889 Other specified postprocedural states: Secondary | ICD-10-CM | POA: Insufficient documentation

## 2017-07-27 DIAGNOSIS — I10 Essential (primary) hypertension: Secondary | ICD-10-CM | POA: Insufficient documentation

## 2017-07-27 DIAGNOSIS — M4854XA Collapsed vertebra, not elsewhere classified, thoracic region, initial encounter for fracture: Secondary | ICD-10-CM | POA: Diagnosis not present

## 2017-07-27 DIAGNOSIS — Z9842 Cataract extraction status, left eye: Secondary | ICD-10-CM | POA: Diagnosis not present

## 2017-07-27 DIAGNOSIS — Z85828 Personal history of other malignant neoplasm of skin: Secondary | ICD-10-CM | POA: Diagnosis not present

## 2017-07-27 DIAGNOSIS — Z880 Allergy status to penicillin: Secondary | ICD-10-CM | POA: Insufficient documentation

## 2017-07-27 DIAGNOSIS — Z9841 Cataract extraction status, right eye: Secondary | ICD-10-CM | POA: Insufficient documentation

## 2017-07-27 DIAGNOSIS — Z90710 Acquired absence of both cervix and uterus: Secondary | ICD-10-CM | POA: Insufficient documentation

## 2017-07-27 DIAGNOSIS — F419 Anxiety disorder, unspecified: Secondary | ICD-10-CM | POA: Insufficient documentation

## 2017-07-27 DIAGNOSIS — J439 Emphysema, unspecified: Secondary | ICD-10-CM | POA: Insufficient documentation

## 2017-07-27 DIAGNOSIS — Z79890 Hormone replacement therapy (postmenopausal): Secondary | ICD-10-CM | POA: Insufficient documentation

## 2017-07-27 DIAGNOSIS — S22080A Wedge compression fracture of T11-T12 vertebra, initial encounter for closed fracture: Secondary | ICD-10-CM | POA: Insufficient documentation

## 2017-07-27 DIAGNOSIS — D759 Disease of blood and blood-forming organs, unspecified: Secondary | ICD-10-CM | POA: Diagnosis not present

## 2017-07-27 DIAGNOSIS — Z888 Allergy status to other drugs, medicaments and biological substances status: Secondary | ICD-10-CM | POA: Insufficient documentation

## 2017-07-27 DIAGNOSIS — G4700 Insomnia, unspecified: Secondary | ICD-10-CM | POA: Insufficient documentation

## 2017-07-27 DIAGNOSIS — Z79899 Other long term (current) drug therapy: Secondary | ICD-10-CM | POA: Diagnosis not present

## 2017-07-27 DIAGNOSIS — E039 Hypothyroidism, unspecified: Secondary | ICD-10-CM | POA: Insufficient documentation

## 2017-07-27 DIAGNOSIS — Z8543 Personal history of malignant neoplasm of ovary: Secondary | ICD-10-CM | POA: Insufficient documentation

## 2017-07-27 DIAGNOSIS — F329 Major depressive disorder, single episode, unspecified: Secondary | ICD-10-CM | POA: Diagnosis not present

## 2017-07-27 HISTORY — PX: IR KYPHO THORACIC WITH BONE BIOPSY: IMG5518

## 2017-07-27 LAB — CBC
HEMATOCRIT: 37.9 % (ref 36.0–46.0)
HEMOGLOBIN: 12.2 g/dL (ref 12.0–15.0)
MCH: 30 pg (ref 26.0–34.0)
MCHC: 32.2 g/dL (ref 30.0–36.0)
MCV: 93.3 fL (ref 78.0–100.0)
PLATELETS: UNDETERMINED 10*3/uL (ref 150–400)
RBC: 4.06 MIL/uL (ref 3.87–5.11)
RDW: 13.2 % (ref 11.5–15.5)
WBC: 4.6 10*3/uL (ref 4.0–10.5)

## 2017-07-27 LAB — BASIC METABOLIC PANEL
Anion gap: 9 (ref 5–15)
BUN: 8 mg/dL (ref 8–23)
CO2: 28 mmol/L (ref 22–32)
CREATININE: 0.67 mg/dL (ref 0.44–1.00)
Calcium: 9.6 mg/dL (ref 8.9–10.3)
Chloride: 103 mmol/L (ref 98–111)
Glucose, Bld: 90 mg/dL (ref 70–99)
Potassium: 3.8 mmol/L (ref 3.5–5.1)
SODIUM: 140 mmol/L (ref 135–145)

## 2017-07-27 LAB — PROTIME-INR
INR: 1.02
Prothrombin Time: 13.3 seconds (ref 11.4–15.2)

## 2017-07-27 MED ORDER — VANCOMYCIN HCL IN DEXTROSE 1-5 GM/200ML-% IV SOLN
1000.0000 mg | INTRAVENOUS | Status: AC
  Administered 2017-07-27: 1000 mg via INTRAVENOUS

## 2017-07-27 MED ORDER — LIDOCAINE HCL 1 % IJ SOLN
INTRAMUSCULAR | Status: AC
  Filled 2017-07-27: qty 20

## 2017-07-27 MED ORDER — LIDOCAINE HCL (PF) 1 % IJ SOLN
INTRAMUSCULAR | Status: AC | PRN
  Administered 2017-07-27: 15 mL

## 2017-07-27 MED ORDER — MIDAZOLAM HCL 2 MG/2ML IJ SOLN
INTRAMUSCULAR | Status: AC
  Filled 2017-07-27: qty 2

## 2017-07-27 MED ORDER — SODIUM CHLORIDE 0.9 % IV SOLN: INTRAVENOUS | Status: DC

## 2017-07-27 MED ORDER — MIDAZOLAM HCL 2 MG/2ML IJ SOLN
INTRAMUSCULAR | Status: AC | PRN
  Administered 2017-07-27: 0.5 mg via INTRAVENOUS

## 2017-07-27 MED ORDER — VANCOMYCIN HCL IN DEXTROSE 1-5 GM/200ML-% IV SOLN
INTRAVENOUS | Status: AC
  Administered 2017-07-27: 1000 mg via INTRAVENOUS
  Filled 2017-07-27: qty 200

## 2017-07-27 MED ORDER — FENTANYL CITRATE (PF) 100 MCG/2ML IJ SOLN
INTRAMUSCULAR | Status: AC | PRN
  Administered 2017-07-27: 12.5 ug via INTRAVENOUS
  Administered 2017-07-27 (×2): 25 ug via INTRAVENOUS
  Administered 2017-07-27: 12.5 ug via INTRAVENOUS

## 2017-07-27 MED ORDER — IOPAMIDOL (ISOVUE-300) INJECTION 61%
INTRAVENOUS | Status: AC
  Administered 2017-07-27: 1 mL
  Filled 2017-07-27: qty 50

## 2017-07-27 MED ORDER — FENTANYL CITRATE (PF) 100 MCG/2ML IJ SOLN
INTRAMUSCULAR | Status: AC
  Filled 2017-07-27: qty 2

## 2017-07-27 NOTE — Procedures (Signed)
Interventional Radiology Procedure Note  Procedure: T11 kyphoplasty with bone biopsy  Complications: None  Estimated Blood Loss: None  Recommendations: - Path is pending - Bedrest x 3 hours  Signed,  Criselda Peaches, MD

## 2017-07-27 NOTE — Discharge Instructions (Signed)
Percutaneous Vertebroplasty, Care After °These instructions give you information on caring for yourself after your procedure. Your doctor may also give you more specific instructions. Call your doctor if you have any problems or questions after your procedure. °Follow these instructions at home: °· Take medicine as told by your doctor. °· Keep your wound dry and covered for 24 hours or as told by your doctor. °· Ask your doctor when you can bathe or shower. °· Put an ice pack on your wound. °? Put ice in a plastic bag. °? Place a towel between your skin and the bag. °? Leave the ice on for 15-20 minutes, 3-4 times a day. °· Rest in your bed for 24 hours or as told by your doctor. °· Return to normal activities as told by your doctor. °· Ask your doctor what stretches and exercises you can do. °· Do not bend or lift anything heavy as told by your doctor. °Contact a doctor if: °· Your wound becomes red, puffy (swollen), or tender to the touch. °· You are bleeding or leaking fluid from the wound. °· You are sick to your stomach (nauseous) or throw up (vomit) for more than 24 hours after the procedure. °· Your back pain does not get better. °· You have a fever. °Get help right away if: °· You have bad back pain that comes on suddenly. °· You cannot control when you pee (urinate) or poop (bowel movement). °· You lose feeling (numbness) or have tingling in your legs or feet, or they become weak. °· You have sudden weakness in your arms or legs. °· You have shooting pain down your legs. °· You have chest pain or a hard time breathing. °· You feel dizzy or pass out (faint). °· Your vision changes or you cannot talk as you normally do. °This information is not intended to replace advice given to you by your health care provider. Make sure you discuss any questions you have with your health care provider. °Document Released: 03/24/2009 Document Revised: 06/05/2015 Document Reviewed: 09/05/2012 °Elsevier Interactive Patient  Education © 2018 [employer] °Balloon Kyphoplasty, Care After °Refer to this sheet in the next few weeks. These instructions provide you with information about caring for yourself after your procedure. Your health care provider may also give you more specific instructions. Your treatment has been planned according to current medical practices, but problems sometimes occur. Call your health care provider if you have any problems or questions after your procedure. °What can I expect after the procedure? °After your procedure, it is common to have back pain. °Follow these instructions at home: °Incision care °· Follow instructions from your health care provider about how to take care of your incisions. Make sure you: °? Wash your hands with soap and water before you change your bandage (dressing). If soap and water are not available, use hand sanitizer. °? Change your dressing as told by your health care provider. °? Leave stitches (sutures), skin glue, or adhesive strips in place. These skin closures may need to be in place for 2 weeks or longer. If adhesive strip edges start to loosen and curl up, you may trim the loose edges. Do not remove adhesive strips completely unless your health care provider tells you to do that. °· Check your incision area every day for signs of infection. Watch for: °? Redness, swelling, or pain. °? Fluid, blood, or pus. °· Keep your dressing dry until your health care provider says that it can be removed. °  Activity ° °· Rest your back and avoid intense physical activity for as long as told by your health care provider. °· Return to your normal activities as told by your health care provider. Ask your health care provider what activities are safe for you. °· Do not lift anything that is heavier than 10 lb (4.5 kg). This is about the weight of a gallon of milk. You may need to avoid heavy lifting for several weeks. °General instructions °· Take over-the-counter and prescription medicines  only as told by your health care provider. °· If directed, apply ice to the painful area: °? Put ice in a plastic bag. °? Place a towel between your skin and the bag. °? Leave the ice on for 20 minutes, 2-3 times per day. °· Do not use tobacco products, including cigarettes, chewing tobacco, or e-cigarettes. If you need help quitting, ask your health care provider. °· Keep all follow-up visits as told by your health care provider. This is important. °Contact a health care provider if: °· You have a fever. °· You have redness, swelling, or pain at the site of your incisions. °· You have fluid, blood, or pus coming from your incisions. °· You have pain that gets worse or does not get better with medicine. °· You develop numbness or weakness in any part of your body. °Get help right away if: °· You have chest pain. °· You have difficulty breathing. °· You cannot move your legs. °· You cannot control your bladder or bowel movements. °· You suddenly become weak or numb on one side of your body. °· You become very confused. °· You have trouble speaking or understanding, or both. °This information is not intended to replace advice given to you by your health care provider. Make sure you discuss any questions you have with your health care provider. °Document Released: 09/18/2014 Document Revised: 06/05/2015 Document Reviewed: 04/22/2014 °Elsevier Interactive Patient Education © 2018 [employer] °Moderate Conscious Sedation, Adult, Care After °These instructions provide you with information about caring for yourself after your procedure. Your health care provider may also give you more specific instructions. Your treatment has been planned according to current medical practices, but problems sometimes occur. Call your health care provider if you have any problems or questions after your procedure. °What can I expect after the procedure? °After your procedure, it is common: °· To feel sleepy for several hours. °· To feel  clumsy and have poor balance for several hours. °· To have poor judgment for several hours. °· To vomit if you eat too soon. ° °Follow these instructions at home: °For at least 24 hours after the procedure: ° °· Do not: °? Participate in activities where you could fall or become injured. °? Drive. °? Use heavy machinery. °? Drink alcohol. °? Take sleeping pills or medicines that cause drowsiness. °? Make important decisions or sign legal documents. °? Take care of children on your own. °· Rest. °Eating and drinking °· Follow the diet recommended by your health care provider. °· If you vomit: °? Drink water, juice, or soup when you can drink without vomiting. °? Make sure you have little or no nausea before eating solid foods. °General instructions °· Have a responsible adult stay with you until you are awake and alert. °· Take over-the-counter and prescription medicines only as told by your health care provider. °· If you smoke, do not smoke without supervision. °· Keep all follow-up visits as told by your health care provider. This is   important. °Contact a health care provider if: °· You keep feeling nauseous or you keep vomiting. °· You feel light-headed. °· You develop a rash. °· You have a fever. °Get help right away if: °· You have trouble breathing. °This information is not intended to replace advice given to you by your health care provider. Make sure you discuss any questions you have with your health care provider. °Document Released: 10/18/2012 Document Revised: 06/02/2015 Document Reviewed: 04/19/2015 °Elsevier Interactive Patient Education © 2018 [employer]. ° °

## 2017-07-27 NOTE — H&P (Addendum)
Chief Complaint: Patient was seen in consultation today for T11 compression fracture  Supervising Physician: Jacqulynn Cadet  Patient Status: [facility] - Out-pt  History of Present Illness: [name] is a 84 y.o. female with past medical history of emphysema, arthritis, depression, anxiety who was recently seen in Interventional Radiology clinic with Dr. Earleen Newport for T11 compression fracture. At that time, treatment options including vertebroplasty/kyphoplasty were discussed and patient elected to proceed. She presents to radiology department today in her usual state of health; denies fever, chills, cough, shortness of breath, abdominal pain, nausea, dysuria.   She has been NPO.  She does not take blood thinners.   Past Medical History:  Diagnosis Date  . Anxiety   . Arthritis    "hands" (10/24/2012)  . B12 deficiency    "get shots twice/month" (10/24/2012)  . Bursitis   . Depression   . Diverticulitis   . Emphysema dx 2012  . Exertional shortness of breath   . Headache(784.0)    "weekly at least" (10/24/2012)  . Hypothyroidism   . Insomnia   . Ovarian cancer ([place])    mass hooked to sm. intestine/bladder  . Skin cancer of face   . Syncope    when neck is up or turned to side  . Syncope and collapse    "first time I ever I collapsed"; confirms h/o presyncopal events (10/24/2012)  . UTI (lower urinary tract infection)     Past Surgical History:  Procedure Laterality Date  . ABDOMINAL HYSTERECTOMY    . CATARACT EXTRACTION W/ INTRAOCULAR LENS  IMPLANT, BILATERAL Bilateral 2000's  . DILATION AND CURETTAGE OF UTERUS     several  . FOOT SURGERY Right 1943-1944   4 surgeries  . THYROIDECTOMY    . TONSILLECTOMY      Allergies: Penicillins and Prednisone  Medications: Prior to Admission medications   Medication Sig Start Date End Date Taking? Authorizing Provider  acetaminophen (TYLENOL) 650 MG CR tablet Take 650 mg by mouth every 8 (eight) hours as needed for  pain.    Yes [provider]  alendronate (FOSAMAX) 70 MG tablet Take 70 mg by mouth once a week. Take with a full glass of water on an empty stomach.   Yes [provider]  Cholecalciferol (VITAMIN D) 2000 units tablet Take 2,000 Units by mouth daily.   Yes [provider]  escitalopram (LEXAPRO) 10 MG tablet Take 10 mg by mouth daily.   Yes [provider]  HYDROcodone-acetaminophen (NORCO/VICODIN) 5-325 MG tablet I PO Q 3-4 HRS PRN PAIN Patient taking differently: Take 1 tablet by mouth every 4 (four) hours as needed for severe pain.  07/21/17  Yes Garald Balding, MD  levothyroxine (SYNTHROID, LEVOTHROID) 100 MCG tablet Take 100 mcg by mouth daily before breakfast.    Yes [provider]  vitamin B-12 (CYANOCOBALAMIN) 1000 MCG tablet Take 1,000 mcg by mouth daily.   Yes [provider]     History reviewed. No pertinent family history.  Social History   Socioeconomic History  . Marital status: Widowed    Spouse name: Not on file  . Number of children: Not on file  . Years of education: Not on file  . Highest education level: Not on file  Occupational History  . Not on file  Social Needs  . Financial resource strain: Not on file  . Food insecurity:    Worry: Not on file    Inability: Not on file  . Transportation needs:  Medical: Not on file    Non-medical: Not on file  Tobacco Use  . Smoking status: Never Smoker  . Smokeless tobacco: Never Used  Substance and Sexual Activity  . Alcohol use: No  . Drug use: No  . Sexual activity: Never  Lifestyle  . Physical activity:    Days per week: Not on file    Minutes per session: Not on file  . Stress: Not on file  Relationships  . Social connections:    Talks on phone: Not on file    Gets together: Not on file    Attends religious service: Not on file    Active member of club or organization: Not on file    Attends meetings of clubs or organizations: Not on file     Relationship status: Not on file  Other Topics Concern  . Not on file  Social History Narrative  . Not on file     Review of Systems: A 12 point ROS discussed and pertinent positives are indicated in the HPI above.  All other systems are negative.  Review of Systems  Constitutional: Negative for fatigue and fever.  Respiratory: Negative for cough and shortness of breath.   Cardiovascular: Negative for chest pain.  Gastrointestinal: Negative for abdominal pain.  Musculoskeletal: Negative for back pain.  Psychiatric/Behavioral: Negative for behavioral problems and confusion.    Vital Signs: BP (!) 150/68   Pulse 65   Temp 98.1 F (36.7 C) (Oral)   Resp 16   Ht '5\' 3"'  (1.6 m)   Wt 110 lb (49.9 kg)   SpO2 99%   BMI 19.49 kg/m   Physical Exam  Constitutional: She is oriented to person, place, and time. She appears well-developed.  Cardiovascular: Normal rate, regular rhythm and normal heart sounds.  Pulmonary/Chest: Effort normal and breath sounds normal. No respiratory distress.  Abdominal: Soft.  Musculoskeletal: She exhibits tenderness (mid back).  Neurological: She is alert and oriented to person, place, and time.  Skin: Skin is warm and dry.  Psychiatric: She has a normal mood and affect. Her behavior is normal. Judgment and thought content normal.  Nursing note and vitals reviewed.    MD Evaluation Airway: WNL Heart: WNL Abdomen: WNL Chest/ Lungs: WNL ASA  Classification: 3 Mallampati/Airway Score: Two   Imaging: Mr Lumbar Spine W/o Contrast  Result Date: 07/11/2017 CLINICAL DATA:  Midline low back pain. EXAM: MRI LUMBAR SPINE WITHOUT CONTRAST TECHNIQUE: Multiplanar, multisequence MR imaging of the lumbar spine was performed. No intravenous contrast was administered. COMPARISON:  Lumbar spine radiograph 07/11/2017 FINDINGS: Segmentation:  Standard. Alignment:  Right convex scoliosis apex at L2. Vertebrae: Progression of compression deformity at T11 with moderate  marrow edema. No discitis-osteomyelitis. No other focal marrow signal abnormality. Conus medullaris and cauda equina: Conus extends to the L2 level. Conus and cauda equina appear normal. Paraspinal and other soft tissues: Negative. Disc levels: T9-T10: Normal. T10-T11: Small right subarticular disc protrusion without stenosis. T11-T12: Normal. T12-L1: Normal. L1-L2: Minimal disc bulge without stenosis. L2-L3: Small disc osteophyte complex with moderate left foraminal stenosis. L3-L4: Mild disc bulge with endplate spurring. No spinal canal stenosis. Mild bilateral foraminal narrowing. L4-L5: Disc space narrowing and severe slow facet hypertrophy. No spinal canal stenosis. Moderate right foraminal stenosis. L5-S1: Disc bulge and severe facet hypertrophy. No spinal canal stenosis. No foraminal stenosis. IMPRESSION: 1. T11 compression fracture with progression of height loss since 07/11/2017 and moderate marrow edema. 2. Severe facet hypertrophy at L4-5 and L5-S1, which may be a  source of local low back pain. Moderate right L4-5 foraminal stenosis. 3. Moderate left L2-3 foraminal stenosis secondary to disc osteophyte complex. 4. These results will be called to the ordering clinician or representative by the Radiologist Assistant, and communication documented in the PACS or zVision Dashboard. Electronically Signed   By: Ulyses Jarred M.D.   On: 07/11/2017 23:35   Xr Ribs Unilateral Right  Result Date: 07/11/2017 Films of right ribs were negative for any fracture  Xr Thoracic Spine 2 View  Result Date: 07/11/2017 Films of the thoracic spine revealed diffuse degenerative changes.  I believe there is a compression fracture of T11 representing approximately 20% compression  Xr Lumbar Spine 2-3 Views  Result Date: 07/11/2017 Films of the lumbar spine reveal degenerative right scoliosis approximately 10 to 15 degrees.  There appears to be a partial compression of L3.  Diffuse osteopenia.  No listhesis.  Facet joint  arthritis at L3-4 L4-5 and L5-S1.   Labs:  CBC: Recent Labs    07/27/17 0747  WBC PENDING  HGB 12.2  HCT 37.9  PLT PENDING    COAGS: Recent Labs    07/27/17 0747  INR 1.02    BMP: Recent Labs    07/27/17 0810  NA 140  K 3.8  CL 103  CO2 28  GLUCOSE 90  BUN 8  CALCIUM 9.6  CREATININE 0.67  GFRNONAA >60  GFRAA >60    LIVER FUNCTION TESTS: No results for input(s): BILITOT, AST, ALT, ALKPHOS, PROT, ALBUMIN in the last 8760 hours.  TUMOR MARKERS: No results for input(s): AFPTM, CEA, CA199, CHROMGRNA in the last 8760 hours.  Assessment and Plan: Patient with past medical history of arthritis, HTN presents with complaint of T11 compression fracutre.  Patient and family have met with Dr. Earleen Newport in clinic and have decided to pursue vertebroplasty/kyphoplasty.  They understand procedure scheduled with Dr. Laurence Ferrari today. Patient presents today in their usual state of health.  She has been NPO and is not currently on blood thinners.   Risks and benefits were discussed with the patient including, but not limited to education regarding the natural healing process of compression fractures without intervention, bleeding, infection, cement migration which may cause spinal cord damage, paralysis, pulmonary embolism or even death.  This interventional procedure involves the use of X-rays and because of the nature of the planned procedure, it is possible that we will have prolonged use of X-ray fluoroscopy.  Potential radiation risks to you include (but are not limited to) the following: - A slightly elevated risk for cancer several years later in life. This risk is typically less than 0.5% percent. This risk is low in comparison to the normal incidence of human cancer, which is 33% for women and 50% for men according to the [employer]. - Radiation induced injury can include skin redness, resembling a rash, tissue breakdown / ulcers and hair loss (which can be  temporary or permanent).   The likelihood of either of these occurring depends on the difficulty of the procedure and whether you are sensitive to radiation due to previous procedures, disease, or genetic conditions.   IF your procedure requires a prolonged use of radiation, you will be notified and given written instructions for further action.  It is your responsibility to monitor the irradiated area for the 2 weeks following the procedure and to notify your physician if you are concerned that you have suffered a radiation induced injury.    All of the patient's questions were  answered, patient is agreeable to proceed.  Consent signed and in chart.  Thank you for this interesting consult.  I greatly enjoyed meeting REAGHAN KAWA and look forward to participating in their care.  A copy of this report was sent to the requesting provider on this date.  Electronically Signed: Docia Barrier, PA 07/27/2017, 8:55 AM   I spent a total of  15 Minutes   in face to face in clinical consultation, greater than 50% of which was counseling/coordinating care for T11 compression fracture.

## 2017-07-30 ENCOUNTER — Telehealth: Payer: Self-pay | Admitting: Radiology

## 2017-07-30 NOTE — Progress Notes (Signed)
7/17 procedure: IMPRESSION: 1. Status post fluoroscopic-guided needle placement for deep core bone biopsy at T11. 2. Status post vertebral body augmentation using balloon kyphoplasty at T11 as described without event.  pts daughter has called and pt is now having pain on left side  Pain originally had been right Procedure was tolerated well Good pain relief for 24-48 hrs Now painful  Denies redness; denies fever Site is clean and dry NT no bleeding No swelling  Pain in left back - continually  Rec: heat pad Continue Hydrocodone regularly  If still no relief- consider ED We will call Mon to check with her  Daughter Sunday Spillers is agreeable

## 2017-08-01 ENCOUNTER — Emergency Department (HOSPITAL_COMMUNITY): Payer: Medicare Other

## 2017-08-01 ENCOUNTER — Emergency Department (HOSPITAL_COMMUNITY)
Admission: EM | Admit: 2017-08-01 | Discharge: 2017-08-01 | Disposition: A | Discharge status: Home / Self Care | Payer: Medicare Other | Attending: Emergency Medicine | Admitting: Emergency Medicine

## 2017-08-01 ENCOUNTER — Encounter (HOSPITAL_COMMUNITY): Payer: Self-pay | Admitting: Radiology

## 2017-08-01 DIAGNOSIS — R0602 Shortness of breath: Secondary | ICD-10-CM | POA: Insufficient documentation

## 2017-08-01 DIAGNOSIS — M546 Pain in thoracic spine: Secondary | ICD-10-CM | POA: Insufficient documentation

## 2017-08-01 DIAGNOSIS — R0902 Hypoxemia: Secondary | ICD-10-CM | POA: Diagnosis not present

## 2017-08-01 DIAGNOSIS — M5410 Radiculopathy, site unspecified: Secondary | ICD-10-CM | POA: Diagnosis not present

## 2017-08-01 DIAGNOSIS — M79662 Pain in left lower leg: Secondary | ICD-10-CM | POA: Diagnosis not present

## 2017-08-01 DIAGNOSIS — M8448XA Pathological fracture, other site, initial encounter for fracture: Secondary | ICD-10-CM | POA: Diagnosis not present

## 2017-08-01 DIAGNOSIS — E039 Hypothyroidism, unspecified: Secondary | ICD-10-CM | POA: Diagnosis not present

## 2017-08-01 DIAGNOSIS — M549 Dorsalgia, unspecified: Secondary | ICD-10-CM | POA: Diagnosis present

## 2017-08-01 DIAGNOSIS — E162 Hypoglycemia, unspecified: Secondary | ICD-10-CM | POA: Diagnosis not present

## 2017-08-01 DIAGNOSIS — M5416 Radiculopathy, lumbar region: Secondary | ICD-10-CM | POA: Diagnosis not present

## 2017-08-01 DIAGNOSIS — I10 Essential (primary) hypertension: Secondary | ICD-10-CM | POA: Diagnosis not present

## 2017-08-01 DIAGNOSIS — E161 Other hypoglycemia: Secondary | ICD-10-CM | POA: Diagnosis not present

## 2017-08-01 DIAGNOSIS — Z79899 Other long term (current) drug therapy: Secondary | ICD-10-CM | POA: Diagnosis not present

## 2017-08-01 DIAGNOSIS — R109 Unspecified abdominal pain: Secondary | ICD-10-CM | POA: Diagnosis not present

## 2017-08-01 DIAGNOSIS — J449 Chronic obstructive pulmonary disease, unspecified: Secondary | ICD-10-CM | POA: Diagnosis not present

## 2017-08-01 LAB — CBC WITH DIFFERENTIAL/PLATELET
Abs Immature Granulocytes: 0 10*3/uL (ref 0.0–0.1)
Basophils Absolute: 0 10*3/uL (ref 0.0–0.1)
Basophils Relative: 1 %
Eosinophils Absolute: 0.1 10*3/uL (ref 0.0–0.7)
Eosinophils Relative: 2 %
HCT: 35.7 % — ABNORMAL LOW (ref 36.0–46.0)
Hemoglobin: 11.4 g/dL — ABNORMAL LOW (ref 12.0–15.0)
Immature Granulocytes: 0 %
Lymphocytes Relative: 43 %
Lymphs Abs: 1.8 10*3/uL (ref 0.7–4.0)
MCH: 30 pg (ref 26.0–34.0)
MCHC: 31.9 g/dL (ref 30.0–36.0)
MCV: 93.9 fL (ref 78.0–100.0)
Monocytes Absolute: 0.4 10*3/uL (ref 0.1–1.0)
Monocytes Relative: 9 %
Neutro Abs: 1.9 10*3/uL (ref 1.7–7.7)
Neutrophils Relative %: 45 %
Platelets: 174 10*3/uL (ref 150–400)
RBC: 3.8 MIL/uL — ABNORMAL LOW (ref 3.87–5.11)
RDW: 13.4 % (ref 11.5–15.5)
WBC: 4.2 10*3/uL (ref 4.0–10.5)

## 2017-08-01 LAB — BASIC METABOLIC PANEL
Anion gap: 10 (ref 5–15)
BUN: 9 mg/dL (ref 8–23)
CO2: 24 mmol/L (ref 22–32)
Calcium: 9 mg/dL (ref 8.9–10.3)
Chloride: 103 mmol/L (ref 98–111)
Creatinine, Ser: 0.69 mg/dL (ref 0.44–1.00)
GFR calc Af Amer: >60 mL/min (ref >60)
GFR calc non Af Amer: >60 mL/min (ref >60)
Glucose, Bld: 92 mg/dL (ref 70–99)
Potassium: 3.9 mmol/L (ref 3.5–5.1)
Sodium: 137 mmol/L (ref 135–145)

## 2017-08-01 LAB — I-STAT TROPONIN, ED: Troponin i, poc: 0 ng/mL (ref 0.00–0.08)

## 2017-08-01 LAB — D-DIMER, QUANTITATIVE (NOT AT ARMC): D-Dimer, Quant: 0.93 ug/mL-FEU — ABNORMAL HIGH (ref 0.00–0.50)

## 2017-08-01 MED ORDER — METHYLPREDNISOLONE 4 MG PO TBPK: ORAL_TABLET | ORAL | 0 refills | Status: DC

## 2017-08-01 MED ORDER — OXYCODONE HCL 5 MG PO TABS: 2.5000 mg | ORAL_TABLET | ORAL | 0 refills | Status: AC | PRN

## 2017-08-01 MED ORDER — IOPAMIDOL (ISOVUE-370) INJECTION 76%
INTRAVENOUS | Status: AC
  Administered 2017-08-01: 100 mL
  Filled 2017-08-01: qty 100

## 2017-08-01 MED ORDER — MORPHINE SULFATE (PF) 4 MG/ML IV SOLN
2.0000 mg | Freq: Once | INTRAVENOUS | Status: AC
  Administered 2017-08-01: 2 mg via INTRAVENOUS
  Filled 2017-08-01: qty 1

## 2017-08-01 MED ORDER — DEXAMETHASONE SODIUM PHOSPHATE 10 MG/ML IJ SOLN
5.0000 mg | Freq: Once | INTRAMUSCULAR | Status: AC
  Administered 2017-08-01: 5 mg via INTRAVENOUS
  Filled 2017-08-01: qty 1

## 2017-08-01 NOTE — ED Triage Notes (Signed)
Near syncopal episode just PTA; reports pain to left flank area rad to LLE; also endorses "pins and needle tIngling" to LLE; recent back sx (percutaneous vertebroplasty) - unk level

## 2017-08-01 NOTE — ED Provider Notes (Addendum)
[facility] EMERGENCY DEPARTMENT Provider Note   CSN: [id number] Arrival date & time: 08/01/17  1319     History   Chief Complaint Chief Complaint  Patient presents with  . Fall    HPI [name] is a 84 y.o. female with history of anxiety, arthritis, B12 deficiency, diverticulitis, emphysema/COPD, hypothyroidism presents for evaluation of acute onset, intermittent left-sided back pain with left lower extremity paresthesias for 4 days.  She is 6 days status post IR guided T11 balloon kyphoplasty and biopsy.  She states that she tolerated the procedure well and had significant improvement in her right-sided pain for 2 days.  On Friday she woke up with left-sided back pain and "pins-and-needles "to the left lower extremity radiating to the foot.  Pain occurs primarily with ambulation and bending, is sharp in nature.  She states that it improves with rest and laying flat.  She has been taking the pain medicines that she was provided and applying heating pad with mild temporary relief.  She denies saddle anesthesia, bowel or bladder incontinence, fevers, or history of IV drug use.  She does endorse shortness of breath but states "that has been going on for a long time ".  She states that it did worsen after the procedure but she denies any chest pains.  Shortness of breath usually worsens with activity.  She denies cough, abdominal pain, nausea, or vomiting.  She denies lightheadedness, syncope, or headaches.  The history is provided by the patient.    Past Medical History:  Diagnosis Date  . Anxiety   . Arthritis    "hands" (10/24/2012)  . B12 deficiency    "get shots twice/month" (10/24/2012)  . Bursitis   . Depression   . Diverticulitis   . Emphysema dx 2012  . Exertional shortness of breath   . Headache(784.0)    "weekly at least" (10/24/2012)  . Hypothyroidism   . Insomnia   . Ovarian cancer ([place])    mass hooked to sm. intestine/bladder  . Skin cancer of  face   . Syncope    when neck is up or turned to side  . Syncope and collapse    "first time I ever I collapsed"; confirms h/o presyncopal events (10/24/2012)  . UTI (lower urinary tract infection)     Patient Active Problem List   Diagnosis Date Noted  . Syncope and collapse 10/24/2012  . Orthostatic hypotension 10/24/2012  . Weakness generalized 01/21/2011  . Vertigo 01/21/2011  . COPD (chronic obstructive pulmonary disease) ([place]) 01/21/2011  . HYPOTHYROIDISM 03/28/2008  . JAW PAIN 03/28/2008  . DYSPNEA 03/28/2008    Past Surgical History:  Procedure Laterality Date  . ABDOMINAL HYSTERECTOMY    . CATARACT EXTRACTION W/ INTRAOCULAR LENS  IMPLANT, BILATERAL Bilateral 2000's  . DILATION AND CURETTAGE OF UTERUS     several  . FOOT SURGERY Right 1943-1944   4 surgeries  . IR KYPHO THORACIC WITH BONE BIOPSY  07/27/2017  . THYROIDECTOMY    . TONSILLECTOMY       OB History   None      Home Medications    Prior to Admission medications   Medication Sig Start Date End Date Taking? Authorizing Provider  acetaminophen (TYLENOL) 650 MG CR tablet Take 650 mg by mouth every 8 (eight) hours as needed for pain.     [provider]  alendronate (FOSAMAX) 70 MG tablet Take 70 mg by mouth once a week. Take with a full glass of water  on an empty stomach.    [provider]  Cholecalciferol (VITAMIN D) 2000 units tablet Take 2,000 Units by mouth daily.    [provider]  escitalopram (LEXAPRO) 10 MG tablet Take 10 mg by mouth daily.    [provider]  HYDROcodone-acetaminophen (NORCO/VICODIN) 5-325 MG tablet I PO Q 3-4 HRS PRN PAIN Patient taking differently: Take 1 tablet by mouth every 4 (four) hours as needed for severe pain.  07/21/17   Garald Balding, MD  levothyroxine (SYNTHROID, LEVOTHROID) 100 MCG tablet Take 100 mcg by mouth daily before breakfast.     [provider]  vitamin B-12 (CYANOCOBALAMIN) 1000 MCG tablet Take 1,000 mcg  by mouth daily.    [provider]    Family History No family history on file.  Social History Social History   Tobacco Use  . Smoking status: Never Smoker  . Smokeless tobacco: Never Used  Substance Use Topics  . Alcohol use: No  . Drug use: No     Allergies   Penicillins and Prednisone   Review of Systems Review of Systems  Constitutional: Negative for chills and fever.  Respiratory: Positive for shortness of breath. Negative for cough.   Cardiovascular: Negative for chest pain.  Gastrointestinal: Negative for abdominal pain, nausea and vomiting.  Musculoskeletal: Positive for back pain.  Neurological: Positive for numbness. Negative for syncope and headaches.  All other systems reviewed and are negative.    Physical Exam Updated Vital Signs BP (!) 148/78 (BP Location: Left Arm)   Pulse 64   Temp 98.8 F (37.1 C) (Oral)   Resp 16   SpO2 94%   Physical Exam  Constitutional: She appears well-developed and well-nourished. No distress.  HENT:  Head: Normocephalic and atraumatic.  Eyes: Conjunctivae are normal. Right eye exhibits no discharge. Left eye exhibits no discharge.  Neck: No JVD present. No tracheal deviation present.  Cardiovascular: Normal rate and regular rhythm.  2+ radial pulses bilaterally, 2+ PT pulses bilaterally.  1+ DP pulses bilaterally.  No lower extremity edema.  Mild calf tenderness to the left lower extremity but no observable swelling.  No palpable cords, compartments are soft  Pulmonary/Chest: Effort normal and breath sounds normal. No stridor. No respiratory distress.  Abdominal: Soft. Bowel sounds are normal. She exhibits no distension.  Musculoskeletal: She exhibits no edema.  Well-healing 1cm incision overlying kyphoplasty site.  Patient has midline thoracic spine tenderness at around the levels of T9-T12 with bilateral parathoracic muscle tenderness.  She is significantly kyphotic.  5/5 strength of BLE major muscle groups    Neurological: She is alert. A sensory deficit is present.  Fluent speech with no evidence of dysarthria or aphasia, no facial droop, altered sensation to soft touch of the left lower extremity, worse in the lateral distribution.  Ambulates with good gait and balance, able to heel walk or toe walk at baseline.  Skin: Skin is warm and dry. No erythema.  Psychiatric: She has a normal mood and affect. Her behavior is normal.  Nursing note and vitals reviewed.    ED Treatments / Results  Labs (all labs ordered are listed, but only abnormal results are displayed) Labs Reviewed  CBC WITH DIFFERENTIAL/PLATELET - Abnormal; Notable for the following components:      Result Value   RBC 3.80 (*)    Hemoglobin 11.4 (*)    HCT 35.7 (*)    All other components within normal limits  D-DIMER, QUANTITATIVE (NOT AT [facility]) - Abnormal; Notable for  the following components:   D-Dimer, Quant 0.93 (*)    All other components within normal limits  BASIC METABOLIC PANEL  I-STAT TROPONIN, ED    EKG None  Radiology Dg Chest 2 View  Result Date: 08/01/2017 CLINICAL DATA:  Back pain and shortness of breath EXAM: CHEST - 2 VIEW COMPARISON:  08/29/2014 FINDINGS: Cardiac shadows within normal limits. The lungs are well aerated bilaterally. Changes of prior vertebral augmentation are noted at T11 stable from the prior kyphoplasty examination. No new compression deformity is seen. IMPRESSION: No acute abnormality noted. Electronically Signed   By: Inez Catalina M.D.   On: 08/01/2017 15:56   Ct Thoracic Spine Wo Contrast  Result Date: 08/01/2017 CLINICAL DATA:  Diffuse back pain. Compression fracture of T11 treated with vertebroplasty on 07/27/2017. EXAM: CT THORACIC AND LUMBAR SPINE WITHOUT CONTRAST TECHNIQUE: Multidetector CT imaging of the thoracic and lumbar spine was performed without contrast. Multiplanar CT image reconstructions were also generated. COMPARISON:  Chest x-ray dated 08/01/2017 and lumbar MRI  dated 07/11/2017 and radiographs dated 07/11/2017 FINDINGS: CT THORACIC SPINE FINDINGS Alignment: Normal. Vertebrae: Previously reported benign-appearing compression fracture of the superior endplate of N98. The patient has undergone vertebroplasty. Minimal protrusion of the posterosuperior aspect of T11 into the spinal canal without neural impingement. No other significant abnormality of the thoracic spine. Small Schmorl's node in the inferior endplate of X21 asymmetric to the right. Paraspinal and other soft tissues: Negative. Disc levels: There is no disc protrusion or significant disc bulging or spinal or foraminal stenosis in the thoracic spine. Minimal left facet arthritis at C7-T1. CT LUMBAR SPINE FINDINGS Segmentation: 5 lumbar type vertebrae. Alignment: Chronic lumbar scoliosis with convexity to the right centered at L2. 3 mm retrolisthesis of L2 on L3. Vertebrae: No acute abnormalities. Paraspinal and other soft tissues: No acute abnormality. Aortic atherosclerosis. Disc levels: L1-2: Minimal broad-based disc bulge asymmetric into the left neural foramen without neural impingement. L2-3: Chronic severe disc space narrowing with slight retrolisthesis. Broad-based inferior endplate osteophytes slightly compresses the thecal sac without focal neural impingement. L3-4: Small broad-based disc bulge without neural impingement. Moderate right and minimal left facet arthritis. L4-5: Chronic disc space narrowing with a vacuum phenomenon. Small broad-based endplate osteophytes asymmetric to the right. Slight hypertrophy of the ligamentum flavum and facet joints combine to slightly narrow the spinal canal and right lateral recess without focal neural impingement. Moderate bilateral facet arthritis. L5-S1: Severe right and moderate left facet arthritis. Small broad-based disc bulge without neural impingement. IMPRESSION: CT THORACIC SPINE IMPRESSION 1. Benign-appearing compression fracture of the superior endplate of  J94 treated with vertebroplasty. 2. No new abnormalities.  No neural impingement. CT LUMBAR SPINE IMPRESSION Chronic multilevel degenerative disc and joint disease without focal neural impingement and without change since the prior MRI of 07/11/2017. Electronically Signed   By: Lorriane Shire M.D.   On: 08/01/2017 16:36   Ct Lumbar Spine Wo Contrast  Result Date: 08/01/2017 CLINICAL DATA:  Diffuse back pain. Compression fracture of T11 treated with vertebroplasty on 07/27/2017. EXAM: CT THORACIC AND LUMBAR SPINE WITHOUT CONTRAST TECHNIQUE: Multidetector CT imaging of the thoracic and lumbar spine was performed without contrast. Multiplanar CT image reconstructions were also generated. COMPARISON:  Chest x-ray dated 08/01/2017 and lumbar MRI dated 07/11/2017 and radiographs dated 07/11/2017 FINDINGS: CT THORACIC SPINE FINDINGS Alignment: Normal. Vertebrae: Previously reported benign-appearing compression fracture of the superior endplate of R74. The patient has undergone vertebroplasty. Minimal protrusion of the posterosuperior aspect of T11 into the spinal canal without  neural impingement. No other significant abnormality of the thoracic spine. Small Schmorl's node in the inferior endplate of U54 asymmetric to the right. Paraspinal and other soft tissues: Negative. Disc levels: There is no disc protrusion or significant disc bulging or spinal or foraminal stenosis in the thoracic spine. Minimal left facet arthritis at C7-T1. CT LUMBAR SPINE FINDINGS Segmentation: 5 lumbar type vertebrae. Alignment: Chronic lumbar scoliosis with convexity to the right centered at L2. 3 mm retrolisthesis of L2 on L3. Vertebrae: No acute abnormalities. Paraspinal and other soft tissues: No acute abnormality. Aortic atherosclerosis. Disc levels: L1-2: Minimal broad-based disc bulge asymmetric into the left neural foramen without neural impingement. L2-3: Chronic severe disc space narrowing with slight retrolisthesis. Broad-based  inferior endplate osteophytes slightly compresses the thecal sac without focal neural impingement. L3-4: Small broad-based disc bulge without neural impingement. Moderate right and minimal left facet arthritis. L4-5: Chronic disc space narrowing with a vacuum phenomenon. Small broad-based endplate osteophytes asymmetric to the right. Slight hypertrophy of the ligamentum flavum and facet joints combine to slightly narrow the spinal canal and right lateral recess without focal neural impingement. Moderate bilateral facet arthritis. L5-S1: Severe right and moderate left facet arthritis. Small broad-based disc bulge without neural impingement. IMPRESSION: CT THORACIC SPINE IMPRESSION 1. Benign-appearing compression fracture of the superior endplate of Y70 treated with vertebroplasty. 2. No new abnormalities.  No neural impingement. CT LUMBAR SPINE IMPRESSION Chronic multilevel degenerative disc and joint disease without focal neural impingement and without change since the prior MRI of 07/11/2017. Electronically Signed   By: Lorriane Shire M.D.   On: 08/01/2017 16:36    Procedures Procedures (including critical care time)  Medications Ordered in ED Medications  morphine 4 MG/ML injection 2 mg (has no administration in time range)     Initial Impression / Assessment and Plan / ED Course  I have reviewed the triage vital signs and the nursing notes.  Pertinent labs & imaging results that were available during my care of the patient were reviewed by me and considered in my medical decision making (see chart for details).     Patient presents with complaint of left-sided thoracic back pain and left lower extremity paresthesias after T11 kyphoplasty 6 days ago.  She also is complaining of worsening shortness of breath since the procedure.  She is afebrile, vital signs are stable.  She is nontoxic in appearance.  She exhibits no increased work of breathing.  She is neurovascularly intact and ambulatory  without difficulty despite pain.  No red flag signs concerning for cauda equina or spinal abscess.  She denies lightheadedness, no concern for acute intracranial abnormality.  Given recent procedure, will obtain imaging of the thoracic and lumbar spine to assess for acute injury or occult fracture.  Lab work reviewed by me shows no leukocytosis, no metabolic derangements.  Stable anemia.  Chest x-ray shows no acute cardiopulmonary abnormalities.  Troponin is negative and I doubt ACS/MI in the absence of chest pain.   D-dimer is elevated even given age adjustment.  Given recent procedure and worsening shortness of breath, will obtain CTA chest to evaluate for PE.  Awaiting EKG.  5:04 PM Signed out to oncoming provider Dr. Hazle Nordmann.  Awaiting CTA chest to rule out PE.  Patient is resting comfortably and states she has no pain presently after administration of morphine.  If CTA chest negative for acute PE or other acute cardiopulmonary abnormality, patient stable for discharge home with additional pain control.  She will follow-up with her  orthopedist and PCP for reevaluation.  May require admission if imaging is concerning for acute PE.  Patient seen and evaluated by Dr. Ellender Hose who agrees with assessment and plan at this time.  Final Clinical Impressions(s) / ED Diagnoses   Final diagnoses:  Acute left-sided thoracic back pain  Radicular pain of left lower extremity    ED Discharge Orders    None       Debroah Baller 08/01/17 1705    Duffy Bruce, MD 08/04/17 2236

## 2017-08-01 NOTE — ED Provider Notes (Signed)
Patient care assumed from [facility], [place], at about 1645. Patient presenting for back pain and near syncope after recent kyphoplasty. Spine imaging stable. Dimer elevated. Plan is to f/u CTA chest, d/c with pain meds and Medrol dosepak if negative.  1945: CTA chest shows no PE or other acute abnormality.  Patient reassessed at bedside.  Symptoms improved after previously given morphine and Decadron.  HPI briefly reviewed.  Patient resting comfortably, smiling, pleasant.  Will discharge home with PCP follow-up.  Patient and daughter informed of all ED findings. Return precautions and follow up plan reviewed.  She has a son who stays with her.  Advised caution regarding mobility and falls while taking oxycodone. All questions answered.    Prescilla Sours, MD 08/01/17 [place], [place], MD 08/03/17 [phone]

## 2017-08-02 NOTE — Progress Notes (Signed)
No note

## 2017-08-03 ENCOUNTER — Ambulatory Visit (HOSPITAL_COMMUNITY)
Admission: RE | Admit: 2017-08-03 | Discharge: 2017-08-03 | Disposition: A | Discharge status: Home / Self Care | Payer: Medicare Other | Source: Ambulatory Visit | Attending: Interventional Radiology | Admitting: Interventional Radiology

## 2017-09-14 ENCOUNTER — Encounter: Payer: Self-pay | Admitting: Primary Care

## 2017-09-14 ENCOUNTER — Ambulatory Visit (INDEPENDENT_AMBULATORY_CARE_PROVIDER_SITE_OTHER): Payer: Medicare Other | Admitting: Primary Care

## 2017-09-14 VITALS — BP 120/70 | HR 65 | Temp 98.3°F | Ht 63.0 in | Wt 107.8 lb

## 2017-09-14 DIAGNOSIS — F329 Major depressive disorder, single episode, unspecified: Secondary | ICD-10-CM

## 2017-09-14 DIAGNOSIS — R42 Dizziness and giddiness: Secondary | ICD-10-CM

## 2017-09-14 DIAGNOSIS — E039 Hypothyroidism, unspecified: Secondary | ICD-10-CM

## 2017-09-14 DIAGNOSIS — F419 Anxiety disorder, unspecified: Secondary | ICD-10-CM

## 2017-09-14 DIAGNOSIS — Z23 Encounter for immunization: Secondary | ICD-10-CM | POA: Diagnosis not present

## 2017-09-14 DIAGNOSIS — J439 Emphysema, unspecified: Secondary | ICD-10-CM | POA: Diagnosis not present

## 2017-09-14 DIAGNOSIS — F32A Depression, unspecified: Secondary | ICD-10-CM | POA: Insufficient documentation

## 2017-09-14 DIAGNOSIS — E2839 Other primary ovarian failure: Secondary | ICD-10-CM | POA: Diagnosis not present

## 2017-09-14 DIAGNOSIS — S22000A Wedge compression fracture of unspecified thoracic vertebra, initial encounter for closed fracture: Secondary | ICD-10-CM | POA: Diagnosis not present

## 2017-09-14 DIAGNOSIS — Z1231 Encounter for screening mammogram for malignant neoplasm of breast: Secondary | ICD-10-CM

## 2017-09-14 DIAGNOSIS — Z1239 Encounter for other screening for malignant neoplasm of breast: Secondary | ICD-10-CM

## 2017-09-14 LAB — CBC
HCT: 35.6 % — ABNORMAL LOW (ref 36.0–46.0)
HEMOGLOBIN: 11.9 g/dL — AB (ref 12.0–15.0)
MCHC: 33.6 g/dL (ref 30.0–36.0)
MCV: 92.3 fl (ref 78.0–100.0)
PLATELETS: 227 10*3/uL (ref 150.0–400.0)
RBC: 3.85 Mil/uL — ABNORMAL LOW (ref 3.87–5.11)
RDW: 15.8 % — AB (ref 11.5–15.5)
WBC: 4.3 10*3/uL (ref 4.0–10.5)

## 2017-09-14 LAB — COMPREHENSIVE METABOLIC PANEL
ALBUMIN: 4.2 g/dL (ref 3.5–5.2)
ALK PHOS: 44 U/L (ref 39–117)
ALT: 6 U/L (ref 0–35)
AST: 13 U/L (ref 0–37)
BUN: 8 mg/dL (ref 6–23)
CALCIUM: 9.7 mg/dL (ref 8.4–10.5)
CHLORIDE: 99 meq/L (ref 96–112)
CO2: 31 mEq/L (ref 19–32)
Creatinine, Ser: 0.73 mg/dL (ref 0.40–1.20)
GFR: 80.13 mL/min (ref >60.00)
Glucose, Bld: 97 mg/dL (ref 70–99)
POTASSIUM: 4.5 meq/L (ref 3.5–5.1)
Sodium: 136 mEq/L (ref 135–145)
TOTAL PROTEIN: 7.1 g/dL (ref 6.0–8.3)
Total Bilirubin: 0.5 mg/dL (ref 0.2–1.2)

## 2017-09-14 LAB — TSH: TSH: 3.79 u[IU]/mL (ref 0.35–4.50)

## 2017-09-14 NOTE — Patient Instructions (Addendum)
Call the [facility] to schedule your mammogram and bone density scan.  Stop by the lab prior to leaving today. I will notify you of your results once received.   Please schedule a medicare wellness visit with our nurse in 6 months.   It was a pleasure to meet you today! Please don't hesitate to call or message me with any questions. Welcome to [facility]!

## 2017-09-14 NOTE — Assessment & Plan Note (Signed)
Managed on Lexapro 10 mg daily, feels well managed. Continue same.

## 2017-09-14 NOTE — Assessment & Plan Note (Signed)
History of for years, intermittent. Overall doing better without recent symptoms.

## 2017-09-14 NOTE — Assessment & Plan Note (Signed)
No recent TSH on file, patient is unsure when this was last checked. TSH pending today. Taking levothyroxine appropriately. Continue 100 mcg for now.

## 2017-09-14 NOTE — Assessment & Plan Note (Signed)
Kyphoplasty in July 2019, overall seems to be doing well. Sounds like she has some residual effects to left lower extremity from procedure, she was told this was the case.  Continue alendronate 70 mg weekly. Bone density scan pending.

## 2017-09-14 NOTE — Addendum Note (Signed)
Addended by: Jacqualin Combes on: 09/14/2017 02:16 PM   Modules accepted: Orders

## 2017-09-14 NOTE — Progress Notes (Signed)
Subjective:    Patient ID: [name], female    DOB: 07/02/1928, 84 y.o.   MRN: [id number]  HPI  [name] is an 84 year old female who presents today to establish care and discuss the problems mentioned below. Will obtain old records. She is due for her mammogram. History of ovarian cancer and cancerous mass to the bladder.   BP Readings from Last 3 Encounters:  09/14/17 120/70  08/01/17 (!) 172/69  07/27/17 127/62     1) Compression Fracture: Currently managed on alendronate 70 mg weekly. Last fracture noted to T11 in July 2019. Underwent IR kypho thoracic with bone biopsy. Overall doing well since the procedure, but has noticed left lower extremity tingling and "feels hot" on the inside. She does have intermittent pain to her left lower extremity. She's taking Tylenol daily for arthritis.   2) Hypothyroidism: Currently managed on levothyroxine 100 mcg. She takes her levothyroxine every morning on an empty stomach with water only. She's waiting 30 minutes prior to eating and taking other medications. Last TSH on file was in March 2017 with level of 0.541.  3) Anxiety and Depression: Currently managed on Lexapro 10 mg for which she's taken for years. Overall she feels well managed and is compliant daily.   Review of Systems  Respiratory: Negative for shortness of breath.   Cardiovascular: Negative for chest pain.  Musculoskeletal: Negative for back pain.       Left lower extremity pain and tingling, intermittent.  Neurological: Negative for dizziness.  Psychiatric/Behavioral:       Feels well managed on Lexapro       Past Medical History:  Diagnosis Date  . Anxiety   . Arthritis    "hands" (10/24/2012)  . B12 deficiency    "get shots twice/month" (10/24/2012)  . Bursitis   . Depression   . Diverticulitis   . Emphysema dx 2012  . Exertional shortness of breath   . Headache(784.0)    "weekly at least" (10/24/2012)  . Hypothyroidism   . Insomnia   . Ovarian  cancer ([place])    mass hooked to sm. intestine/bladder  . Skin cancer of face   . Syncope    when neck is up or turned to side  . Syncope and collapse    "first time I ever I collapsed"; confirms h/o presyncopal events (10/24/2012)  . UTI (lower urinary tract infection)      Social History   Socioeconomic History  . Marital status: Widowed    Spouse name: Not on file  . Number of children: Not on file  . Years of education: Not on file  . Highest education level: Not on file  Occupational History  . Not on file  Social Needs  . Financial resource strain: Not on file  . Food insecurity:    Worry: Not on file    Inability: Not on file  . Transportation needs:    Medical: Not on file    Non-medical: Not on file  Tobacco Use  . Smoking status: Never Smoker  . Smokeless tobacco: Never Used  Substance and Sexual Activity  . Alcohol use: No  . Drug use: No  . Sexual activity: Never  Lifestyle  . Physical activity:    Days per week: Not on file    Minutes per session: Not on file  . Stress: Not on file  Relationships  . Social connections:    Talks on phone: Not on file    Gets  together: Not on file    Attends religious service: Not on file    Active member of club or organization: Not on file    Attends meetings of clubs or organizations: Not on file    Relationship status: Not on file  . Intimate partner violence:    Fear of current or ex partner: Not on file    Emotionally abused: Not on file    Physically abused: Not on file    Forced sexual activity: Not on file  Other Topics Concern  . Not on file  Social History Narrative  . Not on file    Past Surgical History:  Procedure Laterality Date  . ABDOMINAL HYSTERECTOMY    . CATARACT EXTRACTION W/ INTRAOCULAR LENS  IMPLANT, BILATERAL Bilateral 2000's  . DILATION AND CURETTAGE OF UTERUS     several  . FOOT SURGERY Right 1943-1944   4 surgeries  . IR KYPHO THORACIC WITH BONE BIOPSY  07/27/2017  . THYROIDECTOMY     . TONSILLECTOMY      History reviewed. No pertinent family history.  Allergies  Allergen Reactions  . Penicillins Hives and Rash    Has patient had a PCN reaction causing immediate rash, facial/tongue/throat swelling, SOB or lightheadedness with hypotension: Yes Has patient had a PCN reaction causing severe rash involving mucus membranes or skin necrosis: Yes Has patient had a PCN reaction that required hospitalization No Has patient had a PCN reaction occurring within the last 10 years: No If all of the above answers are "NO", then may proceed with Cephalosporin use.   . Prednisone Swelling    Current Outpatient Medications on File Prior to Visit  Medication Sig Dispense Refill  . acetaminophen (TYLENOL) 650 MG CR tablet Take 650 mg by mouth every 8 (eight) hours as needed for pain.     Marland Kitchen alendronate (FOSAMAX) 70 MG tablet Take 70 mg by mouth once a week. Take with a full glass of water on an empty stomach. Mondays    . Cholecalciferol (VITAMIN D) 2000 units tablet Take 2,000 Units by mouth daily.    Marland Kitchen escitalopram (LEXAPRO) 10 MG tablet Take 10 mg by mouth daily.    Marland Kitchen levothyroxine (SYNTHROID, LEVOTHROID) 100 MCG tablet Take 100 mcg by mouth daily before breakfast.     . vitamin B-12 (CYANOCOBALAMIN) 1000 MCG tablet Take 1,000 mcg by mouth daily.     No current facility-administered medications on file prior to visit.     BP 120/70   Pulse 65   Temp 98.3 F (36.8 C) (Oral)   Ht 5\' 3"  (1.6 m)   Wt 107 lb 12 oz (48.9 kg)   SpO2 98%   BMI 19.09 kg/m    Objective:   Physical Exam  Constitutional: She is oriented to person, place, and time. She appears well-nourished.  Cardiovascular: Normal rate and regular rhythm.  Respiratory: Effort normal and breath sounds normal.  Neurological: She is alert and oriented to person, place, and time.  Skin: Skin is warm and dry.  Psychiatric: She has a normal mood and affect.           Assessment & Plan:

## 2017-09-14 NOTE — Assessment & Plan Note (Signed)
Never smoker, but exposed to second hand smoke from her husband. Intermittent shortness of breath with exertion, overall not bothersome. No inhaler use.

## 2017-09-27 ENCOUNTER — Encounter: Payer: Self-pay | Admitting: Primary Care

## 2017-09-27 ENCOUNTER — Ambulatory Visit (INDEPENDENT_AMBULATORY_CARE_PROVIDER_SITE_OTHER): Payer: Medicare Other | Admitting: Family Medicine

## 2017-09-27 ENCOUNTER — Telehealth: Payer: Self-pay | Admitting: Family Medicine

## 2017-09-27 ENCOUNTER — Encounter: Payer: Self-pay | Admitting: Family Medicine

## 2017-09-27 DIAGNOSIS — Z1231 Encounter for screening mammogram for malignant neoplasm of breast: Secondary | ICD-10-CM | POA: Diagnosis not present

## 2017-09-27 DIAGNOSIS — Z803 Family history of malignant neoplasm of breast: Secondary | ICD-10-CM | POA: Diagnosis not present

## 2017-09-27 DIAGNOSIS — H61899 Other specified disorders of external ear, unspecified ear: Secondary | ICD-10-CM | POA: Diagnosis not present

## 2017-09-27 DIAGNOSIS — M810 Age-related osteoporosis without current pathological fracture: Secondary | ICD-10-CM | POA: Diagnosis not present

## 2017-09-27 MED ORDER — HYDROCORTISONE 1 % EX GEL: CUTANEOUS | 0 refills | Status: DC

## 2017-09-27 NOTE — Telephone Encounter (Signed)
Please call pharmacy. Yes, assuming she can get 3 drops in her ear.  No change in sig. Thanks.

## 2017-09-27 NOTE — Telephone Encounter (Signed)
Copied from [place] [phone]. Topic: Quick Communication - Rx Refill/Question >> Sep 27, 2017  1:27 PM Sheran Luz wrote: Medication: HYDROCORTISONE, TOPICAL, 1 % GEL   Stephanie from [facility] called stating that there is no gel available at this time and would like to know if the lotion would be a suitable replacement. Please advise.   [place], [place] - [place]  [phone] (Phone) [phone] (Fax)

## 2017-09-27 NOTE — Patient Instructions (Signed)
Canal irritation.  Use hydrocortisone gel twice a day as needed.  Update me as needed.   Take care.  Glad to see you.

## 2017-09-27 NOTE — Progress Notes (Signed)
R ear pain.  Some pressure.  L ear is fine. She thought she nearly had a fever yesterday, <100 yesterday when checked.  Rhinorrhea at baseline.  No ST.  No vomiting.  Some occ cough.  No facial pain. She doesn't wear hearing aids.    She had h/o falls but not recently.  Using walker at baseline.  Lives with her son. Family is family helping out.   Cautions d/w pt re: falls.  She declined fall button.    She has some occ B ankle edema with h/o R ankle surgery noted.    Meds, vitals, and allergies reviewed.   ROS: Per HPI unless specifically indicated in ROS section   nad ncat Bilateral pinna and TM exam within normal limits.  Left canal within normal limits.  Right canal with isolated spot of superficial irritation that does not appear infected.  No purulent discharge. Sinuses not tender.  Nasal exam and oropharyngeal exam unremarkable. Neck supple, no lymphadenopathy RRR CTAB

## 2017-09-27 NOTE — Telephone Encounter (Signed)
Called and spoke to [place] her Dr. Josefine Class note. Understanding verbalized nothing further needed at this time.

## 2017-09-28 DIAGNOSIS — H61899 Other specified disorders of external ear, unspecified ear: Secondary | ICD-10-CM | POA: Insufficient documentation

## 2017-09-28 NOTE — Assessment & Plan Note (Signed)
Does not appear infected.  Advised not to use Q-tips.  Can use topical hydrocortisone and follow-up as needed.  Routine cautions given.  She agrees.  See above.

## 2017-10-12 ENCOUNTER — Other Ambulatory Visit: Payer: Self-pay | Admitting: *Deleted

## 2017-10-12 DIAGNOSIS — F419 Anxiety disorder, unspecified: Secondary | ICD-10-CM

## 2017-10-12 DIAGNOSIS — F329 Major depressive disorder, single episode, unspecified: Secondary | ICD-10-CM

## 2017-10-12 DIAGNOSIS — E039 Hypothyroidism, unspecified: Secondary | ICD-10-CM

## 2017-10-12 MED ORDER — ESCITALOPRAM OXALATE 10 MG PO TABS: ORAL_TABLET | ORAL | 3 refills | Status: DC

## 2017-10-12 MED ORDER — LEVOTHYROXINE SODIUM 100 MCG PO TABS: ORAL_TABLET | ORAL | 3 refills | Status: DC

## 2017-10-12 NOTE — Telephone Encounter (Signed)
Noted, refills sent to pharmacy. 

## 2017-10-12 NOTE — Telephone Encounter (Signed)
Medications have not been prescribed  by Allie Bossier.  Last seen on 09/14/2017

## 2017-10-12 NOTE — Telephone Encounter (Signed)
Copied from [place] [id number]. Topic: Quick Communication - Rx Refill/Question >> Oct 10, 2017 11:23 AM Carolyn Stare wrote: Medication      escitalopram (LEXAPRO) 10 MG tablet    levothyroxine (SYNTHROID, LEVOTHROID) 100 MCG tablet     Preferred Pharmacy [facility]   Agent: Please be advised that RX refills may take up to 3 business days. We ask that you follow-up with your pharmacy.

## 2017-10-31 ENCOUNTER — Ambulatory Visit: Payer: Self-pay | Admitting: *Deleted

## 2017-10-31 NOTE — Telephone Encounter (Signed)
Pt's daughter Sunday Spillers called because the pt has been having shortness of breath for a couple of months; she also says that for the past 2 weeks it has worsened especially when she talks on the phone or walks anywhere; recommendations made per nurse triage protocol; Sunday Spillers would like for her mother to be seen in the office instead of going to the ED; also explained that even if an appointment is scheduled, the office may still recommend that the pt be sent to the ED; unable to reach flow coordinator at [facility]; the pt normally sees Alma Friendly but she has no availability; Sunday Spillers is offered and accepted appointment for the pt with Webb Silversmith, [place], 11/01/17 at 0900; she verbalizes understanding; the pt and her daughter can be contacted at [phone]; will route to office for notification.    Reason for Disposition . [1] MODERATE difficulty breathing (e.g., speaks in phrases, SOB even at rest, pulse 100-120) AND [2] NEW-onset or WORSE than normal  Answer Assessment - Initial Assessment Questions 1. RESPIRATORY STATUS: "Describe your breathing?" (e.g., wheezing, shortness of breath, unable to speak, severe coughing)      Short of breath 2. ONSET: "When did this breathing problem begin?"      Began 2 months ago 3. PATTERN "Does the difficult breathing come and go, or has it been constant since it started?"      Constant and worsening over the [ast 2 weeks 4. SEVERITY: "How bad is your breathing?" (e.g., mild, moderate, severe)    - MILD: No SOB at rest, mild SOB with walking, speaks normally in sentences, can lay down, no retractions, pulse < 100.    - MODERATE: SOB at rest, SOB with minimal exertion and prefers to sit, cannot lie down flat, speaks in phrases, mild retractions, audible wheezing, pulse 100-120.    - SEVERE: Very SOB at rest, speaks in single words, struggling to breathe, sitting hunched forward, retractions, pulse > 120      Moderate to severe 5. RECURRENT SYMPTOM:  "Have you had difficulty breathing before?" If so, ask: "When was the last time?" and "What happened that time?"      no 6. CARDIAC HISTORY: "Do you have any history of heart disease?" (e.g., heart attack, angina, bypass surgery, angioplasty)      no 7. LUNG HISTORY: "Do you have any history of lung disease?"  (e.g., pulmonary embolus, asthma, emphysema)     emphysema 8. CAUSE: "What do you think is causing the breathing problem?"      worsening emphysema 9. OTHER SYMPTOMS: "Do you have any other symptoms? (e.g., dizziness, runny nose, cough, chest pain, fever)     Tired, sluggish 10. PREGNANCY: "Is there any chance you are pregnant?" "When was your last menstrual period?"       no 11. TRAVEL: "Have you traveled out of the country in the last month?" (e.g., travel history, exposures)       no  Protocols used: BREATHING DIFFICULTY-A-AH

## 2017-10-31 NOTE — Telephone Encounter (Signed)
I spoke with [name] pts daughter (DPR signed) pt does not want to go to ED; sylvia said sitting still pt does not have any SOB or problems, pt does feel weak and tired pretty much all the time; pt has not had any CP, dizziness, cough or wheezing. Pt's son is staying with pt; [name] understands that if pt condition were to worsen prior to appt pt is to go to ED. [name] voiced understanding if SOB worsens or pt starts getting SOB just sitting, CP or lightheadedness; pt will go to Ed prior to appt. [name] also understands if pt waits to be seen on 11/01/17 at 9 AM and Avie Echevaria NP determines pt should go to ED [name] said pt will go. Pt wants eval at [facility] first because pt does not feel like she is able to sit in ED for long period of time.[name] is going to see her mom shortly and will see how she is doing and advise pt if breathing seems any worse that she needs to go to ED today. FYI to Avie Echevaria NP.

## 2017-11-01 ENCOUNTER — Ambulatory Visit (INDEPENDENT_AMBULATORY_CARE_PROVIDER_SITE_OTHER)
Admission: RE | Admit: 2017-11-01 | Discharge: 2017-11-01 | Disposition: A | Discharge status: Home / Self Care | Payer: Medicare Other | Source: Ambulatory Visit | Attending: Internal Medicine | Admitting: Internal Medicine

## 2017-11-01 ENCOUNTER — Telehealth: Payer: Self-pay | Admitting: Primary Care

## 2017-11-01 ENCOUNTER — Encounter: Payer: Self-pay | Admitting: Internal Medicine

## 2017-11-01 ENCOUNTER — Ambulatory Visit (INDEPENDENT_AMBULATORY_CARE_PROVIDER_SITE_OTHER): Payer: Medicare Other | Admitting: Internal Medicine

## 2017-11-01 VITALS — BP 124/70 | HR 66 | Temp 97.7°F | Wt 107.0 lb

## 2017-11-01 DIAGNOSIS — R0602 Shortness of breath: Secondary | ICD-10-CM

## 2017-11-01 DIAGNOSIS — R0781 Pleurodynia: Secondary | ICD-10-CM

## 2017-11-01 DIAGNOSIS — R0609 Other forms of dyspnea: Secondary | ICD-10-CM | POA: Diagnosis not present

## 2017-11-01 DIAGNOSIS — R0600 Dyspnea, unspecified: Secondary | ICD-10-CM

## 2017-11-01 MED ORDER — ALBUTEROL SULFATE (2.5 MG/3ML) 0.083% IN NEBU
2.5000 mg | INHALATION_SOLUTION | Freq: Once | RESPIRATORY_TRACT | Status: AC
  Administered 2017-11-01: 2.5 mg via RESPIRATORY_TRACT

## 2017-11-01 MED ORDER — ALBUTEROL SULFATE HFA 108 (90 BASE) MCG/ACT IN AERS: 2.0000 | INHALATION_SPRAY | Freq: Four times a day (QID) | RESPIRATORY_TRACT | 0 refills | Status: DC | PRN

## 2017-11-01 NOTE — Telephone Encounter (Signed)
Copied from [place] [id number]. Topic: Quick Communication - Other Results (Clinic Use ONLY) >> Nov 01, 2017  5:36 PM Cecelia Byars, NT wrote: Patients daughter called and would like a call back regarding x rays results from 11/01/17 at [phone]

## 2017-11-01 NOTE — Patient Instructions (Signed)
Shortness of Breath, Adult  Shortness of breath means you have trouble breathing. Your lungs are organs for breathing.  Follow these instructions at home:  Pay attention to any changes in your symptoms. Take these actions to help with your condition:  ? Do not smoke. Smoking can cause shortness of breath. If you need help to quit smoking, ask your doctor.  ? Avoid things that can make it harder to breathe, such as:  ? Mold.  ? Dust.  ? Air pollution.  ? Chemical smells.  ? Things that can cause allergy symptoms (allergens), if you have allergies.  ? Keep your living space clean and free of mold and dust.  ? Rest as needed. Slowly return to your usual activities.  ? Take over-the-counter and prescription medicines, including oxygen and inhaled medicines, only as told by your doctor.  ? Keep all follow-up visits as told by your doctor. This is important.  Contact a doctor if:  ? Your condition does not get better as soon as expected.  ? You have a hard time doing your normal activities, even after you rest.  ? You have new symptoms.  Get help right away if:  ? You have trouble breathing when you are resting.  ? You feel light-headed or you faint.  ? You have a cough that is not helped by medicines.  ? You cough up blood.  ? You have pain with breathing.  ? You have pain in your chest, arms, shoulders, or belly (abdomen).  ? You have a fever.  ? You cannot walk up stairs.  ? You cannot exercise the way you normally do.  This information is not intended to replace advice given to you by your health care provider. Make sure you discuss any questions you have with your health care provider.  Document Released: 06/16/2007 Document Revised: 01/15/2016 Document Reviewed: 01/15/2016  Elsevier Interactive Patient Education ? 2017 Elsevier Inc[employer]

## 2017-11-01 NOTE — Addendum Note (Signed)
Addended by: Lurlean Nanny on: 11/01/2017 04:51 PM   Modules accepted: Orders

## 2017-11-01 NOTE — Progress Notes (Signed)
Subjective:    Patient ID: [name], female    DOB: 1928-04-23, 84 y.o.   MRN: [id number]  HPI  Pt presents to the clinic today with c/o of shortness of breath. She reports this started 2 months ago, but is worse in the last 2 weeks. She is short of breath at rest when talking and has some dyspnea on exertion. She denies runny nose, nasal congestion, cough or chest congestion. She denies chest pain, chest tightness or palpitations. She is c/o some rib pain on the left side, but denies recent fall. She has not noticed any rash of that area. She does have a history of anxiety and emphysema. She had a CTA chest 07/2017 which was negative for PE. Echo from 11/2015 showed grade 1 diastolic dysfunction. She has not taken anything OTC for this.   Review of Systems      Past Medical History:  Diagnosis Date  . Anxiety   . Arthritis    "hands" (10/24/2012)  . B12 deficiency    "get shots twice/month" (10/24/2012)  . Bursitis   . Depression   . Diverticulitis   . Emphysema dx 2012  . Exertional shortness of breath   . Headache(784.0)    "weekly at least" (10/24/2012)  . Hypothyroidism   . Insomnia   . Ovarian cancer ([place])    mass hooked to sm. intestine/bladder  . Skin cancer of face   . Syncope    when neck is up or turned to side  . Syncope and collapse    "first time I ever I collapsed"; confirms h/o presyncopal events (10/24/2012)  . UTI (lower urinary tract infection)     Current Outpatient Medications  Medication Sig Dispense Refill  . acetaminophen (TYLENOL) 650 MG CR tablet Take 650 mg by mouth every 8 (eight) hours as needed for pain.     Marland Kitchen alendronate (FOSAMAX) 70 MG tablet Take 70 mg by mouth once a week. Take with a full glass of water on an empty stomach. Mondays    . Cholecalciferol (VITAMIN D) 2000 units tablet Take 2,000 Units by mouth daily.    Marland Kitchen escitalopram (LEXAPRO) 10 MG tablet Take 1 tablet by mouth once daily for anxiety/depression. 90 tablet 3  .  HYDROCORTISONE, TOPICAL, 1 % GEL 3 drops in right ear twice a day as needed for pain. 28 g 0  . levothyroxine (SYNTHROID, LEVOTHROID) 100 MCG tablet Take 1 tablet by mouth every morning on an empty stomach with water. No food or other medications for 30 minutes. 90 tablet 3  . vitamin B-12 (CYANOCOBALAMIN) 1000 MCG tablet Take 1,000 mcg by mouth daily.     No current facility-administered medications for this visit.     Allergies  Allergen Reactions  . Penicillins Hives and Rash    Has patient had a PCN reaction causing immediate rash, facial/tongue/throat swelling, SOB or lightheadedness with hypotension: Yes Has patient had a PCN reaction causing severe rash involving mucus membranes or skin necrosis: Yes Has patient had a PCN reaction that required hospitalization No Has patient had a PCN reaction occurring within the last 10 years: No If all of the above answers are "NO", then may proceed with Cephalosporin use.   . Prednisone Swelling    History reviewed. No pertinent family history.  Social History   Socioeconomic History  . Marital status: Widowed    Spouse name: Not on file  . Number of children: Not on file  . Years of education: Not  on file  . Highest education level: Not on file  Occupational History  . Not on file  Social Needs  . Financial resource strain: Not on file  . Food insecurity:    Worry: Not on file    Inability: Not on file  . Transportation needs:    Medical: Not on file    Non-medical: Not on file  Tobacco Use  . Smoking status: Never Smoker  . Smokeless tobacco: Never Used  Substance and Sexual Activity  . Alcohol use: No  . Drug use: No  . Sexual activity: Never  Lifestyle  . Physical activity:    Days per week: Not on file    Minutes per session: Not on file  . Stress: Not on file  Relationships  . Social connections:    Talks on phone: Not on file    Gets together: Not on file    Attends religious service: Not on file    Active  member of club or organization: Not on file    Attends meetings of clubs or organizations: Not on file    Relationship status: Not on file  . Intimate partner violence:    Fear of current or ex partner: Not on file    Emotionally abused: Not on file    Physically abused: Not on file    Forced sexual activity: Not on file  Other Topics Concern  . Not on file  Social History Narrative  . Not on file     Constitutional: Denies fever, malaise, fatigue, headache or abrupt weight changes.  HEENT: Denies eye pain, eye redness, ear pain, ringing in the ears, wax buildup, runny nose, nasal congestion, bloody nose, or sore throat. Respiratory: Pt reports shortness of breath, dyspnea on exertion. Denies difficulty breathing, cough or sputum production.   Cardiovascular: Denies chest pain, chest tightness, palpitations or swelling in the hands or feet.  MSK: Pt reports left side rib pain.  No other specific complaints in a complete review of systems (except as listed in HPI above).  Objective:   Physical Exam   BP 124/70   Pulse 66   Temp 97.7 F (36.5 C) (Oral)   Wt 107 lb (48.5 kg)   SpO2 98%   BMI 18.95 kg/m  Wt Readings from Last 3 Encounters:  11/01/17 107 lb (48.5 kg)  09/27/17 106 lb (48.1 kg)  09/14/17 107 lb 12 oz (48.9 kg)    General: Appears her stated age, in NAD. Skin: No rash or bruising noted of left side ribs. HEENT: HThroat/Mouth: Teeth present, mucosa pink and moist, no exudate, lesions or ulcerations noted.  Neck:  Neck supple, trachea midline. No masses, lumps or thyromegaly present.  Cardiovascular: Normal rate and rhythm. S1,S2 noted.  No murmur, rubs or gallops noted. No JVD or BLE edema.  Pulmonary/Chest: Slightly increased effort and positive vesicular breath sounds. No respiratory distress. No wheezes, rales or ronchi noted.  MSK: Left posterior/lateral ribs tender with palpation. Gait slow and steady with use of rolling walker. Neurological: Alert and  oriented.    BMET    Component Value Date/Time   NA 136 09/14/2017 1119   K 4.5 09/14/2017 1119   CL 99 09/14/2017 1119   CO2 31 09/14/2017 1119   GLUCOSE 97 09/14/2017 1119   BUN 8 09/14/2017 1119   CREATININE 0.73 09/14/2017 1119   CALCIUM 9.7 09/14/2017 1119   GFRNONAA >60 08/01/2017 1530   GFRAA >60 08/01/2017 1530    Lipid Panel  No  results found for: CHOL, TRIG, HDL, CHOLHDL, VLDL, LDLCALC  CBC    Component Value Date/Time   WBC 4.3 09/14/2017 1119   RBC 3.85 (L) 09/14/2017 1119   HGB 11.9 (L) 09/14/2017 1119   HGB 12.4 07/03/2010 1106   HCT 35.6 (L) 09/14/2017 1119   HCT 35.5 07/03/2010 1106   PLT 227.0 09/14/2017 1119   PLT 189 07/03/2010 1106   MCV 92.3 09/14/2017 1119   MCV 87.6 07/03/2010 1106   MCH 30.0 08/01/2017 1530   MCHC 33.6 09/14/2017 1119   RDW 15.8 (H) 09/14/2017 1119   RDW 13.4 07/03/2010 1106   LYMPHSABS 1.8 08/01/2017 1530   LYMPHSABS 1.7 07/03/2010 1106   MONOABS 0.4 08/01/2017 1530   MONOABS 0.4 07/03/2010 1106   EOSABS 0.1 08/01/2017 1530   EOSABS 0.0 07/03/2010 1106   BASOSABS 0.0 08/01/2017 1530   BASOSABS 0.0 07/03/2010 1106    Hgb A1C Lab Results  Component Value Date   HGBA1C 5.8 (H) 01/22/2011           Assessment & Plan:   Shortness of Breath, DOE, Left Side Rib Pain:  Walking sats: 93% on RA Spirometry pre/post bronchodilator- she actually did worse on the post test, but reports she felt better after the albuterol eRx for Albuterol 2 puffs every 4-6 hours prn Chest xray to r/o pna Xray left side of ribs to r/o fracture  Return precautions discussed Webb Silversmith, NP

## 2017-11-08 ENCOUNTER — Encounter: Payer: Self-pay | Admitting: Primary Care

## 2017-11-08 ENCOUNTER — Ambulatory Visit (INDEPENDENT_AMBULATORY_CARE_PROVIDER_SITE_OTHER): Payer: Medicare Other | Admitting: Primary Care

## 2017-11-08 VITALS — BP 110/70 | HR 68 | Temp 98.3°F | Ht 63.0 in | Wt 108.0 lb

## 2017-11-08 DIAGNOSIS — J439 Emphysema, unspecified: Secondary | ICD-10-CM

## 2017-11-08 DIAGNOSIS — F329 Major depressive disorder, single episode, unspecified: Secondary | ICD-10-CM

## 2017-11-08 DIAGNOSIS — F419 Anxiety disorder, unspecified: Secondary | ICD-10-CM

## 2017-11-08 DIAGNOSIS — F32A Depression, unspecified: Secondary | ICD-10-CM

## 2017-11-08 MED ORDER — ESCITALOPRAM OXALATE 20 MG PO TABS: 20.0000 mg | ORAL_TABLET | Freq: Every day | ORAL | 1 refills | Status: DC

## 2017-11-08 MED ORDER — FLUTICASONE-SALMETEROL 250-50 MCG/DOSE IN AEPB: 1.0000 | INHALATION_SPRAY | Freq: Two times a day (BID) | RESPIRATORY_TRACT | 5 refills | Status: DC

## 2017-11-08 MED ORDER — ALBUTEROL SULFATE (2.5 MG/3ML) 0.083% IN NEBU: 2.5000 mg | INHALATION_SOLUTION | Freq: Four times a day (QID) | RESPIRATORY_TRACT | 0 refills | Status: DC | PRN

## 2017-11-08 NOTE — Assessment & Plan Note (Signed)
Increased anxiety over the last 6 months. GAD 7 score of 14 today. Increase Lexapro dose to 20 mg. Her daughter will update.

## 2017-11-08 NOTE — Patient Instructions (Signed)
Start fluticasone-salmeterol inhaler. Inhale 1 puff into the lungs twice daily. Use this everyday.  Use the albuterol solution every 6 hours as needed for shortness of breath/wheezing. Use this as a rescue inhaler.   We've increased the dose of your Lexapro to 20 mg. You may take two of the 10 mg tablets to make 20 mg until your bottle is empty. I will send a prescription for the 20 mg tablets to your pharmacy.   Please call me with an update in 2 weeks in regards to your breathing.   Let me know if you have any problems with the increased dose of Lexapro.   It was a pleasure to see you today!

## 2017-11-08 NOTE — Progress Notes (Signed)
Subjective:    Patient ID: [name], female    DOB: 1928/02/21, 84 y.o.   MRN: [id number]  HPI  Ms. Polich is an 84 year old female with a history of syncope, orthostatic hypotension, COPD, anxiety, dyspnea who presents today with a chief complaint of dyspnea. Her daughter would also like to discuss anxiety.  1) COPD: She was last evaluated on 11/01/17 with reports of 2 month history of shortness of breath. She felt short of breath when at rest and also with exertion. She denied URI symptoms including cough, chest pain, palpitations. She had walking oxygen saturation of 93% on room air. She underwent spirometry pre/post bronchodilator, post exam worse than pre, but she endorsed feeling better after SABA. She was prescribed albuterol inhaler to use every 4-6 hours PRN. She underwent chest xray which was negative for pneumonia, positive for COPD. Xray of the ribs without fracture.  Since her last visit she doesn't feel much better. She's using the albuterol more than twice daily with improvement, but she does have difficultly with using the device. She does have an occasional cough. She denies fevers.   She underwent CTA chest in July 2019 which was negative from PE. She underwent echocardiogram in November 2017 with grade 1 diastolic dysfunction.   2) GAD: Currently managed on Lexapro 10 mg for which she's been taking for 1-2 years. Her daughter reports that she worries a lot, she feels nervous, has mind racing thoughts, difficulty relaxing. She is interested in a dose increase. Denies SI/HI. GAD 7 score of 14 today.  Review of Systems  Constitutional: Positive for fatigue. Negative for fever.  HENT: Negative for congestion.   Respiratory: Positive for cough and shortness of breath. Negative for wheezing.   Psychiatric/Behavioral:       See HPI       Past Medical History:  Diagnosis Date  . Anxiety   . Arthritis    "hands" (10/24/2012)  . B12 deficiency    "get shots  twice/month" (10/24/2012)  . Bursitis   . Depression   . Diverticulitis   . Emphysema dx 2012  . Exertional shortness of breath   . Headache(784.0)    "weekly at least" (10/24/2012)  . Hypothyroidism   . Insomnia   . Ovarian cancer ([place])    mass hooked to sm. intestine/bladder  . Skin cancer of face   . Syncope    when neck is up or turned to side  . Syncope and collapse    "first time I ever I collapsed"; confirms h/o presyncopal events (10/24/2012)  . UTI (lower urinary tract infection)      Social History   Socioeconomic History  . Marital status: Widowed    Spouse name: Not on file  . Number of children: Not on file  . Years of education: Not on file  . Highest education level: Not on file  Occupational History  . Not on file  Social Needs  . Financial resource strain: Not on file  . Food insecurity:    Worry: Not on file    Inability: Not on file  . Transportation needs:    Medical: Not on file    Non-medical: Not on file  Tobacco Use  . Smoking status: Never Smoker  . Smokeless tobacco: Never Used  Substance and Sexual Activity  . Alcohol use: No  . Drug use: No  . Sexual activity: Never  Lifestyle  . Physical activity:    Days per week: Not on  file    Minutes per session: Not on file  . Stress: Not on file  Relationships  . Social connections:    Talks on phone: Not on file    Gets together: Not on file    Attends religious service: Not on file    Active member of club or organization: Not on file    Attends meetings of clubs or organizations: Not on file    Relationship status: Not on file  . Intimate partner violence:    Fear of current or ex partner: Not on file    Emotionally abused: Not on file    Physically abused: Not on file    Forced sexual activity: Not on file  Other Topics Concern  . Not on file  Social History Narrative  . Not on file    Past Surgical History:  Procedure Laterality Date  . ABDOMINAL HYSTERECTOMY    . CATARACT  EXTRACTION W/ INTRAOCULAR LENS  IMPLANT, BILATERAL Bilateral 2000's  . DILATION AND CURETTAGE OF UTERUS     several  . FOOT SURGERY Right 1943-1944   4 surgeries  . IR KYPHO THORACIC WITH BONE BIOPSY  07/27/2017  . THYROIDECTOMY    . TONSILLECTOMY      No family history on file.  Allergies  Allergen Reactions  . Penicillins Hives and Rash    Has patient had a PCN reaction causing immediate rash, facial/tongue/throat swelling, SOB or lightheadedness with hypotension: Yes Has patient had a PCN reaction causing severe rash involving mucus membranes or skin necrosis: Yes Has patient had a PCN reaction that required hospitalization No Has patient had a PCN reaction occurring within the last 10 years: No If all of the above answers are "NO", then may proceed with Cephalosporin use.   . Prednisone Swelling    Current Outpatient Medications on File Prior to Visit  Medication Sig Dispense Refill  . acetaminophen (TYLENOL) 650 MG CR tablet Take 650 mg by mouth every 8 (eight) hours as needed for pain.     Marland Kitchen albuterol (PROVENTIL HFA;VENTOLIN HFA) 108 (90 Base) MCG/ACT inhaler Inhale 2 puffs into the lungs every 6 (six) hours as needed for wheezing or shortness of breath. 1 Inhaler 0  . alendronate (FOSAMAX) 70 MG tablet Take 70 mg by mouth once a week. Take with a full glass of water on an empty stomach. Mondays    . Cholecalciferol (VITAMIN D) 2000 units tablet Take 2,000 Units by mouth daily.    Marland Kitchen escitalopram (LEXAPRO) 10 MG tablet Take 1 tablet by mouth once daily for anxiety/depression. 90 tablet 3  . levothyroxine (SYNTHROID, LEVOTHROID) 100 MCG tablet Take 1 tablet by mouth every morning on an empty stomach with water. No food or other medications for 30 minutes. 90 tablet 3  . vitamin B-12 (CYANOCOBALAMIN) 1000 MCG tablet Take 1,000 mcg by mouth daily.     No current facility-administered medications on file prior to visit.     BP 110/70 (BP Location: Left Arm, Patient Position:  Sitting, Cuff Size: Normal)   Pulse 68   Temp 98.3 F (36.8 C) (Oral)   Ht 5\' 3"  (1.6 m)   Wt 108 lb (49 kg)   SpO2 98%   BMI 19.13 kg/m    Objective:   Physical Exam  Constitutional: She appears well-nourished.  Neck: Neck supple.  Cardiovascular: Normal rate and regular rhythm.  Respiratory: Effort normal and breath sounds normal. She has no wheezes.  Skin: Skin is warm and dry.  Psychiatric:  She has a normal mood and affect.           Assessment & Plan:

## 2017-11-08 NOTE — Assessment & Plan Note (Signed)
Recurrent use of albuterol inhaler (with someone assisting her using the device) with temporary improvement. Rx for Advair Diskus sent to pharmacy, discussed to use this twice daily everyday.   Stop albuterol HFA. Rx for nebulizer machine provided and nebulized albuterol sent to pharmacy. Discussed that this is used as a rescue inhaler only.   She will update in 2 week.

## 2017-11-14 ENCOUNTER — Encounter: Payer: Self-pay | Admitting: *Deleted

## 2017-11-21 NOTE — Telephone Encounter (Signed)
Prior Auth was completed for Fluticasone-Salmeterol (ADVAIR DISKUS) 250-50 MCG/DOSE on 11/15/2017   [name]  This was approved:       Effective date 01/09/2017       Expiration date 01/10/2018

## 2018-02-06 ENCOUNTER — Telehealth: Payer: Self-pay | Admitting: Primary Care

## 2018-02-06 NOTE — Telephone Encounter (Signed)
Left message asking pt to call office please r/s 3/4 appointment with lisa prior to cpx with kate.  If you cannot schedule prior to appointment please change to lab appointment only and make give pt medicare wellness paperwork

## 2018-03-06 ENCOUNTER — Other Ambulatory Visit: Payer: Self-pay | Admitting: Primary Care

## 2018-03-06 DIAGNOSIS — E039 Hypothyroidism, unspecified: Secondary | ICD-10-CM

## 2018-03-06 DIAGNOSIS — Z13220 Encounter for screening for lipoid disorders: Secondary | ICD-10-CM

## 2018-03-06 DIAGNOSIS — D649 Anemia, unspecified: Secondary | ICD-10-CM

## 2018-03-15 ENCOUNTER — Ambulatory Visit: Payer: Medicare Other

## 2018-03-15 ENCOUNTER — Other Ambulatory Visit (INDEPENDENT_AMBULATORY_CARE_PROVIDER_SITE_OTHER): Payer: Medicare Other

## 2018-03-15 DIAGNOSIS — Z13220 Encounter for screening for lipoid disorders: Secondary | ICD-10-CM | POA: Diagnosis not present

## 2018-03-15 DIAGNOSIS — D649 Anemia, unspecified: Secondary | ICD-10-CM | POA: Diagnosis not present

## 2018-03-15 DIAGNOSIS — E039 Hypothyroidism, unspecified: Secondary | ICD-10-CM | POA: Diagnosis not present

## 2018-03-15 LAB — COMPREHENSIVE METABOLIC PANEL
ALBUMIN: 4.4 g/dL (ref 3.5–5.2)
ALK PHOS: 42 U/L (ref 39–117)
ALT: 8 U/L (ref 0–35)
AST: 15 U/L (ref 0–37)
BUN: 13 mg/dL (ref 6–23)
CALCIUM: 9.8 mg/dL (ref 8.4–10.5)
CHLORIDE: 98 meq/L (ref 96–112)
CO2: 31 mEq/L (ref 19–32)
Creatinine, Ser: 0.71 mg/dL (ref 0.40–1.20)
GFR: 77.76 mL/min (ref >60.00)
Glucose, Bld: 87 mg/dL (ref 70–99)
POTASSIUM: 3.8 meq/L (ref 3.5–5.1)
SODIUM: 135 meq/L (ref 135–145)
TOTAL PROTEIN: 7.2 g/dL (ref 6.0–8.3)
Total Bilirubin: 0.6 mg/dL (ref 0.2–1.2)

## 2018-03-15 LAB — LIPID PANEL
Cholesterol: 207 mg/dL — ABNORMAL HIGH (ref 0–200)
HDL: 67.2 mg/dL (ref >39.00)
LDL Cholesterol: 121 mg/dL — ABNORMAL HIGH (ref 0–99)
NonHDL: 139.87
Total CHOL/HDL Ratio: 3
Triglycerides: 94 mg/dL (ref 0.0–149.0)
VLDL: 18.8 mg/dL (ref 0.0–40.0)

## 2018-03-15 LAB — CBC
HEMATOCRIT: 34.5 % — AB (ref 36.0–46.0)
Hemoglobin: 11.7 g/dL — ABNORMAL LOW (ref 12.0–15.0)
MCHC: 33.9 g/dL (ref 30.0–36.0)
MCV: 91.7 fl (ref 78.0–100.0)
PLATELETS: 217 10*3/uL (ref 150.0–400.0)
RBC: 3.76 Mil/uL — ABNORMAL LOW (ref 3.87–5.11)
RDW: 13.2 % (ref 11.5–15.5)
WBC: 4.6 10*3/uL (ref 4.0–10.5)

## 2018-03-15 LAB — TSH: TSH: 2.89 u[IU]/mL (ref 0.35–4.50)

## 2018-03-21 ENCOUNTER — Ambulatory Visit (INDEPENDENT_AMBULATORY_CARE_PROVIDER_SITE_OTHER): Payer: Medicare Other

## 2018-03-21 ENCOUNTER — Ambulatory Visit (INDEPENDENT_AMBULATORY_CARE_PROVIDER_SITE_OTHER): Payer: Medicare Other | Admitting: Primary Care

## 2018-03-21 VITALS — BP 145/80 | HR 64 | Temp 98.5°F | Ht 61.5 in | Wt 101.2 lb

## 2018-03-21 DIAGNOSIS — F329 Major depressive disorder, single episode, unspecified: Secondary | ICD-10-CM

## 2018-03-21 DIAGNOSIS — F419 Anxiety disorder, unspecified: Secondary | ICD-10-CM | POA: Diagnosis not present

## 2018-03-21 DIAGNOSIS — R32 Unspecified urinary incontinence: Secondary | ICD-10-CM | POA: Diagnosis not present

## 2018-03-21 DIAGNOSIS — F32A Depression, unspecified: Secondary | ICD-10-CM

## 2018-03-21 DIAGNOSIS — E039 Hypothyroidism, unspecified: Secondary | ICD-10-CM

## 2018-03-21 DIAGNOSIS — J439 Emphysema, unspecified: Secondary | ICD-10-CM

## 2018-03-21 DIAGNOSIS — R42 Dizziness and giddiness: Secondary | ICD-10-CM | POA: Diagnosis not present

## 2018-03-21 DIAGNOSIS — S22000A Wedge compression fracture of unspecified thoracic vertebra, initial encounter for closed fracture: Secondary | ICD-10-CM

## 2018-03-21 DIAGNOSIS — Z0000 Encounter for general adult medical examination without abnormal findings: Secondary | ICD-10-CM | POA: Diagnosis not present

## 2018-03-21 MED ORDER — SERTRALINE HCL 25 MG PO TABS: 25.0000 mg | ORAL_TABLET | Freq: Every day | ORAL | 0 refills | Status: DC

## 2018-03-21 NOTE — Assessment & Plan Note (Signed)
Compliant to alendronate weekly.  Continue same.  Repeat bone density scan in 2021.

## 2018-03-21 NOTE — Assessment & Plan Note (Signed)
She is taking levothyroxine appropriately.  Recent TSH unremarkable.  Continue levothyroxine 100 mcg.

## 2018-03-21 NOTE — Assessment & Plan Note (Signed)
Using Advair infrequently, discussed this is to be used twice daily every day.  She is using nebulizer albuterol twice daily, discussed that this is an as needed medication.  Her daughter is with her today who will encourage twice daily use of Advair and as needed use of albuterol.

## 2018-03-21 NOTE — Assessment & Plan Note (Signed)
Lexapro appears no longer effective.  Will change to Zoloft 25 mg, discussed directions for use.  We will plan to see her back in 2 months for follow-up.  Denies SI/HI.

## 2018-03-21 NOTE — Assessment & Plan Note (Signed)
Intermittent and overall well controlled.

## 2018-03-21 NOTE — Patient Instructions (Signed)
Stop Lexapro 20 mg tablets for anxiety and depression.  Start sertraline (Zoloft) 25 mg for anxiety and depression. Take 1 tablet daily.   Start using your Advair inhaler twice daily as prescribed. Use the albuterol nebulizer only as needed.  It's important to improve your diet by reducing consumption of fast food, fried food, processed snack foods, sugary drinks. Increase consumption of fresh vegetables and fruits, whole grains, water.  Ensure you are drinking 64 ounces of water daily.  Please schedule a follow up appointment in 2 months for anxiety evaluation.   It was a pleasure to see you today!

## 2018-03-21 NOTE — Assessment & Plan Note (Signed)
Wearing pads during the day and briefs at night. Discussed options for treatment, she will think about this and call back.  We discussed the pros and cons of treatment for incontinence.

## 2018-03-21 NOTE — Patient Instructions (Signed)
Ms. Lichter , Thank you for taking time to come for your Medicare Wellness Visit. I appreciate your ongoing commitment to your health goals. Please review the following plan we discussed and let me know if I can assist you in the future.   These are the goals we discussed: Goals    . Patient Stated     Starting 03/21/18, I will continue to take medications as prescribed.        This is a list of the screening recommended for you and due dates:  Health Maintenance  Topic Date Due  . Tetanus Vaccine  01/11/2020*  . Pneumonia vaccines (1 of 2 - PCV13) 01/11/2020*  . Flu Shot  Completed  . DEXA scan (bone density measurement)  Completed  *Topic was postponed. The date shown is not the original due date.   Preventive Care for Adults  A healthy lifestyle and preventive care can promote health and wellness. Preventive health guidelines for adults include the following key practices.  . A routine yearly physical is a good way to check with your health care provider about your health and preventive screening. It is a chance to share any concerns and updates on your health and to receive a thorough exam.  . Visit your dentist for a routine exam and preventive care every 6 months. Brush your teeth twice a day and floss once a day. Good oral hygiene prevents tooth decay and gum disease.  . The frequency of eye exams is based on your age, health, family medical history, use  of contact lenses, and other factors. Follow your health care provider's recommendations for frequency of eye exams.  . Eat a healthy diet. Foods like vegetables, fruits, whole grains, low-fat dairy products, and lean protein foods contain the nutrients you need without too many calories. Decrease your intake of foods high in solid fats, added sugars, and salt. Eat the right amount of calories for you. Get information about a proper diet from your health care provider, if necessary.  . Regular physical exercise is one of the  most important things you can do for your health. Most adults should get at least 150 minutes of moderate-intensity exercise (any activity that increases your heart rate and causes you to sweat) each week. In addition, most adults need muscle-strengthening exercises on 2 or more days a week.  Silver Sneakers may be a benefit available to you. To determine eligibility, you may visit the website: www.silversneakers.com or contact program at (781)471-3052 Mon-Fri between 8AM-8PM.   . Maintain a healthy weight. The body mass index (BMI) is a screening tool to identify possible weight problems. It provides an estimate of body fat based on height and weight. Your health care provider can find your BMI and can help you achieve or maintain a healthy weight.   For adults 20 years and older: ? A BMI below 18.5 is considered underweight. ? A BMI of 18.5 to 24.9 is normal. ? A BMI of 25 to 29.9 is considered overweight. ? A BMI of 30 and above is considered obese.   . Maintain normal blood lipids and cholesterol levels by exercising and minimizing your intake of saturated fat. Eat a balanced diet with plenty of fruit and vegetables. Blood tests for lipids and cholesterol should begin at age 73 and be repeated every 5 years. If your lipid or cholesterol levels are high, you are over 50, or you are at high risk for heart disease, you may need your cholesterol levels checked  more frequently. Ongoing high lipid and cholesterol levels should be treated with medicines if diet and exercise are not working.  . If you smoke, find out from your health care provider how to quit. If you do not use tobacco, please do not start.  . If you choose to drink alcohol, please do not consume more than 2 drinks per day. One drink is considered to be 12 ounces (355 mL) of beer, 5 ounces (148 mL) of wine, or 1.5 ounces (44 mL) of liquor.  . If you are 70-70 years old, ask your health care provider if you should take aspirin to  prevent strokes.  . Use sunscreen. Apply sunscreen liberally and repeatedly throughout the day. You should seek shade when your shadow is shorter than you. Protect yourself by wearing long sleeves, pants, a wide-brimmed hat, and sunglasses year round, whenever you are outdoors.  . Once a month, do a whole body skin exam, using a mirror to look at the skin on your back. Tell your health care provider of new moles, moles that have irregular borders, moles that are larger than a pencil eraser, or moles that have changed in shape or color.

## 2018-03-21 NOTE — Progress Notes (Signed)
Subjective:   [name] is a 85 y.o. female who presents for Medicare Annual (Subsequent) preventive examination.  Review of Systems:  N/A Cardiac Risk Factors include: advanced age (>42men, >72 women)     Objective:     Vitals: BP (!) 145/80 (BP Location: Right Arm, Patient Position: Sitting, Cuff Size: Normal)   Pulse 64   Temp 98.5 F (36.9 C) (Oral)   Ht 5' 1.5" (1.562 m) Comment: shoes  Wt 101 lb 3.2 oz (45.9 kg)   SpO2 93%   BMI 18.81 kg/m   Body mass index is 18.81 kg/m.  Advanced Directives 03/21/2018 07/27/2017 06/20/2016 10/24/2012 01/25/2011 01/21/2011  Does Patient Have a Medical Advance Directive? Yes No No Patient has advance directive, copy not in chart Patient has advance directive, copy not in chart Patient has advance directive, copy not in chart  Type of Advance Directive [place];Living will - - - Special educational needs teacher of [place];Living will  Copy of [place] in Chart? No - copy requested - - Copy requested from family Copy requested from family Copy requested from family  Would patient like information on creating a medical advance directive? - No - Patient declined - - - -  Pre-existing out of facility DNR order (yellow form or pink MOST form) - - - - Other (comment) -    Tobacco Social History   Tobacco Use  Smoking Status Never Smoker  Smokeless Tobacco Never Used     Counseling given: No   Clinical Intake:  Pre-visit preparation completed: Yes  Pain : No/denies pain Pain Score: 0-No pain     Nutritional Status: BMI of 19-24  Normal Nutritional Risks: None Diabetes: No  How often do you need to have someone help you when you read instructions, pamphlets, or other written materials from your doctor or pharmacy?: 1 - Never What is the last grade level you completed in school?: 10th grade  Interpreter Needed?: No  Comments: pt is a widow; lives with son  Past Medical  History:  Diagnosis Date  . Anxiety   . Arthritis    "hands" (10/24/2012)  . B12 deficiency    "get shots twice/month" (10/24/2012)  . Bursitis   . Depression   . Diverticulitis   . Emphysema dx 2012  . Exertional shortness of breath   . Headache(784.0)    "weekly at least" (10/24/2012)  . Hypothyroidism   . Insomnia   . Ovarian cancer ([place])    mass hooked to sm. intestine/bladder  . Skin cancer of face   . Syncope    when neck is up or turned to side  . Syncope and collapse    "first time I ever I collapsed"; confirms h/o presyncopal events (10/24/2012)  . UTI (lower urinary tract infection)    Past Surgical History:  Procedure Laterality Date  . ABDOMINAL HYSTERECTOMY    . CATARACT EXTRACTION W/ INTRAOCULAR LENS  IMPLANT, BILATERAL Bilateral 2000's  . DILATION AND CURETTAGE OF UTERUS     several  . FOOT SURGERY Right 1943-1944   4 surgeries  . IR KYPHO THORACIC WITH BONE BIOPSY  07/27/2017  . THYROIDECTOMY    . TONSILLECTOMY     History reviewed. No pertinent family history. Social History   Socioeconomic History  . Marital status: Widowed    Spouse name: Not on file  . Number of children: Not on file  . Years of education: Not on file  . Highest  education level: Not on file  Occupational History  . Not on file  Social Needs  . Financial resource strain: Not on file  . Food insecurity:    Worry: Not on file    Inability: Not on file  . Transportation needs:    Medical: Not on file    Non-medical: Not on file  Tobacco Use  . Smoking status: Never Smoker  . Smokeless tobacco: Never Used  Substance and Sexual Activity  . Alcohol use: No  . Drug use: No  . Sexual activity: Never  Lifestyle  . Physical activity:    Days per week: Not on file    Minutes per session: Not on file  . Stress: Not on file  Relationships  . Social connections:    Talks on phone: Not on file    Gets together: Not on file    Attends religious service: Not on file    Active  member of club or organization: Not on file    Attends meetings of clubs or organizations: Not on file    Relationship status: Not on file  Other Topics Concern  . Not on file  Social History Narrative  . Not on file    Outpatient Encounter Medications as of 03/21/2018  Medication Sig  . acetaminophen (TYLENOL) 650 MG CR tablet Take 650 mg by mouth every 8 (eight) hours as needed for pain.   Marland Kitchen albuterol (PROVENTIL) (2.5 MG/3ML) 0.083% nebulizer solution Take 3 mLs (2.5 mg total) by nebulization every 6 (six) hours as needed for wheezing or shortness of breath.  Marland Kitchen alendronate (FOSAMAX) 70 MG tablet Take 70 mg by mouth once a week. Take with a full glass of water on an empty stomach. Mondays  . Cholecalciferol (VITAMIN D) 2000 units tablet Take 2,000 Units by mouth daily.  Marland Kitchen escitalopram (LEXAPRO) 20 MG tablet Take 1 tablet (20 mg total) by mouth daily. For anxiety and depression.  . Fluticasone-Salmeterol (ADVAIR DISKUS) 250-50 MCG/DOSE AEPB Inhale 1 puff into the lungs 2 (two) times daily.  Marland Kitchen levothyroxine (SYNTHROID, LEVOTHROID) 100 MCG tablet Take 1 tablet by mouth every morning on an empty stomach with water. No food or other medications for 30 minutes.  . vitamin B-12 (CYANOCOBALAMIN) 1000 MCG tablet Take 1,000 mcg by mouth daily.   No facility-administered encounter medications on file as of 03/21/2018.     Activities of Daily Living In your present state of health, do you have any difficulty performing the following activities: 03/21/2018 07/27/2017  Hearing? N N  Vision? N N  Difficulty concentrating or making decisions? N -  Walking or climbing stairs? Y N  Dressing or bathing? N N  Doing errands, shopping? Y -  Conservation officer, nature and eating ? N -  Using the Toilet? N -  In the past six months, have you accidently leaked urine? Y -  Do you have problems with loss of bowel control? N -  Managing your Medications? Y -  Managing your Finances? Y -  Housekeeping or managing your  Housekeeping? Y -  Some recent data might be hidden    Patient Care Team: Pleas Koch, NP as PCP - General (Internal Medicine)    Assessment:   This is a routine wellness examination for [name].   Hearing Screening   125Hz  250Hz  500Hz  1000Hz  2000Hz  3000Hz  4000Hz  6000Hz  8000Hz   Right ear:   40 0 40  0    Left ear:   40 0 0  0  Visual Acuity Screening   Right eye Left eye Both eyes  Without correction:     With correction: 20/25 20/25-1 20/25-1    Exercise Activities and Dietary recommendations Current Exercise Habits: The patient does not participate in regular exercise at present, Exercise limited by: None identified  Goals    . Patient Stated     Starting 03/21/18, I will continue to take medications as prescribed.        Fall Risk Fall Risk  03/21/2018  Falls in the past year? 1  Number falls in past yr: 1  Injury with Fall? 1  Risk for fall due to : Impaired balance/gait;Impaired mobility;History of fall(s)   Depression Screen PHQ 2/9 Scores 03/21/2018  PHQ - 2 Score 3  PHQ- 9 Score 10     Cognitive Function MMSE - Mini Mental State Exam 03/21/2018  Orientation to time 3  Orientation to time comments disoriented to year despite cues given  Orientation to Place 5  Registration 3  Attention/ Calculation 0  Recall 3  Language- name 2 objects 0  Language- repeat 1  Language- follow 3 step command 2  Language- follow 3 step command-comments unable to follow 1 step of 3 step command despite cues given  Language- read & follow direction 0  Write a sentence 0  Copy design 0  Total score 17     PLEASE NOTE: A Mini-Cog screen was completed. Maximum score is 20. A value of 0 denotes this part of [place] MMSE was not completed or the patient failed this part of the Mini-Cog screening.   Mini-Cog Screening Orientation to Time - Max 5 pts Orientation to Place - Max 5 pts Registration - Max 3 pts Recall - Max 3 pts Language Repeat - Max 1 pts Language  Follow 3 Step Command - Max 3 pts     Immunization History  Administered Date(s) Administered  . Influenza,inj,Quad PF,6+ Mos 09/14/2017  . Influenza-Unspecified 10/23/2012  . Zoster 06/12/2015    Screening Tests Health Maintenance  Topic Date Due  . TETANUS/TDAP  01/11/2020 (Originally 08/04/1947)  . PNA vac Low Risk Adult (1 of 2 - PCV13) 01/11/2020 (Originally 08/05/1995)  . INFLUENZA VACCINE  Completed  . DEXA SCAN  Completed     Plan:     I have personally reviewed, addressed, and noted the following in the patient's chart:  A. Medical and social history B. Use of alcohol, tobacco or illicit drugs  C. Current medications and supplements D. Functional ability and status E.  Nutritional status F.  Physical activity G. Advance directives H. List of other physicians I.  Hospitalizations, surgeries, and ER visits in previous 12 months J.  [place] to include hearing, vision, cognitive, depression L. Referrals and appointments - none  In addition, I have reviewed and discussed with patient certain preventive protocols, quality metrics, and best practice recommendations. A written personalized care plan for preventive services as well as general preventive health recommendations were provided to patient.  See attached scanned questionnaire for additional information.   Signed,   Lindell Noe, MHA, BS, LPN Health Coach

## 2018-03-21 NOTE — Progress Notes (Signed)
PCP  Please discuss PNA vaccine with patient (is it necessary). Also, patient reports increased anxiety and wants to discuss a stronger "nerve pill".  AWV note completed and forwarded for your review.

## 2018-03-21 NOTE — Progress Notes (Signed)
PCP notes:   Health maintenance:  PPSV23 - PCP follow-up requested Tetanus vaccine - postponed/insurance  Abnormal screenings:   Hearing - failed  Hearing Screening   125Hz  250Hz  500Hz  1000Hz  2000Hz  3000Hz  4000Hz  6000Hz  8000Hz   Right ear:   40 0 40  0    Left ear:   40 0 0  0      Visual Acuity Screening   Right eye Left eye Both eyes  Without correction:     With correction: 20/25 20/25-1 20/25-1   Fall risk - hx of multiple falls Fall Risk  03/21/2018  Falls in the past year? 1  Number falls in past yr: 1  Injury with Fall? 1  Risk for fall due to : Impaired balance/gait;Impaired mobility;History of fall(s)   Mini-Cog score: 17/20 MMSE - Mini Mental State Exam 03/21/2018  Orientation to time 3  Orientation to time comments disoriented to year despite cues given  Orientation to Place 5  Registration 3  Attention/ Calculation 0  Recall 3  Language- name 2 objects 0  Language- repeat 1  Language- follow 3 step command 2  Language- follow 3 step command-comments unable to follow 1 step of 3 step command despite cues given  Language- read & follow direction 0  Write a sentence 0  Copy design 0  Total score 17     Depression score: 10 Depression screen PHQ 2/9 03/21/2018  Decreased Interest 0  Down, Depressed, Hopeless 3  PHQ - 2 Score 3  Altered sleeping 0  Tired, decreased energy 3  Change in appetite 3  Feeling bad or failure about yourself  0  Trouble concentrating 0  Moving slowly or fidgety/restless 0  Suicidal thoughts 1  PHQ-9 Score 10  Difficult doing work/chores Not difficult at all   Patient concerns:   Anxiety - pt states she needs a stronger "nerve pill".  Nurse concerns:  None  Next PCP appt:   03/21/18 @ 1500

## 2018-03-21 NOTE — Progress Notes (Signed)
Subjective:    Patient ID: [name], female    DOB: 20-Jun-1928, 85 y.o.   MRN: [id number]  HPI  [name] is an 85 year old female who presents today for [place] 2. She saw our health advisor this afternoon.   Immunizations: -Influenza: Completed this season  -Pneumonia: Patient endorses she's had pneumonia vaccinations.  -Shingles: Completed Zostavax in 2017  Diet: She endorses a fair diet. Breakfast: Cereal, bacon, sausage, eggs, toast Lunch: Sometimes skips, Ensure Dinner: Meat, vegetable, starch Snacks: Crackers, muffins  Desserts: 1-2 times weekly  Beverages: Gatorade, Ensure, sweet tea, coffee, little water  Exercise: She is not exercising, is active at home Eye exam: No recent eye exam Dental exam: No recent exam Dexa: September 2019 Mammogram: September 2019  BP Readings from Last 3 Encounters:  03/21/18 (!) 145/80  03/21/18 (!) 145/80  11/08/17 110/70     Review of Systems  Respiratory:       Denies increased shortness of breath  Cardiovascular: Negative for chest pain.  Gastrointestinal: Negative for constipation and diarrhea.  Genitourinary:       Urinary incontinence   Neurological:       Some dizziness with abrupt position changes.   Psychiatric/Behavioral:       Continued anxiety since last visit despite dose increase of Lexapro. Symptoms of feeling shaky, getting upset, daily worry, anxious that are daily. GAD 7 score of 14 today.        Past Medical History:  Diagnosis Date  . Anxiety   . Arthritis    "hands" (10/24/2012)  . B12 deficiency    "get shots twice/month" (10/24/2012)  . Bursitis   . Depression   . Diverticulitis   . Emphysema dx 2012  . Exertional shortness of breath   . Headache(784.0)    "weekly at least" (10/24/2012)  . Hypothyroidism   . Insomnia   . Ovarian cancer ([place])    mass hooked to sm. intestine/bladder  . Skin cancer of face   . Syncope    when neck is up or turned to side  . Syncope and collapse     "first time I ever I collapsed"; confirms h/o presyncopal events (10/24/2012)  . UTI (lower urinary tract infection)      Social History   Socioeconomic History  . Marital status: Widowed    Spouse name: Not on file  . Number of children: Not on file  . Years of education: Not on file  . Highest education level: Not on file  Occupational History  . Not on file  Social Needs  . Financial resource strain: Not on file  . Food insecurity:    Worry: Not on file    Inability: Not on file  . Transportation needs:    Medical: Not on file    Non-medical: Not on file  Tobacco Use  . Smoking status: Never Smoker  . Smokeless tobacco: Never Used  Substance and Sexual Activity  . Alcohol use: No  . Drug use: No  . Sexual activity: Never  Lifestyle  . Physical activity:    Days per week: Not on file    Minutes per session: Not on file  . Stress: Not on file  Relationships  . Social connections:    Talks on phone: Not on file    Gets together: Not on file    Attends religious service: Not on file    Active member of club or organization: Not on file  Attends meetings of clubs or organizations: Not on file    Relationship status: Not on file  . Intimate partner violence:    Fear of current or ex partner: Not on file    Emotionally abused: Not on file    Physically abused: Not on file    Forced sexual activity: Not on file  Other Topics Concern  . Not on file  Social History Narrative  . Not on file    Past Surgical History:  Procedure Laterality Date  . ABDOMINAL HYSTERECTOMY    . CATARACT EXTRACTION W/ INTRAOCULAR LENS  IMPLANT, BILATERAL Bilateral 2000's  . DILATION AND CURETTAGE OF UTERUS     several  . FOOT SURGERY Right 1943-1944   4 surgeries  . IR KYPHO THORACIC WITH BONE BIOPSY  07/27/2017  . THYROIDECTOMY    . TONSILLECTOMY      No family history on file.  Allergies  Allergen Reactions  . Penicillins Hives and Rash    Has patient had a PCN reaction  causing immediate rash, facial/tongue/throat swelling, SOB or lightheadedness with hypotension: Yes Has patient had a PCN reaction causing severe rash involving mucus membranes or skin necrosis: Yes Has patient had a PCN reaction that required hospitalization No Has patient had a PCN reaction occurring within the last 10 years: No If all of the above answers are "NO", then may proceed with Cephalosporin use.   . Prednisone Swelling    Current Outpatient Medications on File Prior to Visit  Medication Sig Dispense Refill  . acetaminophen (TYLENOL) 650 MG CR tablet Take 650 mg by mouth every 8 (eight) hours as needed for pain.     Marland Kitchen albuterol (PROVENTIL) (2.5 MG/3ML) 0.083% nebulizer solution Take 3 mLs (2.5 mg total) by nebulization every 6 (six) hours as needed for wheezing or shortness of breath. 150 mL 0  . alendronate (FOSAMAX) 70 MG tablet Take 70 mg by mouth once a week. Take with a full glass of water on an empty stomach. Mondays    . Cholecalciferol (VITAMIN D) 2000 units tablet Take 2,000 Units by mouth daily.    Marland Kitchen escitalopram (LEXAPRO) 20 MG tablet Take 1 tablet (20 mg total) by mouth daily. For anxiety and depression. 90 tablet 1  . Fluticasone-Salmeterol (ADVAIR DISKUS) 250-50 MCG/DOSE AEPB Inhale 1 puff into the lungs 2 (two) times daily. 1 each 5  . levothyroxine (SYNTHROID, LEVOTHROID) 100 MCG tablet Take 1 tablet by mouth every morning on an empty stomach with water. No food or other medications for 30 minutes. 90 tablet 3  . vitamin B-12 (CYANOCOBALAMIN) 1000 MCG tablet Take 1,000 mcg by mouth daily.     No current facility-administered medications on file prior to visit.     BP (!) 145/80 (BP Location: Right Arm, Patient Position: Sitting, Cuff Size: Normal)   Pulse 64   Temp 98.5 F (36.9 C) (Oral)   Ht 5' 1.5" (1.562 m) Comment: shoes  Wt 101 lb 3.2 oz (45.9 kg)   SpO2 93%   BMI 18.81 kg/m    Objective:   Physical Exam  Constitutional: She appears  well-nourished.  Neck: Neck supple.  Cardiovascular: Normal rate and regular rhythm.  Respiratory: Effort normal and breath sounds normal.  Skin: Skin is warm and dry.  Psychiatric: She has a normal mood and affect.           Assessment & Plan:

## 2018-03-22 NOTE — Progress Notes (Signed)
I reviewed health advisor's note, was available for consultation, and agree with documentation and plan.  

## 2018-03-31 ENCOUNTER — Telehealth: Payer: Self-pay

## 2018-03-31 NOTE — Telephone Encounter (Signed)
Also daughter states maybe Sertraline dose too strong and if she does need to stay on this medication maybe she should try taking 1/2 tablet. Daughter aware that it may be Monday before we can call back

## 2018-03-31 NOTE — Telephone Encounter (Signed)
Sunday Spillers, patient's daughter, called and states that patient has been jittery, shaky like for at least 1 week or so now. Getting worse. She does say patient had some jitteriness at her office visit on 03/21/2018 but it is worse. On that visit patient was told to stop Zoloft and start Sertraline. Wonders if this could be the cause? Can patient go back to Lexapro and try a bigger dose? Patient is not in distress. She is alert. No chest pain or tightness. SOB present but chronic due to COPD and it is not worse. She does have a slight headache also and head feels a little different. No cardiac symptoms. Please review. CB to [place] at [phone].

## 2018-04-02 NOTE — Telephone Encounter (Signed)
Please check on patient. Is she still feeling jittery? Is she still taking the sertraline (Zoloft)? She is already on the lowest dose and these symptoms can be expected when switching from one medication to another, but these symptoms should pass.  Ok to try 1/2 tablet if they prefer.

## 2018-04-03 NOTE — Telephone Encounter (Signed)
Called and spoke with pt's daughter, she stated she is still feeling jitery and she would go to 1/2 tablet to aee if it helps.  Jordie Skalsky,cma

## 2018-05-01 ENCOUNTER — Telehealth: Payer: Self-pay

## 2018-05-01 ENCOUNTER — Ambulatory Visit (INDEPENDENT_AMBULATORY_CARE_PROVIDER_SITE_OTHER): Payer: Medicare Other | Admitting: Primary Care

## 2018-05-01 DIAGNOSIS — R059 Cough, unspecified: Secondary | ICD-10-CM | POA: Insufficient documentation

## 2018-05-01 DIAGNOSIS — R05 Cough: Secondary | ICD-10-CM

## 2018-05-01 NOTE — Patient Instructions (Signed)
Use the breathing treatments as needed for shortness of breath. Continue to use your Advair twice daily, everyday.  Please call me if you start coughing up green sputum, you notice an increase in sputum production, and/or if you notice an increase in shortness of breath.   Please call me if you see temperatures at or above 101.  It was a pleasure to see you today! Allie Bossier, NP-C

## 2018-05-01 NOTE — Progress Notes (Signed)
Subjective:    Patient ID: [name], female    DOB: [birth year], 85 y.o.   MRN: [id number]  HPI  Virtual Visit via Video Note  I connected with [name] on 05/01/18 at 11:20 AM EDT by a video enabled telemedicine application and verified that I am speaking with the correct person using two identifiers.   I discussed the limitations of evaluation and management by telemedicine and the availability of in person appointments. The patient expressed understanding and agreed to proceed. She is at home, I am in the office. Her daughter is assisting with HPI.   History of Present Illness:  [name] is an 85 year old female with a history of COPD, Hypothyroidism, Dyspnea, Vertigo who presents today via video with a chief complaint of cough.  Her daughter is with her today who endorses a cough that began four days ago, she has not coughed within the last 24 hours. Her daughter has been checking her temperature and has noticed readings between normal to 99.8 over the last several days.  Her BP is running 140/67 with a HR of 74 and temp of 98.7 today. She's been using breathing treatments over the last two days with improvement, she's also compliant to her Adviar BID as prescribed. She's also been outdoors recently with her walker and her symptoms began shortly after being outdoors. Today she's feeling better, her daughter agrees that she appears better. She denies increased sputum production, productive cough, shortness of breath.    Observations/Objective:  Alert and oriented. Speaking in complete sentences. No respiratory distress. No cough or wheezing during exam.  Assessment and Plan:  Suspect more allergy involvement, especially since symptoms began after she was outdoors. She does not meet criteria for COPD exacerbation. Will have her continue with her current regimen and using albuterol treatments as needed.  Daughter will update with any changes.   Follow Up Instructions:   Use the breathing treatments as needed for shortness of breath. Continue to use your Advair twice daily, everyday.  Please call me if you start coughing up green sputum, you notice an increase in sputum production, and/or if you notice an increase in shortness of breath.   Please call me if you see temperatures at or above 101.  It was a pleasure to see you today! Allie Bossier, NP-C    I discussed the assessment and treatment plan with the patient. The patient was provided an opportunity to ask questions and all were answered. The patient agreed with the plan and demonstrated an understanding of the instructions.   The patient was advised to call back or seek an in-person evaluation if the symptoms worsen or if the condition fails to improve as anticipated.    Pleas Koch, NP    Review of Systems  Constitutional: Negative for fever.  HENT: Negative for congestion, sinus pressure and sore throat.   Respiratory: Negative for cough and shortness of breath.   Cardiovascular: Negative for chest pain.  Neurological: Negative for dizziness and headaches.  Psychiatric/Behavioral: Negative for confusion.        Past Medical History:  Diagnosis Date  . Anxiety   . Arthritis    "hands" (10/24/2012)  . B12 deficiency    "get shots twice/month" (10/24/2012)  . Bursitis   . Depression   . Diverticulitis   . Emphysema dx 2012  . Exertional shortness of breath   . Headache(784.0)    "weekly at least" (10/24/2012)  . Hypothyroidism   .  Insomnia   . Ovarian cancer ([place])    mass hooked to sm. intestine/bladder  . Skin cancer of face   . Syncope    when neck is up or turned to side  . Syncope and collapse    "first time I ever I collapsed"; confirms h/o presyncopal events (10/24/2012)  . UTI (lower urinary tract infection)      Social History   Socioeconomic History  . Marital status: Widowed    Spouse name: Not on file  . Number of children: Not on file  . Years of  education: Not on file  . Highest education level: Not on file  Occupational History  . Not on file  Social Needs  . Financial resource strain: Not on file  . Food insecurity:    Worry: Not on file    Inability: Not on file  . Transportation needs:    Medical: Not on file    Non-medical: Not on file  Tobacco Use  . Smoking status: Never Smoker  . Smokeless tobacco: Never Used  Substance and Sexual Activity  . Alcohol use: No  . Drug use: No  . Sexual activity: Never  Lifestyle  . Physical activity:    Days per week: Not on file    Minutes per session: Not on file  . Stress: Not on file  Relationships  . Social connections:    Talks on phone: Not on file    Gets together: Not on file    Attends religious service: Not on file    Active member of club or organization: Not on file    Attends meetings of clubs or organizations: Not on file    Relationship status: Not on file  . Intimate partner violence:    Fear of current or ex partner: Not on file    Emotionally abused: Not on file    Physically abused: Not on file    Forced sexual activity: Not on file  Other Topics Concern  . Not on file  Social History Narrative  . Not on file    Past Surgical History:  Procedure Laterality Date  . ABDOMINAL HYSTERECTOMY    . CATARACT EXTRACTION W/ INTRAOCULAR LENS  IMPLANT, BILATERAL Bilateral 2000's  . DILATION AND CURETTAGE OF UTERUS     several  . FOOT SURGERY Right 1943-1944   4 surgeries  . IR KYPHO THORACIC WITH BONE BIOPSY  07/27/2017  . THYROIDECTOMY    . TONSILLECTOMY      No family history on file.  Allergies  Allergen Reactions  . Penicillins Hives and Rash    Has patient had a PCN reaction causing immediate rash, facial/tongue/throat swelling, SOB or lightheadedness with hypotension: Yes Has patient had a PCN reaction causing severe rash involving mucus membranes or skin necrosis: Yes Has patient had a PCN reaction that required hospitalization No Has  patient had a PCN reaction occurring within the last 10 years: No If all of the above answers are "NO", then may proceed with Cephalosporin use.   . Prednisone Swelling    Current Outpatient Medications on File Prior to Visit  Medication Sig Dispense Refill  . acetaminophen (TYLENOL) 650 MG CR tablet Take 650 mg by mouth every 8 (eight) hours as needed for pain.     Marland Kitchen albuterol (PROVENTIL) (2.5 MG/3ML) 0.083% nebulizer solution Take 3 mLs (2.5 mg total) by nebulization every 6 (six) hours as needed for wheezing or shortness of breath. 150 mL 0  . alendronate (FOSAMAX) 70  MG tablet Take 70 mg by mouth once a week. Take with a full glass of water on an empty stomach. Mondays    . Cholecalciferol (VITAMIN D) 2000 units tablet Take 2,000 Units by mouth daily.    . Fluticasone-Salmeterol (ADVAIR DISKUS) 250-50 MCG/DOSE AEPB Inhale 1 puff into the lungs 2 (two) times daily. 1 each 5  . levothyroxine (SYNTHROID, LEVOTHROID) 100 MCG tablet Take 1 tablet by mouth every morning on an empty stomach with water. No food or other medications for 30 minutes. 90 tablet 3  . sertraline (ZOLOFT) 25 MG tablet Take 1 tablet (25 mg total) by mouth daily. For anxiety and depression. 90 tablet 0  . vitamin B-12 (CYANOCOBALAMIN) 1000 MCG tablet Take 1,000 mcg by mouth daily.     No current facility-administered medications on file prior to visit.     BP 140/67   Pulse 74   Temp 98.7 F (37.1 C)    Objective:   Physical Exam  Constitutional: She is oriented to person, place, and time. She appears well-nourished. She does not have a sickly appearance. She does not appear ill.  Respiratory: Effort normal. No respiratory distress.  Neurological: She is alert and oriented to person, place, and time.  Psychiatric: She has a normal mood and affect.           Assessment & Plan:

## 2018-05-01 NOTE — Assessment & Plan Note (Signed)
Suspect more allergy involvement, especially since symptoms began after she was outdoors. She does not meet criteria for COPD exacerbation. Will have her continue with her current regimen and using albuterol treatments as needed.  Daughter will update with any changes.   All symptoms have resolved.

## 2018-05-01 NOTE — Telephone Encounter (Signed)
[facility] Patient Name: [name] Gender: Female DOB: 1928/09/15 Age: 85 Y 84 M 24 D Return Phone Number: [phone] (Primary), [phone] (Secondary) Address[place] [address] Client [facility] Night - Client Client Site [place] Physician Alma Friendly - NP Contact Type Call Who Is Calling Patient / Member / Family / Caregiver Call Type Triage / Clinical Caller Name Bevelyn Buckles Relationship To Patient Daughter Return Phone Number [phone] (Primary) Chief Complaint Cough Reason for Call Symptomatic / Request for [place] states her mother has a temp of 99.9 and has a cough. She is worried about Covid-19 Translation No Nurse Assessment Nurse: London Pepper, RN, Jeneen Rinks Date/Time Eilene Ghazi Time): 04/29/2018 4:05:34 PM Confirm and document reason for call. If symptomatic, describe symptoms. ---Caller states pt developed mild non-productive cough, highest temp 99.8. Now 98. States she does get short of breath because of her COPD. Denies chest pain. Has the patient had close contact with a person known or suspected to have the novel coronavirus illness OR traveled / lives in area with major community spread (including international travel) in the last 14 days from the onset of symptoms? * If Asymptomatic, screen for exposure and travel within the last 14 days. ---No Does the patient have any new or worsening symptoms? ---Yes Will a triage be completed? ---Yes Related visit to physician within the last 2 weeks? ---No Does the PT have any chronic conditions? (i.e. diabetes, asthma, this includes High risk factors for pregnancy, etc.) ---Yes List chronic conditions. ---COPD Is this a behavioral health or substance abuse call? ---No Guidelines Guideline Title Affirmed  Question Affirmed Notes Nurse Date/Time (Eastern Time) Coronavirus (COVID-19) - Diagnosed or Suspected HIGH RISK patient (e.g., age > 84 years, diabetes, heart or lung disease, weak immune system) London Pepper, RNJeneen Rinks 04/29/2018 4:07:22 PM PLEASE NOTE: All timestamps contained within this report are represented as [place] Standard Time. CONFIDENTIALTY NOTICE: This fax transmission is intended only for the addressee. It contains information that is legally privileged, confidential or otherwise protected from use or disclosure. If you are not the intended recipient, you are strictly prohibited from reviewing, disclosing, copying using or disseminating any of this information or taking any action in reliance on or regarding this information. If you have received this fax in error, please notify us immediately by telephone so that we can arrange for its return to [place]. Phone: [phone], Toll-Free: [phone], Fax: [phone] Page: 2 of 2 Call Id: [id number] [place]. Time Eilene Ghazi Time) Disposition Final User 04/29/2018 4:10:19 PM Called On-Call Provider London Pepper, RN, Jeneen Rinks 04/29/2018 4:15:28 PM Call PCP Now Yes London Pepper, RN, Martin Majestic Disagree/Comply Comply Caller Understands Yes PreDisposition Go to ED Care Advice Given Per Guideline CALL PCP NOW: * I'll page the on-call provider now. If you haven't heard from the provider (or me) within 30 minutes, call again. * Telemedicine may be your best choice for care during this COVID-19 outbreak. * You should call a telemedicine provider now, if your own healthcare provider is not available. ALTERNATE DISPOSITION - CALL TELEMEDICINE PROVIDER NOW: CARE ADVICE given per CORONAVIRUS (COVID-19) - DIAGNOSED OR SUSPECTED (Adult) guideline. * You become worse. * Difficulty breathing occurs * Chest pain increases or becomes constant. CALL BACK IF: After Care Instructions Given Call Event Type User Date / Time Description Education document email  Sondra Come 04/29/2018 4:14:51 PM [place] Now  Instructions Paging DoctorName Phone DateTime Result/Outcome Message Type Notes Abelino Derrick- MD [phone] 04/29/2018 4:10:19 PM Called On Call Provider - Reached Doctor Paged Abelino Derrick- MD 04/29/2018 4:13:10 PM Spoke with On Call - General Message Result oncall states to instruct pt to try telemedicine option, if not able to needs to go to ER for evaluation.

## 2018-05-01 NOTE — Telephone Encounter (Signed)
[name] (DPR signed) said pt has taken breathing treatments and [name] thinks COPD is bothering her. [name] has not taken temp this morning. Over weekend temp did not get over 99.8.non prod cough on 04/28/18; no cough on 04/30/18; SOB usual with COPD symptom. No travel; no exposure to known covid or flu.[name] scheduled Doxy.me 05/01/18 at 11:20.

## 2018-05-01 NOTE — Telephone Encounter (Signed)
Noted and will evaluate.  

## 2018-05-22 ENCOUNTER — Ambulatory Visit (INDEPENDENT_AMBULATORY_CARE_PROVIDER_SITE_OTHER): Payer: Medicare Other | Admitting: Primary Care

## 2018-05-22 ENCOUNTER — Encounter: Payer: Self-pay | Admitting: Primary Care

## 2018-05-22 ENCOUNTER — Ambulatory Visit: Payer: Medicare Other | Admitting: Primary Care

## 2018-05-22 DIAGNOSIS — F329 Major depressive disorder, single episode, unspecified: Secondary | ICD-10-CM | POA: Diagnosis not present

## 2018-05-22 DIAGNOSIS — F419 Anxiety disorder, unspecified: Secondary | ICD-10-CM

## 2018-05-22 DIAGNOSIS — F32A Depression, unspecified: Secondary | ICD-10-CM

## 2018-05-22 NOTE — Assessment & Plan Note (Signed)
Endorses no improvement since switching to Zoloft.  Given that she didn't feel that Lexapro was helpful we will increase her current dose of Zoloft to see if that makes any difference.   If no improvement then consider SNRI. She will update in 3-4 weeks.

## 2018-05-22 NOTE — Patient Instructions (Signed)
We've increased the dose of your Zoloft to 50 mg. You may take two of your 25 mg tablets to equal 50 mg.  Please update me in 3-4 weeks as discussed.  It was a pleasure to see you today!

## 2018-05-22 NOTE — Progress Notes (Signed)
Subjective:    Patient ID: [name], female    DOB: 01/27/1928, 85 y.o.   MRN: [id number]  HPI  Virtual Visit via Video Note  I connected with [name] on 05/22/18 at  2:00 PM EDT by a video enabled telemedicine application and verified that I am speaking with the correct person using two identifiers.  Location: Patient: Home Provider: Office   I discussed the limitations of evaluation and management by telemedicine and the availability of in person appointments. The patient expressed understanding and agreed to proceed.  History of Present Illness:  [name] is an 85 year old female who presents today for follow up of anxiety.   She was last evaluated with her daughter in the office on 03/21/18 for follow up and noted that her Lexapro didn't seem effective any longer for anxiety symptoms despite dose increases. GAD 7 score of 14 so her regimen was changed to Zoloft 25 mg.   Since her last visit she doesn't feel as though Zoloft has helped. She endorses that she feels "shakey on the inside", also with worry and feeling nervous. She is compliant to Zoloft 25 mg daily. Denies SI/HI, falls, sleep disturbance.    Observations/Objective:  Alert and oriented. Appears well, not sickly. No distress. Speaking in complete sentences.   Assessment and Plan:  See problem based charting.  Follow Up Instructions:  We've increased the dose of your Zoloft to 50 mg. You may take two of your 25 mg tablets to equal 50 mg.  Please update me in 3-4 weeks as discussed.  It was a pleasure to see you today!    I discussed the assessment and treatment plan with the patient. The patient was provided an opportunity to ask questions and all were answered. The patient agreed with the plan and demonstrated an understanding of the instructions.   The patient was advised to call back or seek an in-person evaluation if the symptoms worsen or if the condition fails to improve as  anticipated.    Pleas Koch, NP    Review of Systems  Respiratory: Negative for shortness of breath.   Cardiovascular: Negative for chest pain.  Neurological: Negative for dizziness.  Psychiatric/Behavioral: The patient is nervous/anxious.        See HPI       Past Medical History:  Diagnosis Date  . Anxiety   . Arthritis    "hands" (10/24/2012)  . B12 deficiency    "get shots twice/month" (10/24/2012)  . Bursitis   . Depression   . Diverticulitis   . Emphysema dx 2012  . Exertional shortness of breath   . Headache(784.0)    "weekly at least" (10/24/2012)  . Hypothyroidism   . Insomnia   . Ovarian cancer ([place])    mass hooked to sm. intestine/bladder  . Skin cancer of face   . Syncope    when neck is up or turned to side  . Syncope and collapse    "first time I ever I collapsed"; confirms h/o presyncopal events (10/24/2012)  . UTI (lower urinary tract infection)      Social History   Socioeconomic History  . Marital status: Widowed    Spouse name: Not on file  . Number of children: Not on file  . Years of education: Not on file  . Highest education level: Not on file  Occupational History  . Not on file  Social Needs  . Financial resource strain: Not on file  .  Food insecurity:    Worry: Not on file    Inability: Not on file  . Transportation needs:    Medical: Not on file    Non-medical: Not on file  Tobacco Use  . Smoking status: Never Smoker  . Smokeless tobacco: Never Used  Substance and Sexual Activity  . Alcohol use: No  . Drug use: No  . Sexual activity: Never  Lifestyle  . Physical activity:    Days per week: Not on file    Minutes per session: Not on file  . Stress: Not on file  Relationships  . Social connections:    Talks on phone: Not on file    Gets together: Not on file    Attends religious service: Not on file    Active member of club or organization: Not on file    Attends meetings of clubs or organizations: Not on  file    Relationship status: Not on file  . Intimate partner violence:    Fear of current or ex partner: Not on file    Emotionally abused: Not on file    Physically abused: Not on file    Forced sexual activity: Not on file  Other Topics Concern  . Not on file  Social History Narrative  . Not on file    Past Surgical History:  Procedure Laterality Date  . ABDOMINAL HYSTERECTOMY    . CATARACT EXTRACTION W/ INTRAOCULAR LENS  IMPLANT, BILATERAL Bilateral 2000's  . DILATION AND CURETTAGE OF UTERUS     several  . FOOT SURGERY Right 1943-1944   4 surgeries  . IR KYPHO THORACIC WITH BONE BIOPSY  07/27/2017  . THYROIDECTOMY    . TONSILLECTOMY      No family history on file.  Allergies  Allergen Reactions  . Penicillins Hives and Rash    Has patient had a PCN reaction causing immediate rash, facial/tongue/throat swelling, SOB or lightheadedness with hypotension: Yes Has patient had a PCN reaction causing severe rash involving mucus membranes or skin necrosis: Yes Has patient had a PCN reaction that required hospitalization No Has patient had a PCN reaction occurring within the last 10 years: No If all of the above answers are "NO", then may proceed with Cephalosporin use.   . Prednisone Swelling    Current Outpatient Medications on File Prior to Visit  Medication Sig Dispense Refill  . acetaminophen (TYLENOL) 650 MG CR tablet Take 650 mg by mouth every 8 (eight) hours as needed for pain.     Marland Kitchen albuterol (PROVENTIL) (2.5 MG/3ML) 0.083% nebulizer solution Take 3 mLs (2.5 mg total) by nebulization every 6 (six) hours as needed for wheezing or shortness of breath. 150 mL 0  . alendronate (FOSAMAX) 70 MG tablet Take 70 mg by mouth once a week. Take with a full glass of water on an empty stomach. Mondays    . Cholecalciferol (VITAMIN D) 2000 units tablet Take 2,000 Units by mouth daily.    . Fluticasone-Salmeterol (ADVAIR DISKUS) 250-50 MCG/DOSE AEPB Inhale 1 puff into the lungs 2  (two) times daily. 1 each 5  . levothyroxine (SYNTHROID, LEVOTHROID) 100 MCG tablet Take 1 tablet by mouth every morning on an empty stomach with water. No food or other medications for 30 minutes. 90 tablet 3  . sertraline (ZOLOFT) 25 MG tablet Take 1 tablet (25 mg total) by mouth daily. For anxiety and depression. 90 tablet 0  . vitamin B-12 (CYANOCOBALAMIN) 1000 MCG tablet Take 1,000 mcg by mouth daily.  No current facility-administered medications on file prior to visit.     BP 109/62   Pulse 83   Temp 97.6 F (36.4 C)    Objective:   Physical Exam  Constitutional: She is oriented to person, place, and time. She appears well-nourished.  Respiratory: Effort normal.  Neurological: She is alert and oriented to person, place, and time.  Psychiatric: She has a normal mood and affect.           Assessment & Plan:

## 2018-06-09 ENCOUNTER — Telehealth: Payer: Self-pay | Admitting: Primary Care

## 2018-06-09 NOTE — Telephone Encounter (Signed)
Placed in [place] box  ?

## 2018-06-09 NOTE — Telephone Encounter (Signed)
[employer] form brought in to be filled out by Allie Bossier. Placed in Rx tower for [employer]. Please call when ready to pick up. [phone]

## 2018-06-12 NOTE — Telephone Encounter (Signed)
Completed and placed in Chan's inbox last week.

## 2018-06-19 ENCOUNTER — Other Ambulatory Visit: Payer: Self-pay | Admitting: Primary Care

## 2018-06-19 DIAGNOSIS — F329 Major depressive disorder, single episode, unspecified: Secondary | ICD-10-CM

## 2018-06-19 DIAGNOSIS — F419 Anxiety disorder, unspecified: Secondary | ICD-10-CM

## 2018-06-19 DIAGNOSIS — F32A Depression, unspecified: Secondary | ICD-10-CM

## 2018-06-19 NOTE — Telephone Encounter (Signed)
How is she doing on the increased dose of Zoloft? Is she taking two pills? Any improvement?

## 2018-06-19 NOTE — Telephone Encounter (Signed)
Last prescribed on 03/21/2018. Last appointment on 05/22/2018. Next future appointment on 03/27/2018

## 2018-06-21 MED ORDER — SERTRALINE HCL 50 MG PO TABS: 50.0000 mg | ORAL_TABLET | Freq: Every day | ORAL | 1 refills | Status: DC

## 2018-06-21 NOTE — Telephone Encounter (Signed)
Spoken to patient's daughter. She stated that patient better than before. However, she has her goods and bad days. Yes, patient is taking 50 mg. Patient's daughter asked if we can send the 50 mg for patient.

## 2018-06-21 NOTE — Telephone Encounter (Signed)
Noted and glad to hear that she's better. Will send in the 50 mg dose of sertraline.

## 2018-08-16 ENCOUNTER — Telehealth: Payer: Self-pay | Admitting: Primary Care

## 2018-08-16 NOTE — Telephone Encounter (Signed)
Completed and handed to [place].

## 2018-08-16 NOTE — Telephone Encounter (Signed)
Patient's Daughter Sunday Spillers called and would like to see if you are able to write a prescription for the patient to get a small transport wheelchair   C/B # [phone]

## 2018-08-16 NOTE — Telephone Encounter (Signed)
Spoken to patient's daughter and notified that Rx is ready for pick up. Left in the front office.

## 2018-08-30 ENCOUNTER — Encounter: Payer: Self-pay | Admitting: Primary Care

## 2018-08-30 ENCOUNTER — Ambulatory Visit (INDEPENDENT_AMBULATORY_CARE_PROVIDER_SITE_OTHER): Payer: Medicare Other | Admitting: Primary Care

## 2018-08-30 ENCOUNTER — Other Ambulatory Visit: Payer: Medicare Other

## 2018-08-30 VITALS — Wt 100.0 lb

## 2018-08-30 DIAGNOSIS — R350 Frequency of micturition: Secondary | ICD-10-CM | POA: Insufficient documentation

## 2018-08-30 LAB — POC URINALSYSI DIPSTICK (AUTOMATED)
Bilirubin, UA: NEGATIVE
Blood, UA: NEGATIVE
Glucose, UA: NEGATIVE
Ketones, UA: NEGATIVE
Leukocytes, UA: NEGATIVE
Nitrite, UA: NEGATIVE
Protein, UA: POSITIVE — AB
Spec Grav, UA: 1.015 (ref 1.010–1.025)
Urobilinogen, UA: 0.2 E.U./dL
pH, UA: 6.5 (ref 5.0–8.0)

## 2018-08-30 NOTE — Assessment & Plan Note (Signed)
Acute for the last 24 hours mostly during the night.  No other symptoms.  UA today grossly negative. Culture sent.  Discussed to limit caffeine and liquids prior to bedtime. Await culture results. She will update with any changes.

## 2018-08-30 NOTE — Progress Notes (Signed)
Subjective:    Patient ID: [name], female    DOB: Jul 19, 1928, 85 y.o.   MRN: [id number]  HPI  Virtual Visit via Video Note  I connected with [name] on 08/30/18 at  2:00 PM EDT by a video enabled telemedicine application and verified that I am speaking with the correct person using two identifiers.  Location: Patient: Home Provider: Office   I discussed the limitations of evaluation and management by telemedicine and the availability of in person appointments. The patient expressed understanding and agreed to proceed.  History of Present Illness:  Ms. Spielberg is an 85 year old female with a history of urinary incontinence, generalized weakness, COPD, hypothyroidism who presents today with a chief complaint of urinary frequency.  Symptoms began last night. Last night she was up about five times during the night to urinate which is not her norm. She does drink caffeine throughout the day, also liquids prior to bedtime. She denies abdominal pain, hematuria, dysuria, nausea, acute mental status changes (per daughter).    Observations/Objective:  Alert and oriented. Appears well, not sickly. No distress. Speaking in complete sentences.   Assessment and Plan:Marland Kitchen  Acute for the last 24 hours mostly during the night.  No other symptoms.  UA today grossly negative. Culture sent.  Discussed to limit caffeine and liquids prior to bedtime. Await culture results. She will update with any changes.  Follow Up Instructions:  We will be in touch Friday with the urine culture results.  Limit caffeine after 12 pm during the day. Limit liquids 1 hour prior to bedtime.  Please update me if anything changes.  It was a pleasure to see you today! Allie Bossier, NP-C    I discussed the assessment and treatment plan with the patient. The patient was provided an opportunity to ask questions and all were answered. The patient agreed with the plan and demonstrated an understanding  of the instructions.   The patient was advised to call back or seek an in-person evaluation if the symptoms worsen or if the condition fails to improve as anticipated.    Pleas Koch, NP    Review of Systems  Constitutional: Negative for fever.  Gastrointestinal: Negative for abdominal pain.  Genitourinary: Positive for frequency. Negative for dysuria, hematuria and urgency.  Neurological: Negative for dizziness.       Past Medical History:  Diagnosis Date  . Anxiety   . Arthritis    "hands" (10/24/2012)  . B12 deficiency    "get shots twice/month" (10/24/2012)  . Bursitis   . Depression   . Diverticulitis   . Emphysema dx 2012  . Exertional shortness of breath   . Headache(784.0)    "weekly at least" (10/24/2012)  . Hypothyroidism   . Insomnia   . Ovarian cancer ([place])    mass hooked to sm. intestine/bladder  . Skin cancer of face   . Syncope    when neck is up or turned to side  . Syncope and collapse    "first time I ever I collapsed"; confirms h/o presyncopal events (10/24/2012)  . UTI (lower urinary tract infection)      Social History   Socioeconomic History  . Marital status: Widowed    Spouse name: Not on file  . Number of children: Not on file  . Years of education: Not on file  . Highest education level: Not on file  Occupational History  . Not on file  Social Needs  . Emergency planning/management officer  strain: Not on file  . Food insecurity    Worry: Not on file    Inability: Not on file  . Transportation needs    Medical: Not on file    Non-medical: Not on file  Tobacco Use  . Smoking status: Never Smoker  . Smokeless tobacco: Never Used  Substance and Sexual Activity  . Alcohol use: No  . Drug use: No  . Sexual activity: Never  Lifestyle  . Physical activity    Days per week: Not on file    Minutes per session: Not on file  . Stress: Not on file  Relationships  . Social Herbalist on phone: Not on file    Gets together: Not on  file    Attends religious service: Not on file    Active member of club or organization: Not on file    Attends meetings of clubs or organizations: Not on file    Relationship status: Not on file  . Intimate partner violence    Fear of current or ex partner: Not on file    Emotionally abused: Not on file    Physically abused: Not on file    Forced sexual activity: Not on file  Other Topics Concern  . Not on file  Social History Narrative  . Not on file    Past Surgical History:  Procedure Laterality Date  . ABDOMINAL HYSTERECTOMY    . CATARACT EXTRACTION W/ INTRAOCULAR LENS  IMPLANT, BILATERAL Bilateral 2000's  . DILATION AND CURETTAGE OF UTERUS     several  . FOOT SURGERY Right 1943-1944   4 surgeries  . IR KYPHO THORACIC WITH BONE BIOPSY  07/27/2017  . THYROIDECTOMY    . TONSILLECTOMY      No family history on file.  Allergies  Allergen Reactions  . Penicillins Hives and Rash    Has patient had a PCN reaction causing immediate rash, facial/tongue/throat swelling, SOB or lightheadedness with hypotension: Yes Has patient had a PCN reaction causing severe rash involving mucus membranes or skin necrosis: Yes Has patient had a PCN reaction that required hospitalization No Has patient had a PCN reaction occurring within the last 10 years: No If all of the above answers are "NO", then may proceed with Cephalosporin use.   . Prednisone Swelling    Current Outpatient Medications on File Prior to Visit  Medication Sig Dispense Refill  . acetaminophen (TYLENOL) 650 MG CR tablet Take 650 mg by mouth every 8 (eight) hours as needed for pain.     Marland Kitchen albuterol (PROVENTIL) (2.5 MG/3ML) 0.083% nebulizer solution Take 3 mLs (2.5 mg total) by nebulization every 6 (six) hours as needed for wheezing or shortness of breath. 150 mL 0  . alendronate (FOSAMAX) 70 MG tablet Take 70 mg by mouth once a week. Take with a full glass of water on an empty stomach. Mondays    . Cholecalciferol  (VITAMIN D) 2000 units tablet Take 2,000 Units by mouth daily.    . Fluticasone-Salmeterol (ADVAIR DISKUS) 250-50 MCG/DOSE AEPB Inhale 1 puff into the lungs 2 (two) times daily. 1 each 5  . levothyroxine (SYNTHROID, LEVOTHROID) 100 MCG tablet Take 1 tablet by mouth every morning on an empty stomach with water. No food or other medications for 30 minutes. 90 tablet 3  . sertraline (ZOLOFT) 50 MG tablet Take 1 tablet (50 mg total) by mouth daily. For anxiety and depression. 90 tablet 1  . vitamin B-12 (CYANOCOBALAMIN) 1000 MCG tablet Take  1,000 mcg by mouth daily.     No current facility-administered medications on file prior to visit.     Wt 100 lb (45.4 kg)   BMI 18.59 kg/m    Objective:   Physical Exam  Constitutional: She is oriented to person, place, and time. She appears well-nourished. She does not appear ill.  Respiratory: Effort normal.  Neurological: She is alert and oriented to person, place, and time.  Psychiatric: She has a normal mood and affect.           Assessment & Plan:

## 2018-08-30 NOTE — Patient Instructions (Signed)
We will be in touch Friday with the urine culture results.  Limit caffeine after 12 pm during the day. Limit liquids 1 hour prior to bedtime.  Please update me if anything changes.  It was a pleasure to see you today! Allie Bossier, NP-C

## 2018-08-31 LAB — URINE CULTURE
MICRO NUMBER:: 788700
SPECIMEN QUALITY:: ADEQUATE

## 2018-09-06 ENCOUNTER — Other Ambulatory Visit: Payer: Self-pay

## 2018-09-06 ENCOUNTER — Encounter: Payer: Self-pay | Admitting: Emergency Medicine

## 2018-09-06 ENCOUNTER — Emergency Department
Admission: EM | Admit: 2018-09-06 | Discharge: 2018-09-06 | Disposition: A | Discharge status: Home / Self Care | Payer: Medicare Other | Attending: Emergency Medicine | Admitting: Emergency Medicine

## 2018-09-06 ENCOUNTER — Telehealth: Payer: Self-pay

## 2018-09-06 ENCOUNTER — Emergency Department: Payer: Medicare Other

## 2018-09-06 DIAGNOSIS — R531 Weakness: Secondary | ICD-10-CM | POA: Diagnosis not present

## 2018-09-06 DIAGNOSIS — Z8543 Personal history of malignant neoplasm of ovary: Secondary | ICD-10-CM | POA: Diagnosis not present

## 2018-09-06 DIAGNOSIS — R0602 Shortness of breath: Secondary | ICD-10-CM | POA: Diagnosis not present

## 2018-09-06 DIAGNOSIS — R296 Repeated falls: Secondary | ICD-10-CM | POA: Diagnosis not present

## 2018-09-06 DIAGNOSIS — E039 Hypothyroidism, unspecified: Secondary | ICD-10-CM | POA: Insufficient documentation

## 2018-09-06 DIAGNOSIS — G4700 Insomnia, unspecified: Secondary | ICD-10-CM | POA: Insufficient documentation

## 2018-09-06 LAB — BASIC METABOLIC PANEL
Anion gap: 12 (ref 5–15)
BUN: 17 mg/dL (ref 8–23)
CO2: 25 mmol/L (ref 22–32)
Calcium: 9.6 mg/dL (ref 8.9–10.3)
Chloride: 99 mmol/L (ref 98–111)
Creatinine, Ser: 0.88 mg/dL (ref 0.44–1.00)
GFR calc Af Amer: >60 mL/min (ref >60)
GFR calc non Af Amer: 59 mL/min — ABNORMAL LOW (ref >60)
Glucose, Bld: 101 mg/dL — ABNORMAL HIGH (ref 70–99)
Potassium: 3.3 mmol/L — ABNORMAL LOW (ref 3.5–5.1)
Sodium: 136 mmol/L (ref 135–145)

## 2018-09-06 LAB — URINALYSIS, COMPLETE (UACMP) WITH MICROSCOPIC
Bacteria, UA: NONE SEEN
Bilirubin Urine: NEGATIVE
Glucose, UA: NEGATIVE mg/dL
Hgb urine dipstick: NEGATIVE
Ketones, ur: 5 mg/dL — AB
Leukocytes,Ua: NEGATIVE
Nitrite: NEGATIVE
Protein, ur: 100 mg/dL — AB
Specific Gravity, Urine: 1.017 (ref 1.005–1.030)
Squamous Epithelial / HPF: NONE SEEN (ref 0–5)
pH: 6 (ref 5.0–8.0)

## 2018-09-06 LAB — CBC
HCT: 34.7 % — ABNORMAL LOW (ref 36.0–46.0)
Hemoglobin: 11.6 g/dL — ABNORMAL LOW (ref 12.0–15.0)
MCH: 30.1 pg (ref 26.0–34.0)
MCHC: 33.4 g/dL (ref 30.0–36.0)
MCV: 89.9 fL (ref 80.0–100.0)
Platelets: 208 10*3/uL (ref 150–400)
RBC: 3.86 MIL/uL — ABNORMAL LOW (ref 3.87–5.11)
RDW: 13.3 % (ref 11.5–15.5)
WBC: 5.8 10*3/uL (ref 4.0–10.5)
nRBC: 0 % (ref 0.0–0.2)

## 2018-09-06 MED ORDER — SODIUM CHLORIDE 0.9% FLUSH: 3.0000 mL | Freq: Once | INTRAVENOUS | Status: DC

## 2018-09-06 MED ORDER — TRAZODONE HCL 50 MG PO TABS: 25.0000 mg | ORAL_TABLET | Freq: Every day | ORAL | 0 refills | Status: DC

## 2018-09-06 MED ORDER — TRAZODONE HCL 50 MG PO TABS
25.0000 mg | ORAL_TABLET | Freq: Every day | ORAL | Status: DC
  Administered 2018-09-06: 25 mg via ORAL
  Filled 2018-09-06: qty 1

## 2018-09-06 NOTE — ED Triage Notes (Signed)
PT to ER from [facility] with c/o weakness and falling frequently.  Pt denies sickness, states that today is the "worst I have had."

## 2018-09-06 NOTE — ED Notes (Signed)

## 2018-09-06 NOTE — ED Provider Notes (Signed)
[facility] Emergency Department Provider Note  Time seen: 5:16 PM  I have reviewed the triage vital signs and the nursing notes.   HISTORY  Chief Complaint Weakness   HPI [name] is a 85 y.o. female with a past medical history of anxiety, depression, hypothyroidism, syncope, UTIs, presents to the emergency department for generalized weakness.  According to the patient she has a long history of weakness and falls but states that weakness is worsened over the past several days and she is had increased falls.  Denies hitting her head that she can recall.  Denies LOC.  Denies any acute pains.  Patient describes her weakness as generalized as if her legs give out from under her.  Denies any unilateral weakness denies any focal weakness or numbness of any arm or leg.  Denies any headache.  Denies any recent fever cough congestion or new shortness of breath although the patient does states she will get short of breath from time to time which is chronic.   Past Medical History:  Diagnosis Date  . Anxiety   . Arthritis    "hands" (10/24/2012)  . B12 deficiency    "get shots twice/month" (10/24/2012)  . Bursitis   . Depression   . Diverticulitis   . Emphysema dx 2012  . Exertional shortness of breath   . Headache(784.0)    "weekly at least" (10/24/2012)  . Hypothyroidism   . Insomnia   . Ovarian cancer ([place])    mass hooked to sm. intestine/bladder  . Skin cancer of face   . Syncope    when neck is up or turned to side  . Syncope and collapse    "first time I ever I collapsed"; confirms h/o presyncopal events (10/24/2012)  . UTI (lower urinary tract infection)     Patient Active Problem List   Diagnosis Date Noted  . Urinary frequency 08/30/2018  . Cough 05/01/2018  . Urinary incontinence 03/21/2018  . Irritation of external ear canal 09/28/2017  . Compression fracture of body of thoracic vertebra ([place]) 09/14/2017  . Anxiety and depression  09/14/2017  . Syncope and collapse 10/24/2012  . Orthostatic hypotension 10/24/2012  . Weakness generalized 01/21/2011  . Vertigo 01/21/2011  . COPD (chronic obstructive pulmonary disease) ([place]) 01/21/2011  . Hypothyroidism 03/28/2008  . DYSPNEA 03/28/2008    Past Surgical History:  Procedure Laterality Date  . ABDOMINAL HYSTERECTOMY    . CATARACT EXTRACTION W/ INTRAOCULAR LENS  IMPLANT, BILATERAL Bilateral 2000's  . DILATION AND CURETTAGE OF UTERUS     several  . FOOT SURGERY Right 1943-1944   4 surgeries  . IR KYPHO THORACIC WITH BONE BIOPSY  07/27/2017  . THYROIDECTOMY    . TONSILLECTOMY      Prior to Admission medications   Medication Sig Start Date End Date Taking? Authorizing Provider  acetaminophen (TYLENOL) 650 MG CR tablet Take 650 mg by mouth every 8 (eight) hours as needed for pain.     [provider]  albuterol (PROVENTIL) (2.5 MG/3ML) 0.083% nebulizer solution Take 3 mLs (2.5 mg total) by nebulization every 6 (six) hours as needed for wheezing or shortness of breath. 11/08/17   Pleas Koch, NP  alendronate (FOSAMAX) 70 MG tablet Take 70 mg by mouth once a week. Take with a full glass of water on an empty stomach. Mondays    [provider]  Cholecalciferol (VITAMIN D) 2000 units tablet Take 2,000 Units by mouth daily.    [provider]  Fluticasone-Salmeterol (ADVAIR DISKUS) 250-50 MCG/DOSE AEPB Inhale 1 puff into the lungs 2 (two) times daily. 11/08/17   Pleas Koch, NP  levothyroxine (SYNTHROID, LEVOTHROID) 100 MCG tablet Take 1 tablet by mouth every morning on an empty stomach with water. No food or other medications for 30 minutes. 10/12/17   Pleas Koch, NP  sertraline (ZOLOFT) 50 MG tablet Take 1 tablet (50 mg total) by mouth daily. For anxiety and depression. 06/21/18   Pleas Koch, NP  vitamin B-12 (CYANOCOBALAMIN) 1000 MCG tablet Take 1,000 mcg by mouth daily.    [provider]    Allergies   Allergen Reactions  . Penicillins Hives and Rash    Has patient had a PCN reaction causing immediate rash, facial/tongue/throat swelling, SOB or lightheadedness with hypotension: Yes Has patient had a PCN reaction causing severe rash involving mucus membranes or skin necrosis: Yes Has patient had a PCN reaction that required hospitalization No Has patient had a PCN reaction occurring within the last 10 years: No If all of the above answers are "NO", then may proceed with Cephalosporin use.   . Prednisone Swelling    History reviewed. No pertinent family history.  Social History Social History   Tobacco Use  . Smoking status: Never Smoker  . Smokeless tobacco: Never Used  Substance Use Topics  . Alcohol use: No  . Drug use: No    Review of Systems Constitutional: Negative for fever. ENT: Negative for recent illness/congestion Cardiovascular: Negative for chest pain. Respiratory: Mild chronic shortness of breath.  No worsening or new shortness of breath. Gastrointestinal: Negative for abdominal pain, vomiting and diarrhea. Genitourinary: Negative for urinary compaints Musculoskeletal: Negative for musculoskeletal complaints Skin: Negative for skin complaints  Neurological: Negative for headache All other ROS negative  ____________________________________________   PHYSICAL EXAM:  VITAL SIGNS: ED Triage Vitals [09/06/18 1615]  Enc Vitals Group     BP (!) 160/86     Pulse Rate 82     Resp 18     Temp 99.3 F (37.4 C)     Temp Source Oral     SpO2 98 %     Weight 100 lb (45.4 kg)     Height 5\' 2"  (1.575 m)     Head Circumference      Peak Flow      Pain Score 0     Pain Loc      Pain Edu?      Excl. in [place]?     Constitutional: Alert and oriented. Well appearing and in no distress. Eyes: Normal exam ENT      Head: Normocephalic and atraumatic.      Mouth/Throat: Mucous membranes are moist. Cardiovascular: Normal rate, regular rhythm. No  murmur Respiratory: Normal respiratory effort without tachypnea nor retractions. Breath sounds are clear Gastrointestinal: Soft and nontender. No distention.  Musculoskeletal: Nontender with normal range of motion in all extremities. Neurologic:  Normal speech and language. No gross focal neurologic deficits Skin:  Skin is warm, dry and intact.  Psychiatric: Mood and affect are normal.  ____________________________________________    EKG  EKG viewed and interpreted by myself shows a normal sinus rhythm 87 bpm with a narrow QRS, normal axis, normal intervals, no concerning ST changes.  ____________________________________________    RADIOLOGY  CT head negative for acute abnormality.  ____________________________________________   INITIAL IMPRESSION / ASSESSMENT AND PLAN / ED COURSE  Pertinent labs & imaging results that were available during my care of the patient were  reviewed by me and considered in my medical decision making (see chart for details).   Patient presents to the emergency department for generalized weakness and fatigue.  Differential would include electrolyte or metabolic abnormality, dehydration, urinary tract infection, ICH/CVA.  We will check labs, urinalysis obtain CT imaging the head and continue to closely monitor.  Patient agreeable to plan of care.  Patient's work-up is overall reassuring.  CT scan of the head is negative.  Lab work is largely within normal limits.  Very slight dehydration on urinalysis.  Sister is here with the patient thinks most of her weakness comes from her not sleeping at night.  She states in the days that she is able to sleep she does very well and for instance last night she did not sleep at all and she had a bad day today feeling anxious and weak.  I believe this is reasonable to prescribe a very light sleep aid for the patient.  Sister will be staying with the patient.  Patient has ambulated in the emergency department without issue  using a cane.  Patient has a walker as well as a wheelchair at home.  [name] was evaluated in Emergency Department on 09/06/2018 for the symptoms described in the history of present illness. She was evaluated in the context of the global COVID-19 pandemic, which necessitated consideration that the patient might be at risk for infection with the SARS-CoV-2 virus that causes COVID-19. Institutional protocols and algorithms that pertain to the evaluation of patients at risk for COVID-19 are in a state of rapid change based on information released by regulatory bodies including the CDC and federal and state organizations. These policies and algorithms were followed during the patient's care in the ED.  ____________________________________________   FINAL CLINICAL IMPRESSION(S) / ED DIAGNOSES  Fall Weakness   Harvest Dark, MD 09/06/18 2024

## 2018-09-06 NOTE — Telephone Encounter (Signed)
Pt has generalized weakness on and off for 3-4 months but today is worse; pt does not feel like moving around in her home. No weakness in arms or legs especially; weakness is generalized all her body. Pt is SOB,shaky,lightheaded on and off, chilly, dry cough, watery diarrhea last few days.No abd pain and not sure if dry mouth or last time pt urinated. No travel and no known exposure to + covid. Sunday Spillers pts daughter (DPR signed) thinks pt might be dehydrated. Sunday Spillers is going to moms home when gets off phone. Sunday Spillers will take pt to an ED when she gets to her moms home (not sure yet which ED will go to, probably either [facility] or [facility]). FYI to Gentry Fitz NP.

## 2018-09-06 NOTE — Telephone Encounter (Signed)
Noted. Patient at [facility] ED now and is under going labs and testing.

## 2018-09-06 NOTE — ED Notes (Signed)
First Nurse Note; Pt sent over from [facility] due to generalized weakness.  Pt alert upon arrival, NAD noted at this time.

## 2018-09-06 NOTE — ED Notes (Signed)
Patient transported to CT 

## 2018-09-08 ENCOUNTER — Telehealth: Payer: Self-pay

## 2018-09-08 NOTE — Telephone Encounter (Signed)
noted 

## 2018-09-08 NOTE — Telephone Encounter (Signed)
Left message for patient to call back to schedule.  °

## 2018-09-08 NOTE — Telephone Encounter (Signed)
Pt's daughter called back and scheduled MyChart visit with Anda Kraft on 09/11/18.

## 2018-09-08 NOTE — Telephone Encounter (Signed)
Virtual follow-up is fine.  Yes, okay to schedule with another provider.

## 2018-09-08 NOTE — Telephone Encounter (Signed)
Does patient need a virtual or in office ER follow up? Ok to do with another provider since you will not be here?

## 2018-09-11 ENCOUNTER — Encounter: Payer: Self-pay | Admitting: Primary Care

## 2018-09-11 ENCOUNTER — Telehealth (INDEPENDENT_AMBULATORY_CARE_PROVIDER_SITE_OTHER): Payer: Medicare Other | Admitting: Primary Care

## 2018-09-11 DIAGNOSIS — G4700 Insomnia, unspecified: Secondary | ICD-10-CM | POA: Diagnosis not present

## 2018-09-11 DIAGNOSIS — R531 Weakness: Secondary | ICD-10-CM

## 2018-09-11 NOTE — Progress Notes (Addendum)
Subjective:    Patient ID: [name], female    DOB: 1928-02-28, 85 y.o.   MRN: [id number]  HPI  Virtual Visit via Video Note  I connected with [name] on 09/11/18 at  3:00 PM EDT by a video enabled telemedicine application and verified that I am speaking with the correct person using two identifiers.  Location: Patient: Home Provider: Office   I discussed the limitations of evaluation and management by telemedicine and the availability of in person appointments. The patient expressed understanding and agreed to proceed.  History of Present Illness:  [name] is an 85 year old female with a history of syncope, orthostatic hypotension, COPD, hypothyroidism, generalized weakness, anxiety and depression, UTI who presents today for ED follow up.  She presented to [facility] ED on 09/06/18 with a chief complaint of increased weakness/fatigue over the last several days, also with a few falls without hitting her head and loss of consciousness.   During her stay in the ED she underwent lab work which was overall without concern, UA which showed evidence of dehydration, and CT head which was negative. Her family with her believed that a lot of her symptoms come from not sleeping well at night. Given her grossly normal labs and CT scan she was prescribed Trazodone 50 mg and told her to follow up with PCP.  Since her discharge home she's sleeping much better. She is sleeping 6-8 hours per night. She is taking the full trazodone 50 mg tablet rather than one half.  She continues to feel chronically weak/tired but her daughter feels that she has improved after getting better sleep.  She denies falls, dizziness, rectal bleeding, abdominal pain.  She and her daughter are requesting that we continue the trazodone prescription as prescribed.  [name] daughter [name] is requesting a transport wheelchair for her mother.  A regular wheelchair will not suffice as it is too heavy and bulky.   The patient cannot propel herself in a regular wheelchair, but a transport wheelchair will be much lighter, more transferable so that the patient may go out into the community, and easier for the patient to propel herself. Her daughter is consistently with her at all times.   Observations/Objective:  Alert and oriented. Appears well, not sickly. No distress. Speaking in complete sentences.   Assessment and Plan:  See problem based charting.  Follow Up Instructions:  Continue trazodone 50 mg at night for sleep. Please send me a MyChart message when you need refills.  Try to get up every hour to stay active and increased energy levels.  It was a pleasure to see you today!    I discussed the assessment and treatment plan with the patient. The patient was provided an opportunity to ask questions and all were answered. The patient agreed with the plan and demonstrated an understanding of the instructions.   The patient was advised to call back or seek an in-person evaluation if the symptoms worsen or if the condition fails to improve as anticipated.     Pleas Koch, NP    Review of Systems  Constitutional: Positive for fatigue. Negative for diaphoresis.  Respiratory: Negative for shortness of breath.   Cardiovascular: Negative for chest pain.  Neurological: Positive for weakness. Negative for dizziness.  Psychiatric/Behavioral: Negative for sleep disturbance.       Past Medical History:  Diagnosis Date  . Anxiety   . Arthritis    "hands" (10/24/2012)  . B12 deficiency    "  get shots twice/month" (10/24/2012)  . Bursitis   . Depression   . Diverticulitis   . Emphysema dx 2012  . Exertional shortness of breath   . Headache(784.0)    "weekly at least" (10/24/2012)  . Hypothyroidism   . Insomnia   . Ovarian cancer ([place])    mass hooked to sm. intestine/bladder  . Skin cancer of face   . Syncope    when neck is up or turned to side  . Syncope and collapse     "first time I ever I collapsed"; confirms h/o presyncopal events (10/24/2012)  . UTI (lower urinary tract infection)      Social History   Socioeconomic History  . Marital status: Widowed    Spouse name: Not on file  . Number of children: Not on file  . Years of education: Not on file  . Highest education level: Not on file  Occupational History  . Not on file  Social Needs  . Financial resource strain: Not on file  . Food insecurity    Worry: Not on file    Inability: Not on file  . Transportation needs    Medical: Not on file    Non-medical: Not on file  Tobacco Use  . Smoking status: Never Smoker  . Smokeless tobacco: Never Used  Substance and Sexual Activity  . Alcohol use: No  . Drug use: No  . Sexual activity: Never  Lifestyle  . Physical activity    Days per week: Not on file    Minutes per session: Not on file  . Stress: Not on file  Relationships  . Social Herbalist on phone: Not on file    Gets together: Not on file    Attends religious service: Not on file    Active member of club or organization: Not on file    Attends meetings of clubs or organizations: Not on file    Relationship status: Not on file  . Intimate partner violence    Fear of current or ex partner: Not on file    Emotionally abused: Not on file    Physically abused: Not on file    Forced sexual activity: Not on file  Other Topics Concern  . Not on file  Social History Narrative  . Not on file    Past Surgical History:  Procedure Laterality Date  . ABDOMINAL HYSTERECTOMY    . CATARACT EXTRACTION W/ INTRAOCULAR LENS  IMPLANT, BILATERAL Bilateral 2000's  . DILATION AND CURETTAGE OF UTERUS     several  . FOOT SURGERY Right 1943-1944   4 surgeries  . IR KYPHO THORACIC WITH BONE BIOPSY  07/27/2017  . THYROIDECTOMY    . TONSILLECTOMY      No family history on file.  Allergies  Allergen Reactions  . Penicillins Hives and Rash    Has patient had a PCN reaction  causing immediate rash, facial/tongue/throat swelling, SOB or lightheadedness with hypotension: Yes Has patient had a PCN reaction causing severe rash involving mucus membranes or skin necrosis: Yes Has patient had a PCN reaction that required hospitalization No Has patient had a PCN reaction occurring within the last 10 years: No If all of the above answers are "NO", then may proceed with Cephalosporin use.   . Prednisone Swelling    Current Outpatient Medications on File Prior to Visit  Medication Sig Dispense Refill  . acetaminophen (TYLENOL) 650 MG CR tablet Take 650 mg by mouth every 8 (eight) hours  as needed for pain.     [place] Kitchen albuterol (PROVENTIL) (2.5 MG/3ML) 0.083% nebulizer solution Take 3 mLs (2.5 mg total) by nebulization every 6 (six) hours as needed for wheezing or shortness of breath. 150 mL 0  . alendronate (FOSAMAX) 70 MG tablet Take 70 mg by mouth once a week. Take with a full glass of water on an empty stomach. Mondays    . Cholecalciferol (VITAMIN D) 2000 units tablet Take 2,000 Units by mouth daily.    . Fluticasone-Salmeterol (ADVAIR DISKUS) 250-50 MCG/DOSE AEPB Inhale 1 puff into the lungs 2 (two) times daily. 1 each 5  . levothyroxine (SYNTHROID, LEVOTHROID) 100 MCG tablet Take 1 tablet by mouth every morning on an empty stomach with water. No food or other medications for 30 minutes. 90 tablet 3  . sertraline (ZOLOFT) 50 MG tablet Take 1 tablet (50 mg total) by mouth daily. For anxiety and depression. 90 tablet 1  . traZODone (DESYREL) 50 MG tablet Take 0.5 tablets (25 mg total) by mouth at bedtime. (Patient taking differently: Take 50 mg by mouth at bedtime. ) 30 tablet 0  . vitamin B-12 (CYANOCOBALAMIN) 1000 MCG tablet Take 1,000 mcg by mouth daily.     No current facility-administered medications on file prior to visit.     Wt 100 lb (45.4 kg)   BMI 18.29 kg/m    Objective:   Physical Exam  Constitutional: She is oriented to person, place, and time. She appears  well-nourished.  Respiratory: Effort normal.  Neurological: She is alert and oriented to person, place, and time.  Psychiatric: She has a normal mood and affect.           Assessment & Plan:

## 2018-09-11 NOTE — Assessment & Plan Note (Signed)
Chronic, no improvement with melatonin. Doing well with trazodone 50 mg prescribed by the ED. Given her success with this medication and lack of side effects we will continue at 50 mg nightly.  Her daughter will update with any changes.

## 2018-09-11 NOTE — Patient Instructions (Signed)
Continue trazodone 50 mg at night for sleep. Please send me a MyChart message when you need refills.  Try to get up every hour to stay active and increased energy levels.  It was a pleasure to see you today!

## 2018-09-11 NOTE — Assessment & Plan Note (Signed)
Chronic and continued. Labs and other work-up overall unremarkable from ED visit.  Recommended she try to stay active during the day when possible to prevent further deconditioning.  Labs from March 2020 including TSH stable.  We will continue to treat her insomnia in hopes of this improving her overall energy levels.

## 2018-10-03 DIAGNOSIS — F419 Anxiety disorder, unspecified: Secondary | ICD-10-CM

## 2018-10-03 DIAGNOSIS — G4700 Insomnia, unspecified: Secondary | ICD-10-CM

## 2018-10-03 DIAGNOSIS — F329 Major depressive disorder, single episode, unspecified: Secondary | ICD-10-CM

## 2018-10-04 MED ORDER — TRAZODONE HCL 50 MG PO TABS: 25.0000 mg | ORAL_TABLET | Freq: Every day | ORAL | 1 refills | Status: DC

## 2018-10-16 ENCOUNTER — Other Ambulatory Visit: Payer: Self-pay | Admitting: Primary Care

## 2018-10-16 DIAGNOSIS — E039 Hypothyroidism, unspecified: Secondary | ICD-10-CM

## 2018-12-27 ENCOUNTER — Other Ambulatory Visit: Payer: Self-pay | Admitting: Primary Care

## 2018-12-27 DIAGNOSIS — J439 Emphysema, unspecified: Secondary | ICD-10-CM

## 2019-01-13 IMAGING — CT CT ANGIO CHEST
2 of 8 series · 19 of 46 positions shown · IV contrast (iopamidol)
Comparison: CT scan of March 07, 2008.

CLINICAL DATA: Shortness of breath.

EXAM:
CT ANGIOGRAPHY CHEST WITH CONTRAST
TECHNIQUE: Multidetector CT imaging of the chest was performed using the
standard protocol during bolus administration of intravenous
contrast. Multiplanar CT image reconstructions and MIPs were
obtained to evaluate the vascular anatomy.
CONTRAST:  58 mL PE4SU8-JYZ IOPAMIDOL (PE4SU8-JYZ) INJECTION 76%

[Series 6: thins · axial · 0.60mm/px · z∈[+1058,+1351]mm · 16 of 323 slices shown]
[im 15/323  lung]
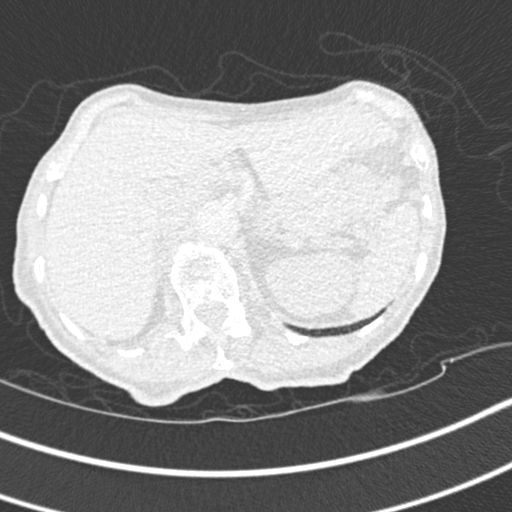
[im 30/323  soft-tissue]
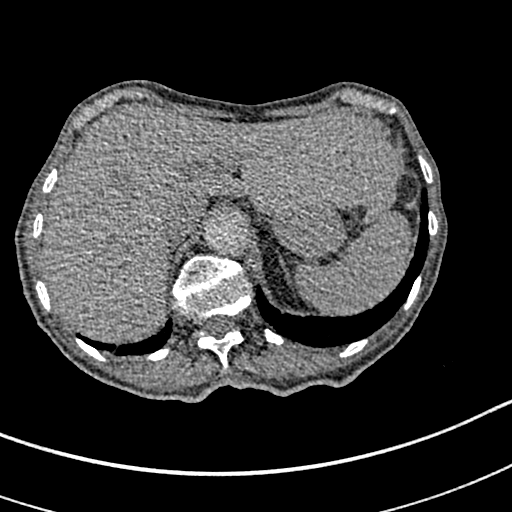
[im 59/323  lung]
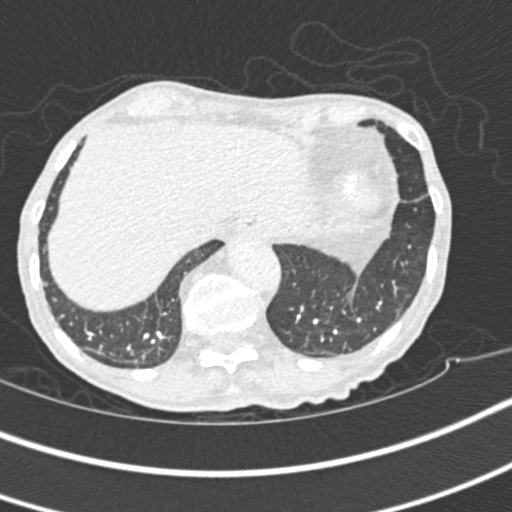
[im 74/323  soft-tissue]
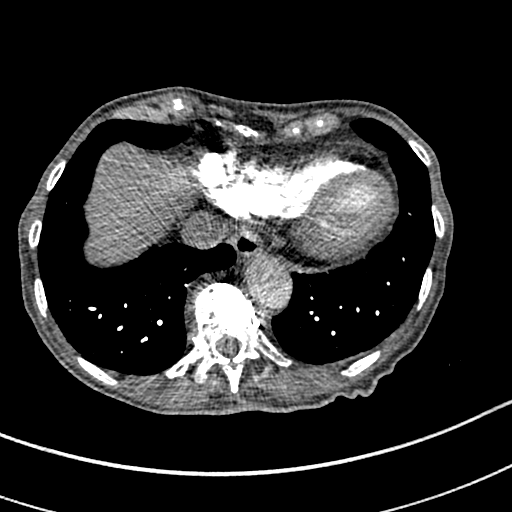
[im 88/323  lung]
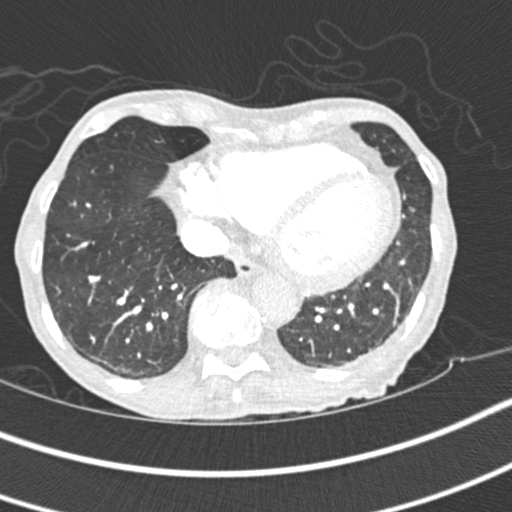
[im 118/323  soft-tissue]
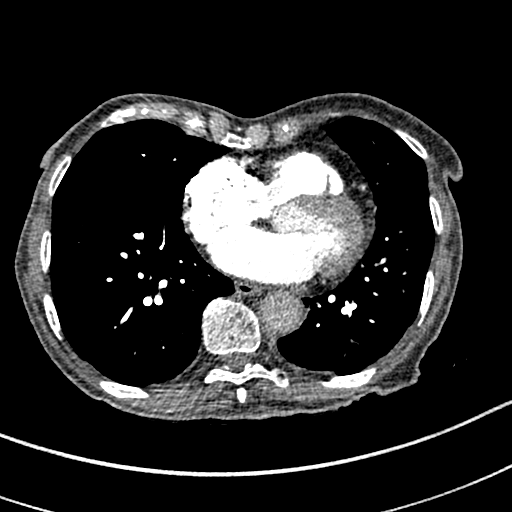
[im 132/323  lung]
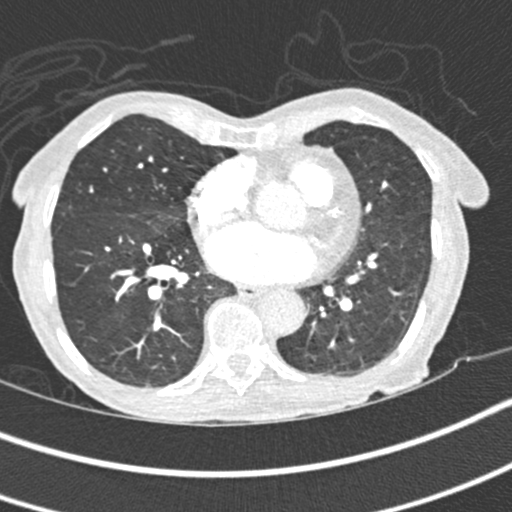
[im 147/323  soft-tissue]
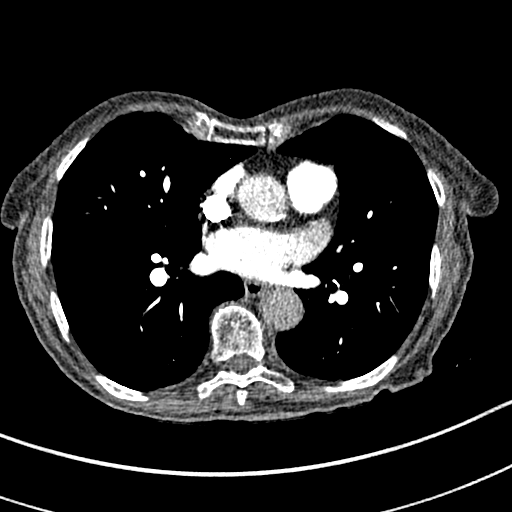
[im 176/323  lung]
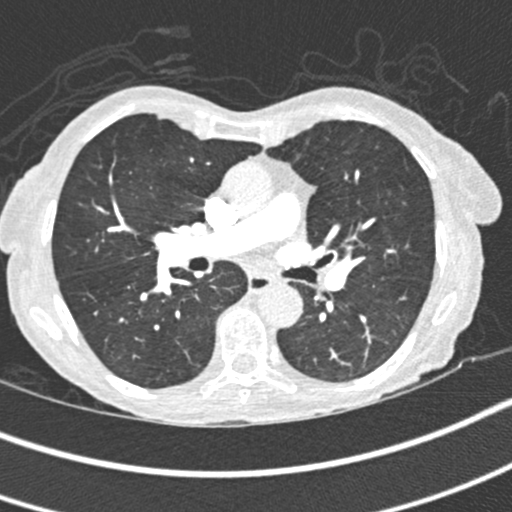
[im 191/323  soft-tissue]
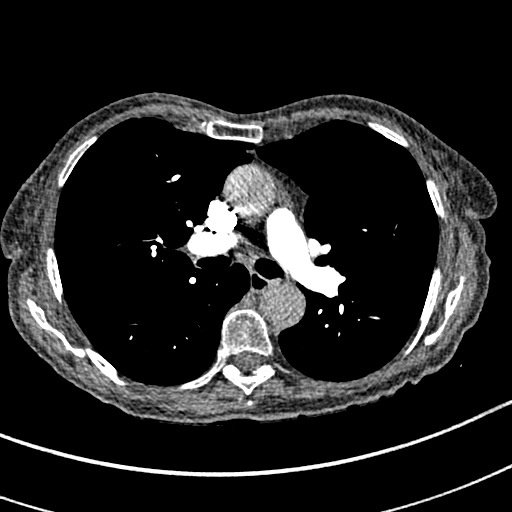
[im 205/323  lung]
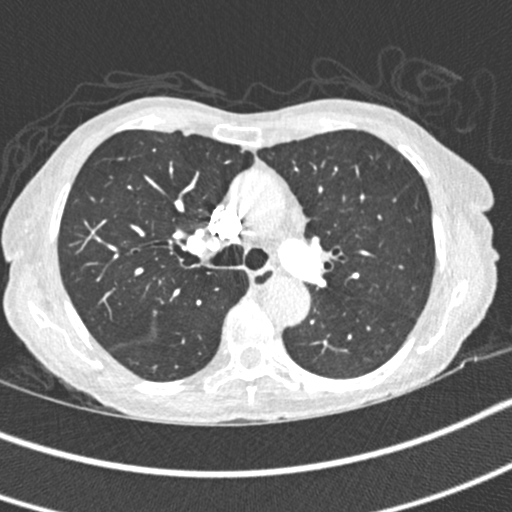
[im 235/323  soft-tissue]
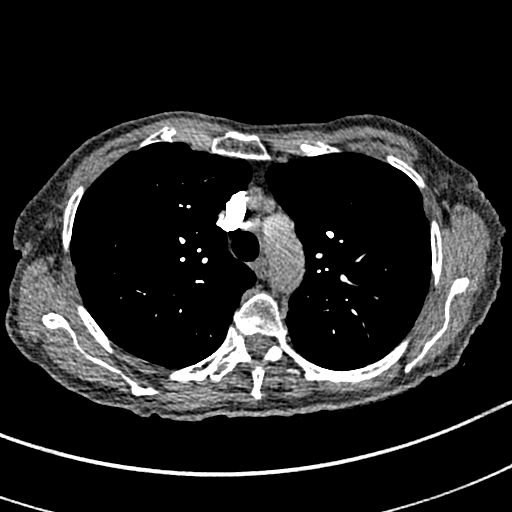
[im 249/323  lung]
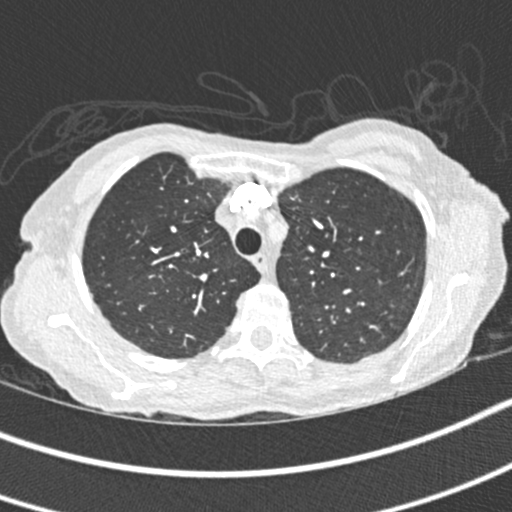
[im 264/323  soft-tissue]
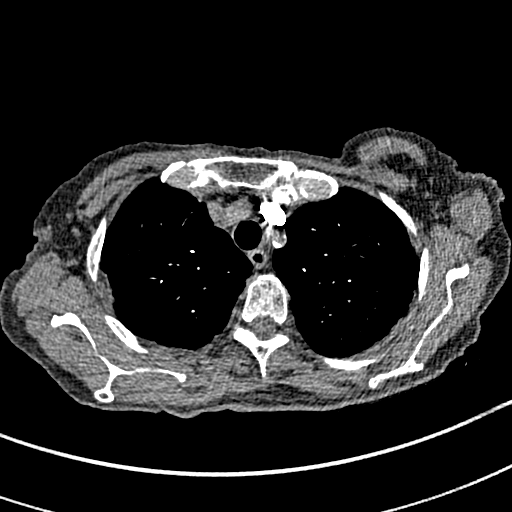
[im 293/323  lung]
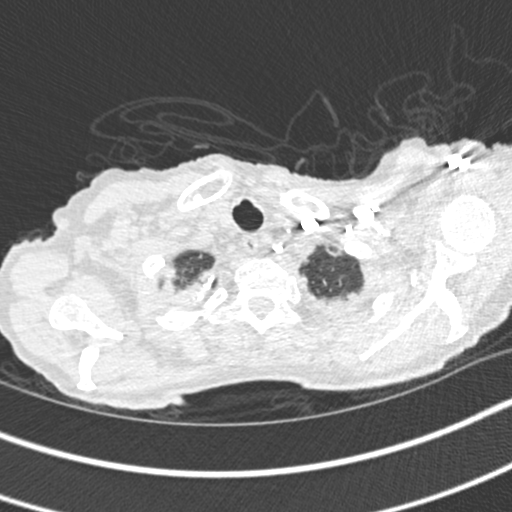
[im 308/323  soft-tissue]
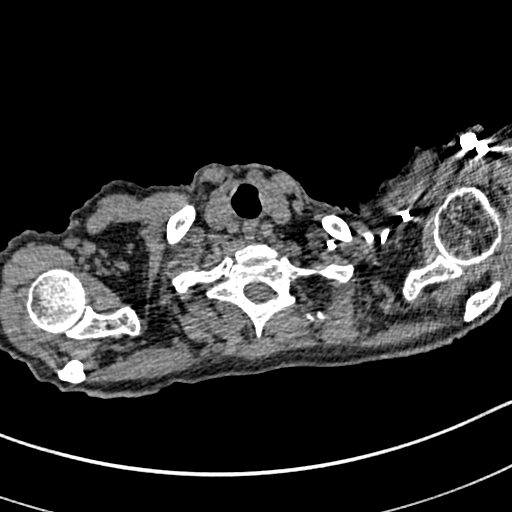

[Series 8: coronal mpr · coronal · 0.58mm/px · 3 of 102 slices shown]
[im 26/102  soft-tissue]
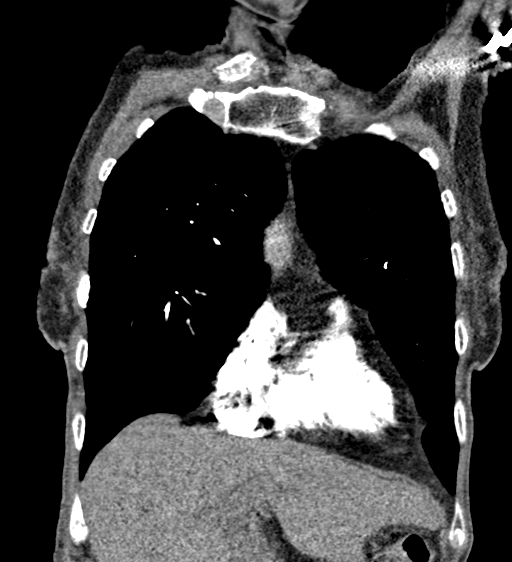
[im 51/102  soft-tissue]
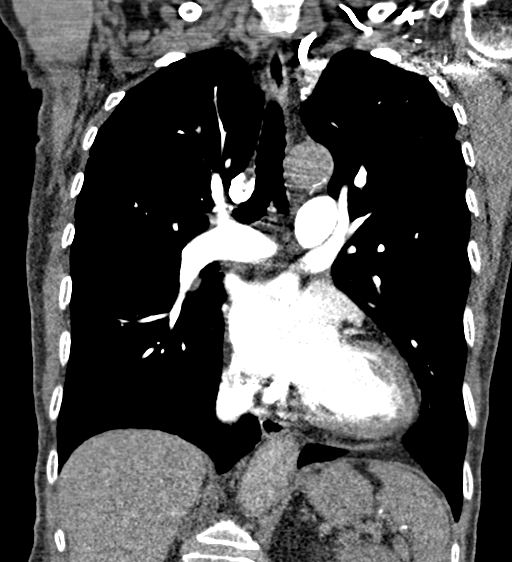
[im 76/102  soft-tissue]
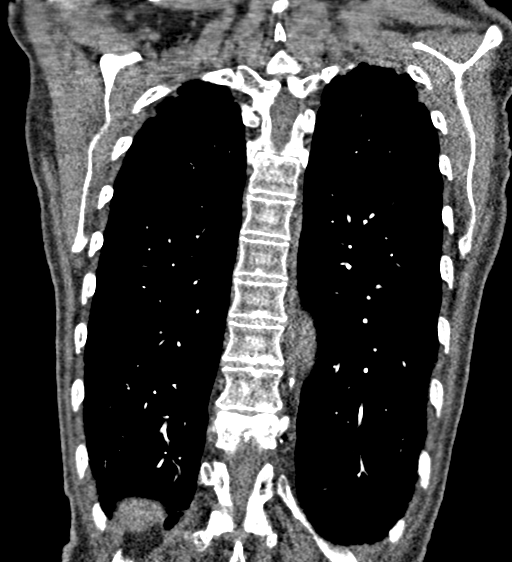

[19 of 46 positions shown; findings below may reference images not displayed]

FINDINGS: Cardiovascular: Satisfactory opacification of the pulmonary arteries
to the segmental level. No evidence of pulmonary embolism. Normal
heart size. No pericardial effusion.

Mediastinum/Nodes: No enlarged mediastinal, hilar, or axillary lymph
nodes. Thyroid gland, trachea, and esophagus demonstrate no
significant findings.

Lungs/Pleura: No pneumothorax or pleural effusion is noted. Mild
biapical scarring is noted. No acute pulmonary disease is noted.

Upper Abdomen: No acute abnormality.

Musculoskeletal: No chest wall abnormality. No acute or significant
osseous findings.

Review of the MIP images confirms the above findings.
IMPRESSION: No definite evidence of pulmonary embolus. No acute abnormality seen
in the chest.

## 2019-01-23 ENCOUNTER — Other Ambulatory Visit: Payer: Self-pay | Admitting: Primary Care

## 2019-01-23 DIAGNOSIS — F419 Anxiety disorder, unspecified: Secondary | ICD-10-CM

## 2019-01-23 DIAGNOSIS — F329 Major depressive disorder, single episode, unspecified: Secondary | ICD-10-CM

## 2019-03-27 ENCOUNTER — Ambulatory Visit (INDEPENDENT_AMBULATORY_CARE_PROVIDER_SITE_OTHER): Payer: Medicare Other | Admitting: Primary Care

## 2019-03-27 ENCOUNTER — Encounter: Payer: Self-pay | Admitting: Primary Care

## 2019-03-27 ENCOUNTER — Ambulatory Visit: Payer: Medicare Other

## 2019-03-27 ENCOUNTER — Other Ambulatory Visit: Payer: Self-pay

## 2019-03-27 VITALS — BP 124/78 | HR 58 | Temp 96.2°F | Ht 62.0 in | Wt 95.0 lb

## 2019-03-27 DIAGNOSIS — M8000XS Age-related osteoporosis with current pathological fracture, unspecified site, sequela: Secondary | ICD-10-CM | POA: Diagnosis not present

## 2019-03-27 DIAGNOSIS — E039 Hypothyroidism, unspecified: Secondary | ICD-10-CM

## 2019-03-27 DIAGNOSIS — F419 Anxiety disorder, unspecified: Secondary | ICD-10-CM | POA: Diagnosis not present

## 2019-03-27 DIAGNOSIS — R531 Weakness: Secondary | ICD-10-CM

## 2019-03-27 DIAGNOSIS — E2839 Other primary ovarian failure: Secondary | ICD-10-CM

## 2019-03-27 DIAGNOSIS — F329 Major depressive disorder, single episode, unspecified: Secondary | ICD-10-CM

## 2019-03-27 DIAGNOSIS — R630 Anorexia: Secondary | ICD-10-CM | POA: Insufficient documentation

## 2019-03-27 DIAGNOSIS — R42 Dizziness and giddiness: Secondary | ICD-10-CM

## 2019-03-27 DIAGNOSIS — E785 Hyperlipidemia, unspecified: Secondary | ICD-10-CM | POA: Diagnosis not present

## 2019-03-27 DIAGNOSIS — G4700 Insomnia, unspecified: Secondary | ICD-10-CM

## 2019-03-27 DIAGNOSIS — S22000A Wedge compression fracture of unspecified thoracic vertebra, initial encounter for closed fracture: Secondary | ICD-10-CM | POA: Diagnosis not present

## 2019-03-27 DIAGNOSIS — J439 Emphysema, unspecified: Secondary | ICD-10-CM | POA: Diagnosis not present

## 2019-03-27 DIAGNOSIS — D649 Anemia, unspecified: Secondary | ICD-10-CM

## 2019-03-27 DIAGNOSIS — M8000XA Age-related osteoporosis with current pathological fracture, unspecified site, initial encounter for fracture: Secondary | ICD-10-CM | POA: Insufficient documentation

## 2019-03-27 DIAGNOSIS — R296 Repeated falls: Secondary | ICD-10-CM

## 2019-03-27 MED ORDER — TRAZODONE HCL 50 MG PO TABS: 50.0000 mg | ORAL_TABLET | Freq: Every day | ORAL | 1 refills | Status: DC

## 2019-03-27 MED ORDER — SERTRALINE HCL 50 MG PO TABS: 50.0000 mg | ORAL_TABLET | Freq: Every day | ORAL | 0 refills | Status: DC

## 2019-03-27 MED ORDER — ALENDRONATE SODIUM 70 MG PO TABS: 70.0000 mg | ORAL_TABLET | ORAL | 3 refills | Status: DC

## 2019-03-27 MED ORDER — ADVAIR DISKUS 250-50 MCG/DOSE IN AEPB: INHALATION_SPRAY | RESPIRATORY_TRACT | 5 refills | Status: DC

## 2019-03-27 MED ORDER — MIRTAZAPINE 15 MG PO TABS: 7.5000 mg | ORAL_TABLET | Freq: Every day | ORAL | 0 refills | Status: DC

## 2019-03-27 MED ORDER — SERTRALINE HCL 100 MG PO TABS: 100.0000 mg | ORAL_TABLET | Freq: Every day | ORAL | 0 refills | Status: DC

## 2019-03-27 NOTE — Assessment & Plan Note (Signed)
Has not been taking alendronate for the last year according to daughter. Repeat bone density scan pending. Refills provided for alendronate.

## 2019-03-27 NOTE — Assessment & Plan Note (Signed)
History of vertigo, symptoms now are different.  Encouraged increased water intake. Check labs. ECG today with NSR with rate of 70, no PAC/PVC, no acute ST changes. Appears similar to ECG from August 2020.  Referral placed to [facility] PT for assistance with gait and imbalance.

## 2019-03-27 NOTE — Assessment & Plan Note (Signed)
Refills provided for alendronate. Bone density scan pending.

## 2019-03-27 NOTE — Assessment & Plan Note (Signed)
Taking levothyroxine incorrectly, discussed correct instructions.  Repeat TSH pending.

## 2019-03-27 NOTE — Assessment & Plan Note (Signed)
Increased dyspnea with mild exertion despite Advair and nebulized treatments. Referral placed to pulmonology for evaluation. Lungs clear on exam.

## 2019-03-27 NOTE — Progress Notes (Signed)
Subjective:    Patient ID: [name], female    DOB: [birth year], 86 y.o.   MRN: [id number]  HPI  This visit occurred during the SARS-CoV-2 public health emergency.  Safety protocols were in place, including screening questions prior to the visit, additional usage of staff PPE, and extensive cleaning of exam room while observing appropriate contact time as indicated for disinfecting solutions.   [name] is a 86 year old female with a history of syncope, hypotension, hypothyroidism, malnutrition, fatigue, osteoarthritis, COPD, osteoporosis with compression fracture, anxiety/insomnia who presents today with her daughter to discuss several issues.  1) Decreased Appetite: Acute during the last month, food just doesn't taste very good. She denies abdominal pain, nausea. She has been feeling more depressed and anxious given several recent deaths within the family. Her daughter doesn't believe that the Zoloft 50 mg is effective.   Diet currently consists of:  Breakfast: Breakfast sandwich, toast with jelly, waffles, breakfast sandwich. Lunch: None Dinner: Protein, vegetable, starch, applesauce, fruit Snacks: Fruit, peanut butter crackers with honey  Beverages: Coffee mainly, apple cider, sweet tea, Gatorade, little water.   2) Hypothyroidism: Currently managed on Synthroid 100 mcg. She is taking Synthroid every morning on an empty stomach with other medications. Due for TSH.  3) COPD: Currently managed on Advair Diskus daily. Her daughter endorses that she gets short of breath with minimal exertion (walking to the kitchen, light cooking). She is using her albuterol nebulizer 3-4 times weekly on average, could use it more. She has not seen pulmonology.   4) Anxiety/Depression/Insomnia: Currently managed on Zoloft 50 mg and trazodone 50 mg. Her daughter endorses that she has daily anxiety and depression symptoms. She is going through a lot of family stress, recent deaths in the family. She  really doesn't believe that the Zoloft has helped for anxiety/nerves over the last several months. She does believe it was initially effective. Did not notice any improvement with lexparo.   5) Osteoporosis: Chronic. Has been off of alendronate for about one year per her daughter. Due for bone density scan. Needs refills.  6) Dizziness: Acute episode of dizziness with near syncope after sitting at the card table about one week ago. After a few minutes she felt better. This also occurred 1-2 weeks prior, she is uncertain of the situation, lasted a few seconds. She has a history of vertigo. She admits to drinking very little water, mostly coffee, Gatorade, apple cider.  She has to move slowly with positional changes. She fell backwards in the kitchen two weeks ago, felt dizzy and fell with using her walker. She denies headaches, doesn't recall hitting her head but she did hit her lower back. Since then she's been ambulatory.   BP Readings from Last 3 Encounters:  03/27/19 124/78  09/06/18 (!) 169/62  05/22/18 109/62   Wt Readings from Last 3 Encounters:  03/27/19 95 lb (43.1 kg)  09/11/18 100 lb (45.4 kg)  09/06/18 100 lb (45.4 kg)     Review of Systems  Constitutional: Positive for appetite change. Negative for fever.  Eyes: Negative for visual disturbance.  Respiratory: Positive for shortness of breath. Negative for cough.   Cardiovascular: Negative for chest pain.  Gastrointestinal: Negative for abdominal pain and nausea.  Neurological: Positive for dizziness. Negative for headaches.  Psychiatric/Behavioral: The patient is nervous/anxious.        Past Medical History:  Diagnosis Date  . Anxiety   . Arthritis    "hands" (10/24/2012)  . B12 deficiency    "  get shots twice/month" (10/24/2012)  . Bursitis   . Depression   . Diverticulitis   . Emphysema dx 2012  . Exertional shortness of breath   . Headache(784.0)    "weekly at least" (10/24/2012)  . Hypothyroidism   .  Insomnia   . Ovarian cancer ([place])    mass hooked to sm. intestine/bladder  . Skin cancer of face   . Syncope    when neck is up or turned to side  . Syncope and collapse    "first time I ever I collapsed"; confirms h/o presyncopal events (10/24/2012)  . UTI (lower urinary tract infection)      Social History   Socioeconomic History  . Marital status: Widowed    Spouse name: Not on file  . Number of children: Not on file  . Years of education: Not on file  . Highest education level: Not on file  Occupational History  . Not on file  Tobacco Use  . Smoking status: Never Smoker  . Smokeless tobacco: Never Used  Substance and Sexual Activity  . Alcohol use: No  . Drug use: No  . Sexual activity: Never  Other Topics Concern  . Not on file  Social History Narrative  . Not on file   Social Determinants of Health   Financial Resource Strain:   . Difficulty of Paying Living Expenses:   Food Insecurity:   . Worried About Charity fundraiser in the Last Year:   . Arboriculturist in the Last Year:   Transportation Needs:   . Film/video editor (Medical):   [place] Kitchen Lack of Transportation (Non-Medical):   Physical Activity:   . Days of Exercise per Week:   . Minutes of Exercise per Session:   Stress:   . Feeling of Stress :   Social Connections:   . Frequency of Communication with Friends and Family:   . Frequency of Social Gatherings with Friends and Family:   . Attends Religious Services:   . Active Member of Clubs or Organizations:   . Attends Archivist Meetings:   [place] Kitchen Marital Status:   Intimate Partner Violence:   . Fear of Current or Ex-Partner:   . Emotionally Abused:   [place] Kitchen Physically Abused:   . Sexually Abused:     Past Surgical History:  Procedure Laterality Date  . ABDOMINAL HYSTERECTOMY    . CATARACT EXTRACTION W/ INTRAOCULAR LENS  IMPLANT, BILATERAL Bilateral 2000's  . DILATION AND CURETTAGE OF UTERUS     several  . FOOT SURGERY Right 1943-1944    4 surgeries  . IR KYPHO THORACIC WITH BONE BIOPSY  07/27/2017  . THYROIDECTOMY    . TONSILLECTOMY      No family history on file.  Allergies  Allergen Reactions  . Penicillins Hives and Rash    Has patient had a PCN reaction causing immediate rash, facial/tongue/throat swelling, SOB or lightheadedness with hypotension: Yes Has patient had a PCN reaction causing severe rash involving mucus membranes or skin necrosis: Yes Has patient had a PCN reaction that required hospitalization No Has patient had a PCN reaction occurring within the last 10 years: No If all of the above answers are "NO", then may proceed with Cephalosporin use.   . Prednisone Swelling    Current Outpatient Medications on File Prior to Visit  Medication Sig Dispense Refill  . acetaminophen (TYLENOL) 650 MG CR tablet Take 650 mg by mouth every 8 (eight) hours as needed for pain.     [place] Kitchen  albuterol (PROVENTIL) (2.5 MG/3ML) 0.083% nebulizer solution Take 3 mLs (2.5 mg total) by nebulization every 6 (six) hours as needed for wheezing or shortness of breath. 150 mL 0  . Cholecalciferol (VITAMIN D) 2000 units tablet Take 2,000 Units by mouth daily.    [place] Kitchen SYNTHROID 100 MCG tablet TAKE 1 TABLET BY MOUTH EVERY MORNING ON AN EMPTY STOMACH WITH WATER. NO FOOD OR OTHER MEDICATIONS FOR 30 MINUTES. 90 tablet 1  . vitamin B-12 (CYANOCOBALAMIN) 1000 MCG tablet Take 1,000 mcg by mouth daily.     No current facility-administered medications on file prior to visit.    BP 124/78   Pulse (!) 58   Temp (!) 96.2 F (35.7 C) (Temporal)   Ht 5\' 2"  (1.575 m)   Wt 95 lb (43.1 kg)   SpO2 96%   BMI 17.38 kg/m    Objective:   Physical Exam  Constitutional: She appears well-nourished.  Frail appearing  Cardiovascular: Normal rate and regular rhythm.  Respiratory: Effort normal and breath sounds normal.  Musculoskeletal:     Cervical back: Neck supple.     Comments: Ambulates with walker slowly in clinic.  Skin: Skin is warm and dry.    Psychiatric: She has a normal mood and affect.           Assessment & Plan:

## 2019-03-27 NOTE — Assessment & Plan Note (Signed)
Stop Trazodone. Switch to Remeron for sleep, mood, and appetite. Her daughter will update.

## 2019-03-27 NOTE — Assessment & Plan Note (Signed)
Chronic and continued. Appears malnourished.  Encouraged increased consumption of water. Add Remeron for appetite stimulation.  Referral to [facility] PT placed for strength and balance.

## 2019-03-27 NOTE — Assessment & Plan Note (Signed)
Checking labs today. Could be anxiety related. Add Remeron 15 mg nightly. Stop Trazodone. Her daughter will update.

## 2019-03-27 NOTE — Patient Instructions (Addendum)
Stop by the lab prior to leaving today. I will notify you of your results once received.   Call the breast center to schedule your bone density test.  Continue to sertraline (Zoloft) 50 mg once daily for anxiety.  Start mirtazapine (Remeron) 15 mg at bedtime for sleep, appetite, and mood.   Resume alendronate (Fosamax) for bone strength. Take this once weekly on an empty stomach with water. Do not lay flat for 30 minutes after taking.  You will be contacted regarding your referral to pulmonology for your breathing.  Please let us know if you have not been contacted within two weeks.   Try Premier Protein shakes. The vanilla is good.  Increase water intake to prevent dehydration. Try to drink 4 bottles daily at least.  Please update me regarding anxiety, sleep, mood, appetite.  It was a pleasure to see you today!

## 2019-03-27 NOTE — Assessment & Plan Note (Signed)
Doesn't seem as though Zoloft 50 mg is effective. Will start with Remeron 15 mg nightly for mood, appetite, and sleep. If no improvement in mood then consider dose increase to 100 mg on Zoloft.  Daughter will update.

## 2019-03-28 ENCOUNTER — Telehealth: Payer: Self-pay

## 2019-03-28 LAB — COMPREHENSIVE METABOLIC PANEL
ALT: 7 U/L (ref 0–35)
AST: 13 U/L (ref 0–37)
Albumin: 4.1 g/dL (ref 3.5–5.2)
Alkaline Phosphatase: 35 U/L — ABNORMAL LOW (ref 39–117)
BUN: 16 mg/dL (ref 6–23)
CO2: 29 mEq/L (ref 19–32)
Calcium: 9.6 mg/dL (ref 8.4–10.5)
Chloride: 103 mEq/L (ref 96–112)
Creatinine, Ser: 0.87 mg/dL (ref 0.40–1.20)
GFR: 61.36 mL/min (ref >60.00)
Glucose, Bld: 87 mg/dL (ref 70–99)
Potassium: 3.6 mEq/L (ref 3.5–5.1)
Sodium: 138 mEq/L (ref 135–145)
Total Bilirubin: 0.3 mg/dL (ref 0.2–1.2)
Total Protein: 6.9 g/dL (ref 6.0–8.3)

## 2019-03-28 LAB — LIPID PANEL
Cholesterol: 188 mg/dL (ref 0–200)
HDL: 57.3 mg/dL (ref >39.00)
LDL Cholesterol: 121 mg/dL — ABNORMAL HIGH (ref 0–99)
NonHDL: 131.12
Total CHOL/HDL Ratio: 3
Triglycerides: 50 mg/dL (ref 0.0–149.0)
VLDL: 10 mg/dL (ref 0.0–40.0)

## 2019-03-28 LAB — CBC
HCT: 31.7 % — ABNORMAL LOW (ref 36.0–46.0)
Hemoglobin: 10.7 g/dL — ABNORMAL LOW (ref 12.0–15.0)
MCHC: 33.6 g/dL (ref 30.0–36.0)
MCV: 94 fl (ref 78.0–100.0)
Platelets: 213 10*3/uL (ref 150.0–400.0)
RBC: 3.37 Mil/uL — ABNORMAL LOW (ref 3.87–5.11)
RDW: 13.7 % (ref 11.5–15.5)
WBC: 5.7 10*3/uL (ref 4.0–10.5)

## 2019-03-28 LAB — TSH: TSH: 9.71 u[IU]/mL — ABNORMAL HIGH (ref 0.35–4.50)

## 2019-03-28 NOTE — Telephone Encounter (Signed)
DEXA Order faxed to [place].

## 2019-03-28 NOTE — Telephone Encounter (Signed)
Jerline PT with [facility] left v/m that pt is declining HH PT services now. Jerline appreciates the referral.

## 2019-03-28 NOTE — Telephone Encounter (Signed)
Noted  

## 2019-03-29 NOTE — Telephone Encounter (Signed)
Noted  

## 2019-03-29 NOTE — Telephone Encounter (Signed)
Contacted pt due to referral coordinator reporting pt did not want PT and asked this nurse to call the pt.  Contacted pt and advised of the importance of PT. Pt said she has had PT in the past and she knows what exercises to do and she can do them on her own. Advised Anda Kraft would like for her to have PT in the home. Pt refused and said she will do it on her own. Advised if she changed her mind to contact the office. Pt verbalized understanding.

## 2019-04-16 ENCOUNTER — Other Ambulatory Visit: Payer: Self-pay | Admitting: Primary Care

## 2019-04-16 DIAGNOSIS — E039 Hypothyroidism, unspecified: Secondary | ICD-10-CM

## 2019-04-19 ENCOUNTER — Other Ambulatory Visit: Payer: Self-pay

## 2019-04-19 ENCOUNTER — Encounter: Payer: Self-pay | Admitting: Pulmonary Disease

## 2019-04-19 ENCOUNTER — Ambulatory Visit (INDEPENDENT_AMBULATORY_CARE_PROVIDER_SITE_OTHER): Payer: Medicare Other | Admitting: Pulmonary Disease

## 2019-04-19 VITALS — BP 122/68 | HR 90 | Temp 98.2°F | Ht 60.0 in | Wt 197.0 lb

## 2019-04-19 DIAGNOSIS — R1312 Dysphagia, oropharyngeal phase: Secondary | ICD-10-CM

## 2019-04-19 DIAGNOSIS — R9431 Abnormal electrocardiogram [ECG] [EKG]: Secondary | ICD-10-CM

## 2019-04-19 DIAGNOSIS — R5383 Other fatigue: Secondary | ICD-10-CM

## 2019-04-19 DIAGNOSIS — R0789 Other chest pain: Secondary | ICD-10-CM

## 2019-04-19 DIAGNOSIS — R0602 Shortness of breath: Secondary | ICD-10-CM | POA: Diagnosis not present

## 2019-04-19 NOTE — Patient Instructions (Addendum)
You do not have COPD or emphysema.  I am concerned of potential heart issue causing her symptoms  We are going to schedule a heart test (echocardiogram)  I am going to have either Dr. Rockey Situ or Dr. Saunders Revel evaluate you to check your heart closely and also to evaluate you for your recent abnormal EKG  We are checking a blood test to evaluate your fatigue  Oxygen level at nighttime  We will see you here in follow-up in 3 to 4 weeks time

## 2019-04-19 NOTE — Progress Notes (Signed)
Subjective:    Patient ID: [name], female    DOB: Jun 17, 1928, 86 y.o.   MRN: [id number]  HPI [name] is an 86 year old lifelong never smoker who presents for evaluation of shortness of breath.  Referred by Alma Friendly, NP.  The patient presents today with her daughter.  She is very frail and is in a transport chair.  She reports that her symptoms are actually feeling that she "cannot go on" when she does any activity.  She does not describe it as shortness of breath per se but more as fatigue.  She states that her shortness of breath/fatigue is getting much worse.  She has been having the symptoms for approximately 2 years and have been worsening over the last several months.  She has had prior syncopal episodes previously but now has noted that she has had more issues with dizziness and that she has had a few episodes of near syncope lately.  She states that her symptoms are relieved by sitting still or rest.  Her symptoms are worsened by doing anything that requires any activity.  It is perplexing that she carries a diagnosis of COPD and emphysema since approximately 2012.  She has been a lifelong never smoker.  She has been on Advair for a supposed history of COPD for many years.  She actually only uses it once a day.  She notes that Advair and nebulizer treatments do nothing for her current symptoms.  Of note I have reviewed a CT scan of the chest performed in 2019 and there was no evidence of emphysema.  I suspect she may have had issues with asthma in the past.  The patient states that previously she was followed by Dr. Kirk Ruths of cardiology.  It seems that she got lost to follow-up somewhere around 2016 and never returned to see him.  She tells me that "he thought there was some issue with my heart" but cannot tell me what.  I have not been able to pull the results from multiple 2D echo still the most recent 2D echo was performed in 2017 and showed diastolic dysfunction and  increased left ventricular and left atrial filling pressures.  A spirometry performed in 2019 showed very poor effort and appeared to have some mild obstruction/restriction combination however she has never had formal PFTs.  Patient does not endorse any fevers, chills or sweats.  She has not had any nausea or vomiting.  She has been having issues with dysgeusia, has also had issues with "burning throat".  She has difficulties with dysphagia as well.  She also has been experiencing some chest pressure that she cannot elaborate as to when it occurs.  No diaphoresis.  Does wake up once a night to void.  Has a bedside commode.  No orthopnea per se.  She does note lower extremity edema towards the end of the day.  Because of her swallowing issues she has had significant subjective weight loss.  In general she appears that she is having issues with failure to thrive.  The patient still lives in her home.  Daughter comes and pretty much stays with her during the day, son lives with her and stays with her at night.  Most recent ECG performed 27 March 2019 shows possible anteroseptal injury in the past.  Review of her records show that she has had at least multiple episodes of syncope/near syncope for a number of years.  She has had the complaint of shortness of breath  documented for approximately 2 years.   Past medical history, surgical history and social history of been reviewed.  She has a personal history of malignancy namely ovarian cancer.  As noted she is a lifelong never smoker.  Living arrangements as noted above.  She does not have any significant occupational history, no military history.  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  Review of Systems A 10 point review of systems was performed and it is as noted above otherwise negative.    Objective:   Physical Exam BP 122/68 (BP Location: Right Arm, Cuff Size: Normal)   Pulse 90   Temp 98.2 F (36.8 C) (Temporal)   Ht 5' (1.524 m)   Wt 94 lb  3.2 oz (42.7 kg)   SpO2 95%   BMI 18.40 kg/m  GENERAL: Frail, thin elderly woman, presents in transport chair. HEAD: Normocephalic, atraumatic.  EYES: Pupils equal, round, reactive to light.  No scleral icterus.  Mild periorbital edema. MOUTH: Nose/mouth/throat not examined due to masking requirements for COVID 19. NECK: Supple. No thyromegaly. No nodules. No JVD, patient sitting position.  PULMONARY: Lungs clear to auscultation bilaterally. CARDIOVASCULAR: S1 and S2. Regular rate and rhythm.  Occasional extrasystoles.  S4 present. GASTROINTESTINAL: Scaphoid abdomen, soft, nondistended. MUSCULOSKELETAL: No joint deformity, no clubbing, no edema.  NEUROLOGIC: Grossly no focality.  Voice is weak. SKIN: Intact,warm,dry, no overt rashes. PSYCH: Psychomotor retardation.  Appears depressed.  Chart and laboratory data reviewed personally.     Assessment & Plan:   Shortness of breath/fatigue She describes the symptom more as fatigue Abnormal auscultation of the heart with abnormal EKG Query cardiac cause of dyspnea, obtain 2D echo Overnight oximetry to exclude nocturnal oxygen desaturation History of "COPD" suspect asthma more likely No relief with bronchodilators, doubt main cause is pulmonary Cortisol level for evaluation of fatigue  Chest pressure Syncope/presyncope Abnormal ECG 2D echo as noted above Cardiology referral She followed previously with Dr. Kirk Ruths  Dysphagia, "Burning" sensation of throat Anemia Note that she is on Fosamax May be prudent to withhold this Query gastroesophageal reflux with esophagitis Consider trial of PPI  Frailty/debility Adult failure to thrive Not responding to antidepressants Question cumulative effect of Remeron plus Zoloft   We will see the patient in follow-up in 3 to 4 weeks time she is to contact us prior to that time should any new difficulties arise.   Renold Don, MD [facility]  *This note was dictated  using voice recognition software/Dragon.  Despite best efforts to proofread, errors can occur which can change the meaning.  Any change was purely unintentional.

## 2019-04-23 ENCOUNTER — Telehealth: Payer: Self-pay

## 2019-04-23 NOTE — Telephone Encounter (Signed)
Yes that is fine to have one visitor. Please let the daughter know. Thanks!

## 2019-04-23 NOTE — Telephone Encounter (Signed)
Per daughter visitor needed for appt as patient is fragile and always comes with daughter

## 2019-04-24 ENCOUNTER — Other Ambulatory Visit: Payer: Self-pay | Admitting: Primary Care

## 2019-04-24 ENCOUNTER — Encounter: Payer: Self-pay | Admitting: Primary Care

## 2019-04-24 DIAGNOSIS — F419 Anxiety disorder, unspecified: Secondary | ICD-10-CM

## 2019-04-24 DIAGNOSIS — M810 Age-related osteoporosis without current pathological fracture: Secondary | ICD-10-CM | POA: Diagnosis not present

## 2019-04-24 DIAGNOSIS — F329 Major depressive disorder, single episode, unspecified: Secondary | ICD-10-CM

## 2019-04-24 DIAGNOSIS — G4700 Insomnia, unspecified: Secondary | ICD-10-CM

## 2019-04-24 DIAGNOSIS — R630 Anorexia: Secondary | ICD-10-CM

## 2019-04-25 NOTE — Telephone Encounter (Signed)
How is she doing on the mirtazapine for sleep and appetite? Do they want a refill?

## 2019-04-25 NOTE — Telephone Encounter (Signed)
Last prescribed on 03/27/2019 . Last appointment on 03/27/2019. No future appointment

## 2019-04-26 ENCOUNTER — Ambulatory Visit (INDEPENDENT_AMBULATORY_CARE_PROVIDER_SITE_OTHER): Payer: Medicare Other

## 2019-04-26 VITALS — BP 126/77 | HR 80 | Wt 95.0 lb

## 2019-04-26 DIAGNOSIS — Z0000 Encounter for general adult medical examination without abnormal findings: Secondary | ICD-10-CM

## 2019-04-26 NOTE — Patient Instructions (Signed)
[name] , Thank you for taking time to come for your Medicare Wellness Visit. I appreciate your ongoing commitment to your health goals. Please review the following plan we discussed and let me know if I can assist you in the future.   Screening recommendations/referrals: Colonoscopy: no longer required Mammogram: no longer required Bone Density: Order sent to [facility]  Recommended yearly ophthalmology/optometry visit for glaucoma screening and checkup Recommended yearly dental visit for hygiene and checkup  Vaccinations: Influenza vaccine: Fall 2021 Pneumococcal vaccine: declined Tdap vaccine: decline Shingles vaccine: discussed    Advanced directives: Please bring a copy of your POA (Power of Attorney) and/or Living Will to your next appointment.  Conditions/risks identified: none  Next appointment: none   Preventive Care 65 Years and Older, Female Preventive care refers to lifestyle choices and visits with your health care provider that can promote health and wellness. What does preventive care include?  A yearly physical exam. This is also called an annual well check.  Dental exams once or twice a year.  Routine eye exams. Ask your health care provider how often you should have your eyes checked.  Personal lifestyle choices, including:  Daily care of your teeth and gums.  Regular physical activity.  Eating a healthy diet.  Avoiding tobacco and drug use.  Limiting alcohol use.  Practicing safe sex.  Taking low-dose aspirin every day.  Taking vitamin and mineral supplements as recommended by your health care provider. What happens during an annual well check? The services and screenings done by your health care provider during your annual well check will depend on your age, overall health, lifestyle risk factors, and family history of disease. Counseling  Your health care provider may ask you questions about your:  Alcohol use.  Tobacco use.  Drug  use.  Emotional well-being.  Home and relationship well-being.  Sexual activity.  Eating habits.  History of falls.  Memory and ability to understand (cognition).  Work and work Statistician.  Reproductive health. Screening  You may have the following tests or measurements:  Height, weight, and BMI.  Blood pressure.  Lipid and cholesterol levels. These may be checked every 5 years, or more frequently if you are over 79 years old.  Skin check.  Lung cancer screening. You may have this screening every year starting at age 78 if you have a 30-pack-year history of smoking and currently smoke or have quit within the past 15 years.  Fecal occult blood test (FOBT) of the stool. You may have this test every year starting at age 22.  Flexible sigmoidoscopy or colonoscopy. You may have a sigmoidoscopy every 5 years or a colonoscopy every 10 years starting at age 52.  Hepatitis C blood test.  Hepatitis B blood test.  Sexually transmitted disease (STD) testing.  Diabetes screening. This is done by checking your blood sugar (glucose) after you have not eaten for a while (fasting). You may have this done every 1-3 years.  Bone density scan. This is done to screen for osteoporosis. You may have this done starting at age 20.  Mammogram. This may be done every 1-2 years. Talk to your health care provider about how often you should have regular mammograms. Talk with your health care provider about your test results, treatment options, and if necessary, the need for more tests. Vaccines  Your health care provider may recommend certain vaccines, such as:  Influenza vaccine. This is recommended every year.  Tetanus, diphtheria, and acellular pertussis (Tdap, Td) vaccine. You  may need a Td booster every 10 years.  Zoster vaccine. You may need this after age 2.  Pneumococcal 13-valent conjugate (PCV13) vaccine. One dose is recommended after age 53.  Pneumococcal polysaccharide  (PPSV23) vaccine. One dose is recommended after age 48. Talk to your health care provider about which screenings and vaccines you need and how often you need them. This information is not intended to replace advice given to you by your health care provider. Make sure you discuss any questions you have with your health care provider. Document Released: 01/24/2015 Document Revised: 09/17/2015 Document Reviewed: 10/29/2014 Elsevier Interactive Patient Education  2017 [place] Prevention in the Home Falls can cause injuries. They can happen to people of all ages. There are many things you can do to make your home safe and to help prevent falls. What can I do on the outside of my home?  Regularly fix the edges of walkways and driveways and fix any cracks.  Remove anything that might make you trip as you walk through a door, such as a raised step or threshold.  Trim any bushes or trees on the path to your home.  Use bright outdoor lighting.  Clear any walking paths of anything that might make someone trip, such as rocks or tools.  Regularly check to see if handrails are loose or broken. Make sure that both sides of any steps have handrails.  Any raised decks and porches should have guardrails on the edges.  Have any leaves, snow, or ice cleared regularly.  Use sand or salt on walking paths during winter.  Clean up any spills in your garage right away. This includes oil or grease spills. What can I do in the bathroom?  Use night lights.  Install grab bars by the toilet and in the tub and shower. Do not use towel bars as grab bars.  Use non-skid mats or decals in the tub or shower.  If you need to sit down in the shower, use a plastic, non-slip stool.  Keep the floor dry. Clean up any water that spills on the floor as soon as it happens.  Remove soap buildup in the tub or shower regularly.  Attach bath mats securely with double-sided non-slip rug tape.  Do not have  throw rugs and other things on the floor that can make you trip. What can I do in the bedroom?  Use night lights.  Make sure that you have a light by your bed that is easy to reach.  Do not use any sheets or blankets that are too big for your bed. They should not hang down onto the floor.  Have a firm chair that has side arms. You can use this for support while you get dressed.  Do not have throw rugs and other things on the floor that can make you trip. What can I do in the kitchen?  Clean up any spills right away.  Avoid walking on wet floors.  Keep items that you use a lot in easy-to-reach places.  If you need to reach something above you, use a strong step stool that has a grab bar.  Keep electrical cords out of the way.  Do not use floor polish or wax that makes floors slippery. If you must use wax, use non-skid floor wax.  Do not have throw rugs and other things on the floor that can make you trip. What can I do with my stairs?  Do not leave any items  on the stairs.  Make sure that there are handrails on both sides of the stairs and use them. Fix handrails that are broken or loose. Make sure that handrails are as long as the stairways.  Check any carpeting to make sure that it is firmly attached to the stairs. Fix any carpet that is loose or worn.  Avoid having throw rugs at the top or bottom of the stairs. If you do have throw rugs, attach them to the floor with carpet tape.  Make sure that you have a light switch at the top of the stairs and the bottom of the stairs. If you do not have them, ask someone to add them for you. What else can I do to help prevent falls?  Wear shoes that:  Do not have high heels.  Have rubber bottoms.  Are comfortable and fit you well.  Are closed at the toe. Do not wear sandals.  If you use a stepladder:  Make sure that it is fully opened. Do not climb a closed stepladder.  Make sure that both sides of the stepladder are  locked into place.  Ask someone to hold it for you, if possible.  Clearly mark and make sure that you can see:  Any grab bars or handrails.  First and last steps.  Where the edge of each step is.  Use tools that help you move around (mobility aids) if they are needed. These include:  Canes.  Walkers.  Scooters.  Crutches.  Turn on the lights when you go into a dark area. Replace any light bulbs as soon as they burn out.  Set up your furniture so you have a clear path. Avoid moving your furniture around.  If any of your floors are uneven, fix them.  If there are any pets around you, be aware of where they are.  Review your medicines with your doctor. Some medicines can make you feel dizzy. This can increase your chance of falling. Ask your doctor what other things that you can do to help prevent falls. This information is not intended to replace advice given to you by your health care provider. Make sure you discuss any questions you have with your health care provider. Document Released: 10/24/2008 Document Revised: 06/05/2015 Document Reviewed: 02/01/2014 Elsevier Interactive Patient Education  2017 [employer].

## 2019-04-26 NOTE — Progress Notes (Signed)
Subjective:   [name] is a 86 y.o. female who presents for Medicare Annual (Subsequent) preventive examination.  Review of Systems: N/A    This visit is being conducted through telemedicine via telephone at the nurse health advisor's home address due to the COVID-19 pandemic. This patient has given me verbal consent via doximity to conduct this visit, patient states they are participating from their home address. Patient and myself are on the telephone call. There is no referral for this visit. Some vital signs may be absent or patient reported.    Patient identification: identified by name, DOB, and current address   Cardiac Risk Factors include: advanced age (>53men, >43 women)     Objective:     Vitals: BP 126/77   Pulse 80   Wt 95 lb (43.1 kg)   BMI 18.55 kg/m   Body mass index is 18.55 kg/m.  Advanced Directives 04/26/2019 09/06/2018 03/21/2018 07/27/2017 06/20/2016 10/24/2012 01/25/2011  Does Patient Have a Medical Advance Directive? Yes No Yes No No Patient has advance directive, copy not in chart Patient has advance directive, copy not in chart  Type of Advance Directive [place];Living will - [place];Living will - - - [place] in Chart? No - copy requested - No - copy requested - - Copy requested from family Copy requested from family  Would patient like information on creating a medical advance directive? - No - Patient declined - No - Patient declined - - -  Pre-existing out of facility DNR order (yellow form or pink MOST form) - - - - - - Other (comment)    Tobacco Social History   Tobacco Use  Smoking Status Never Smoker  Smokeless Tobacco Never Used     Counseling given: Not Answered   Clinical Intake:  Pre-visit preparation completed: Yes  Pain : No/denies pain     Nutritional Risks: None Diabetes: No  How often do you need to have someone help you  when you read instructions, pamphlets, or other written materials from your doctor or pharmacy?: 1 - Never What is the last grade level you completed in school?: 11th  Interpreter Needed?: No  Information entered by :: CJohnson, LPN  Past Medical History:  Diagnosis Date  . Anxiety   . Arthritis    "hands" (10/24/2012)  . B12 deficiency    "get shots twice/month" (10/24/2012)  . Bursitis   . Depression   . Diverticulitis   . Exertional shortness of breath   . Headache(784.0)    "weekly at least" (10/24/2012)  . Hypothyroidism   . Insomnia   . Ovarian cancer ([facility])    mass hooked to sm. intestine/bladder  . Skin cancer of face   . Syncope    when neck is up or turned to side  . Syncope and collapse    "first time I ever I collapsed"; confirms h/o presyncopal events (10/24/2012)  . UTI (lower urinary tract infection)    Past Surgical History:  Procedure Laterality Date  . ABDOMINAL HYSTERECTOMY    . CATARACT EXTRACTION W/ INTRAOCULAR LENS  IMPLANT, BILATERAL Bilateral 2000's  . DILATION AND CURETTAGE OF UTERUS     several  . FOOT SURGERY Right 1943-1944   4 surgeries  . IR KYPHO THORACIC WITH BONE BIOPSY  07/27/2017  . THYROIDECTOMY    . TONSILLECTOMY     History reviewed. No pertinent family history. Social History  Socioeconomic History  . Marital status: Widowed    Spouse name: Not on file  . Number of children: Not on file  . Years of education: Not on file  . Highest education level: Not on file  Occupational History  . Not on file  Tobacco Use  . Smoking status: Never Smoker  . Smokeless tobacco: Never Used  Substance and Sexual Activity  . Alcohol use: No  . Drug use: No  . Sexual activity: Never  Other Topics Concern  . Not on file  Social History Narrative  . Not on file   Social Determinants of Health   Financial Resource Strain: Low Risk   . Difficulty of Paying Living Expenses: Not hard at all  Food Insecurity: No Food Insecurity  .  Worried About Charity fundraiser in the Last Year: Never true  . Ran Out of Food in the Last Year: Never true  Transportation Needs: No Transportation Needs  . Lack of Transportation (Medical): No  . Lack of Transportation (Non-Medical): No  Physical Activity: Inactive  . Days of Exercise per Week: 0 days  . Minutes of Exercise per Session: 0 min  Stress: Stress Concern Present  . Feeling of Stress : To some extent  Social Connections:   . Frequency of Communication with Friends and Family:   . Frequency of Social Gatherings with Friends and Family:   . Attends Religious Services:   . Active Member of Clubs or Organizations:   . Attends Archivist Meetings:   Marland Kitchen Marital Status:     Outpatient Encounter Medications as of 04/26/2019  Medication Sig  . acetaminophen (TYLENOL) 650 MG CR tablet Take 650 mg by mouth every 8 (eight) hours as needed for pain.   Marland Kitchen ADVAIR DISKUS 250-50 MCG/DOSE AEPB INHALE 1 PUFF INTO THE LUNGS 2 TIMES DAILY.  Marland Kitchen albuterol (PROVENTIL) (2.5 MG/3ML) 0.083% nebulizer solution Take 3 mLs (2.5 mg total) by nebulization every 6 (six) hours as needed for wheezing or shortness of breath.  Marland Kitchen alendronate (FOSAMAX) 70 MG tablet Take 1 tablet (70 mg total) by mouth once a week. Take with a full glass of water on an empty stomach. Mondays  . Cholecalciferol (VITAMIN D) 2000 units tablet Take 2,000 Units by mouth daily.  . mirtazapine (REMERON) 15 MG tablet Take 0.5-1 tablets (7.5-15 mg total) by mouth at bedtime. For sleep and appetite.  . sertraline (ZOLOFT) 50 MG tablet Take 1 tablet (50 mg total) by mouth daily. For anxiety.  . SYNTHROID 100 MCG tablet TAKE 1 TABLET BY MOUTH EVERY MORNING ON AN EMPTY STOMACH WITH WATER. NO FOOD OR OTHER MEDICATIONS FOR 30 MINUTES.  . vitamin B-12 (CYANOCOBALAMIN) 1000 MCG tablet Take 1,000 mcg by mouth daily.   No facility-administered encounter medications on file as of 04/26/2019.    Activities of Daily Living In your present  state of health, do you have any difficulty performing the following activities: 04/26/2019  Hearing? N  Vision? N  Difficulty concentrating or making decisions? N  Walking or climbing stairs? N  Dressing or bathing? N  Doing errands, shopping? N  Preparing Food and eating ? N  Using the Toilet? N  In the past six months, have you accidently leaked urine? Y  Comment wears pull ups  Do you have problems with loss of bowel control? Y  Comment wears pull ups  Managing your Medications? N  Managing your Finances? N  Housekeeping or managing your Housekeeping? N  Some recent data  might be hidden    Patient Care Team: Pleas Koch, NP as PCP - General (Internal Medicine)    Assessment:   This is a routine wellness examination for Mariya.  Exercise Activities and Dietary recommendations Current Exercise Habits: The patient does not participate in regular exercise at present, Exercise limited by: None identified  Goals    . Patient Stated     Starting 03/21/18, I will continue to take medications as prescribed.     . Patient Stated     04/26/2019, I will maintain and continue medications as prescribed.        Fall Risk Fall Risk  04/26/2019 03/21/2018  Falls in the past year? 1 1  Number falls in past yr: 1 1  Injury with Fall? 0 1  Risk for fall due to : Medication side effect;History of fall(s) Impaired balance/gait;Impaired mobility;History of fall(s)  Follow up Falls evaluation completed;Falls prevention discussed -   Is the patient's home free of loose throw rugs in walkways, pet beds, electrical cords, etc?   yes      Grab bars in the bathroom? yes      Handrails on the stairs?   no      Adequate lighting?   yes  Timed Get Up and Go performed: N/A  Depression Screen PHQ 2/9 Scores 04/26/2019 03/21/2018  PHQ - 2 Score 0 3  PHQ- 9 Score 0 10     Cognitive Function MMSE - Mini Mental State Exam 04/26/2019 03/21/2018  Orientation to time 5 3  Orientation to time  comments - disoriented to year despite cues given  Orientation to Place 5 5  Registration 3 3  Attention/ Calculation 5 0  Recall 3 3  Language- name 2 objects - 0  Language- repeat 1 1  Language- follow 3 step command - 2  Language- follow 3 step command-comments - unable to follow 1 step of 3 step command despite cues given  Language- read & follow direction - 0  Write a sentence - 0  Copy design - 0  Total score - 17  Mini Cog  Mini-Cog screen was completed. Maximum score is 22. A value of 0 denotes this part of the MMSE was not completed or the patient failed this part of the Mini-Cog screening.       Immunization History  Administered Date(s) Administered  . Influenza,inj,Quad PF,6+ Mos 09/14/2017  . Influenza-Unspecified 10/23/2012  . Zoster 06/12/2015    Qualifies for Shingles Vaccine: Yes  Screening Tests Health Maintenance  Topic Date Due  . TETANUS/TDAP  01/11/2020 (Originally 08/04/1947)  . PNA vac Low Risk Adult (1 of 2 - PCV13) 01/11/2020 (Originally 08/05/1995)  . INFLUENZA VACCINE  08/12/2019  . DEXA SCAN  Completed    Cancer Screenings: Lung: Low Dose CT Chest recommended if Age 31-80 years, 30 pack-year currently smoking OR have quit w/in 15 years. Patient does not qualify. Breast: Mammogram: no longer required   Bone Density/Dexa: completed 04/24/2019 per patient  Colorectal: no longer required  Additional Screenings:  Hepatitis C Screening: N/A     Plan:   Patient will maintain and continue medications as prescribed.   I have personally reviewed and noted the following in the patient's chart:   . Medical and social history . Use of alcohol, tobacco or illicit drugs  . Current medications and supplements . Functional ability and status . Nutritional status . Physical activity . Advanced directives . List of other physicians . Hospitalizations, surgeries, and ER  visits in previous 12 months . Vitals . Screenings to include cognitive,  depression, and falls . Referrals and appointments  In addition, I have reviewed and discussed with patient certain preventive protocols, quality metrics, and best practice recommendations. A written personalized care plan for preventive services as well as general preventive health recommendations were provided to patient.     Andrez Grime, LPN  [phone]

## 2019-04-26 NOTE — Progress Notes (Signed)
PCP notes:  Health Maintenance: No gaps noted   Abnormal Screenings: none   Patient concerns: Patient states that someone was suppose to start coming to her house and checking her oxygen levels. They need follow up on the status of this.   Nurse concerns: None   Next PCP appt: None

## 2019-04-26 NOTE — Telephone Encounter (Signed)
Spoken to patient's daughter and patient is doing well. Much improved. Yes, please send refills

## 2019-04-26 NOTE — Telephone Encounter (Signed)
Noted, refill sent to pharmacy. 

## 2019-04-30 ENCOUNTER — Ambulatory Visit
Admission: RE | Admit: 2019-04-30 | Discharge: 2019-04-30 | Disposition: A | Discharge status: Home / Self Care | Payer: Medicare Other | Source: Ambulatory Visit | Attending: Pulmonary Disease | Admitting: Pulmonary Disease

## 2019-04-30 ENCOUNTER — Other Ambulatory Visit: Payer: Self-pay

## 2019-04-30 DIAGNOSIS — R0602 Shortness of breath: Secondary | ICD-10-CM | POA: Diagnosis not present

## 2019-04-30 DIAGNOSIS — R9431 Abnormal electrocardiogram [ECG] [EKG]: Secondary | ICD-10-CM | POA: Insufficient documentation

## 2019-04-30 DIAGNOSIS — R0789 Other chest pain: Secondary | ICD-10-CM | POA: Insufficient documentation

## 2019-04-30 NOTE — Progress Notes (Signed)
*  PRELIMINARY RESULTS* Echocardiogram 2D Echocardiogram has been performed.  Sherrie Sport 04/30/2019, 11:27 AM

## 2019-05-04 ENCOUNTER — Other Ambulatory Visit: Payer: Self-pay

## 2019-05-04 ENCOUNTER — Encounter: Payer: Self-pay | Admitting: Internal Medicine

## 2019-05-04 ENCOUNTER — Ambulatory Visit (INDEPENDENT_AMBULATORY_CARE_PROVIDER_SITE_OTHER): Payer: Medicare Other | Admitting: Internal Medicine

## 2019-05-04 VITALS — BP 116/64 | HR 71 | Ht 60.0 in | Wt 97.4 lb

## 2019-05-04 DIAGNOSIS — R0602 Shortness of breath: Secondary | ICD-10-CM | POA: Diagnosis not present

## 2019-05-04 DIAGNOSIS — R0789 Other chest pain: Secondary | ICD-10-CM

## 2019-05-04 NOTE — Patient Instructions (Signed)
Medication Instructions:  Your physician recommends that you continue on your current medications as directed. Please refer to the Current Medication list given to you today.  *If you need a refill on your cardiac medications before your next appointment, please call your pharmacy*  Follow-Up: At [facility], you and your health needs are our priority.  As part of our continuing mission to provide you with exceptional heart care, we have created designated Provider Care Teams.  These Care Teams include your primary Cardiologist (physician) and Advanced Practice Providers (APPs -  Physician Assistants and Nurse Practitioners) who all work together to provide you with the care you need, when you need it.  We recommend signing up for the patient portal called "MyChart".  Sign up information is provided on this After Visit Summary.  MyChart is used to connect with patients for Virtual Visits (Telemedicine).  Patients are able to view lab/test results, encounter notes, upcoming appointments, etc.  Non-urgent messages can be sent to your provider as well.   To learn more about what you can do with MyChart, go to [email or web address].    Your next appointment:   3 month(s)  The format for your next appointment:   In Person  Provider:    You may see DR Harrell Gave END or one of the following Advanced Practice Providers on your designated Care Team:    Murray Hodgkins, NP  Christell Faith, PA-C  Marrianne Mood, PA-C

## 2019-05-04 NOTE — Progress Notes (Signed)
New Outpatient Visit Date: 05/04/2019  Referring Provider: Tyler Pita, MD [address] [place],  [place] [address]  Chief Complaint: Shortness of breath and chest pain  HPI:  [name] is a 86 y.o. female who is being seen today for the evaluation of chest pressure and dyspnea on exertion at the request of Dr. Patsey Berthold.  She was previously followed in our practice by Dr. Stanford Breed but has not been seen since 2010. She has a history of possible COPD, syncope, hypothyroidism, vitamin B12 deficiency, diverticulitis, arthritis, and anxiety.  [name] notes that she has been feeling short of breath for several months, if not longer.  She also has noted intermittent chest pressure in the past but feels like her breathing and chest discomfort have improved significantly since she began eating more and putting on weight.  She and her daughter attribute this improvement to mirtazapine.  The chest pressure would happen randomly and was not associated with exertion.  She is unable to characterize it further but has not felt any in several weeks.  Her dyspnea was often with activity or even when talking on the phone.  [name] denies a history of prior heart disease and testing.  She ambulates with a walker and has fallen several times, on average every 3 months or so.  These falls have led to back and rib injuries.  [name] notes that she sometimes feels "swimmy-headed" when standing up but does not feel like she is going to pass out.  [name] notes occasional dependent edema in her feet.  She has not had any palpitations, orthopnea, or PND.  --------------------------------------------------------------------------------------------------  Cardiovascular History & Procedures: Cardiovascular Problems:  Chest pressure and shortness of breath  Risk Factors:  Age greater than 65  Cath/PCI:  None  CV Surgery:  None  EP Procedures and  Devices:  None  Non-Invasive Evaluation(s):  TTE (04/30/2019): Normal LV size and wall thickness.  LVEF 55-60% with grade 1 diastolic dysfunction.  Normal RV size and function.  Aortic sclerosis without stenosis.  Recent CV Pertinent Labs: Lab Results  Component Value Date   CHOL 188 03/27/2019   HDL 57.30 03/27/2019   LDLCALC 121 (H) 03/27/2019   TRIG 50.0 03/27/2019   CHOLHDL 3 03/27/2019   INR 1.02 07/27/2017   K 3.6 03/27/2019   BUN 16 03/27/2019   CREATININE 0.87 03/27/2019    --------------------------------------------------------------------------------------------------  Past Medical History:  Diagnosis Date  . Anxiety   . Arthritis    "hands" (10/24/2012)  . B12 deficiency    "get shots twice/month" (10/24/2012)  . Bursitis   . Depression   . Diverticulitis   . Exertional shortness of breath   . Headache(784.0)    "weekly at least" (10/24/2012)  . Hypothyroidism   . Insomnia   . Ovarian cancer ([place])    mass hooked to sm. intestine/bladder  . Skin cancer of face   . Syncope    when neck is up or turned to side  . Syncope and collapse    "first time I ever I collapsed"; confirms h/o presyncopal events (10/24/2012)  . UTI (lower urinary tract infection)     Past Surgical History:  Procedure Laterality Date  . ABDOMINAL HYSTERECTOMY    . CATARACT EXTRACTION W/ INTRAOCULAR LENS  IMPLANT, BILATERAL Bilateral 2000's  . DILATION AND CURETTAGE OF UTERUS     several  . FOOT SURGERY Right 1943-1944   4 surgeries  . IR KYPHO THORACIC WITH BONE  BIOPSY  07/27/2017  . THYROIDECTOMY    . TONSILLECTOMY      Current Meds  Medication Sig  . acetaminophen (TYLENOL) 650 MG CR tablet Take 650 mg by mouth every 8 (eight) hours as needed for pain.   Marland Kitchen ADVAIR DISKUS 250-50 MCG/DOSE AEPB INHALE 1 PUFF INTO THE LUNGS 2 TIMES DAILY.  Marland Kitchen albuterol (PROVENTIL) (2.5 MG/3ML) 0.083% nebulizer solution Take 3 mLs (2.5 mg total) by nebulization every 6 (six) hours as needed for  wheezing or shortness of breath.  Marland Kitchen alendronate (FOSAMAX) 70 MG tablet Take 1 tablet (70 mg total) by mouth once a week. Take with a full glass of water on an empty stomach. Mondays  . Cholecalciferol (VITAMIN D) 2000 units tablet Take 2,000 Units by mouth daily.  . mirtazapine (REMERON) 15 MG tablet TAKE 1/2 TO 1 TABLET BY MOUTH AT BEDTIME FOR SLEEP AND APPETITE.  Marland Kitchen sertraline (ZOLOFT) 50 MG tablet Take 1 tablet (50 mg total) by mouth daily. For anxiety.  . SYNTHROID 100 MCG tablet TAKE 1 TABLET BY MOUTH EVERY MORNING ON AN EMPTY STOMACH WITH WATER. NO FOOD OR OTHER MEDICATIONS FOR 30 MINUTES.  . vitamin B-12 (CYANOCOBALAMIN) 1000 MCG tablet Take 1,000 mcg by mouth daily.    Allergies: Penicillins and Prednisone  Social History   Tobacco Use  . Smoking status: Never Smoker  . Smokeless tobacco: Never Used  Substance Use Topics  . Alcohol use: No  . Drug use: No    Family History  Problem Relation Age of Onset  . Breast cancer Mother   . Heart Problems Father   . Heart attack Father     Review of Systems: A 12-system review of systems was performed and was negative except as noted in the HPI.  --------------------------------------------------------------------------------------------------  Physical Exam: BP 116/64 (BP Location: Left Arm, Patient Position: Sitting, Cuff Size: Normal)   Pulse 71   Ht 5' (1.524 m)   Wt 97 lb 6 oz (44.2 kg)   SpO2 98%   BMI 19.02 kg/m   General:  NAD.  Accompanied by her daughter. HEENT: No conjunctival pallor or scleral icterus. Facemask in place. Neck: Supple without lymphadenopathy, thyromegaly, JVD, or HJR. No carotid bruit. Lungs: Normal work of breathing. Clear to auscultation bilaterally without wheezes or crackles. Heart: Regular rate and rhythm without murmurs, rubs, or gallops. Non-displaced PMI. Abd: Bowel sounds present. Soft, NT/ND without hepatosplenomegaly Ext: No lower extremity edema. Radial, PT, and DP pulses are 2+  bilaterally Skin: Warm and dry without rash. Neuro: CNIII-XII intact. No focal deficits. Psych: Normal mood and affect.  EKG:  NSR without abnormality.  Lab Results  Component Value Date   WBC 5.7 03/27/2019   HGB 10.7 (L) 03/27/2019   HCT 31.7 (L) 03/27/2019   MCV 94.0 03/27/2019   PLT 213.0 03/27/2019    Lab Results  Component Value Date   NA 138 03/27/2019   K 3.6 03/27/2019   CL 103 03/27/2019   CO2 29 03/27/2019   BUN 16 03/27/2019   CREATININE 0.87 03/27/2019   GLUCOSE 87 03/27/2019   ALT 7 03/27/2019    Lab Results  Component Value Date   CHOL 188 03/27/2019   HDL 57.30 03/27/2019   LDLCALC 121 (H) 03/27/2019   TRIG 50.0 03/27/2019   CHOLHDL 3 03/27/2019     --------------------------------------------------------------------------------------------------  ASSESSMENT AND PLAN: Dyspnea on exertion and chest pressure: These symptoms seem to have been longstanding and have actually improved over the last few weeks since Ms.  Edwards was started on mirtazapine.  Exam and EKG today are unremarkable.  Recent echo also showed normal LVEF with grade 1 diastolic dysfunction and aortic sclerosis.  While [name] certainly could have underlying CAD leading to some of her symptoms, we have agreed to defer additional testing/intervention at this time given recent improvement in her symptoms, as well as her age and frailty with falls every few months (some of which have led to back and rib injuries).  If her symptoms worsen, we would consider a myocardial perfusion stress test to exclude high-risk ischemia.  I would like to avoid invasive procedures, if possible.  Follow-up: Return to clinic in 3 months.  Nelva Bush, MD 05/05/2019 1:41 PM

## 2019-05-05 ENCOUNTER — Encounter: Payer: Self-pay | Admitting: Internal Medicine

## 2019-05-06 ENCOUNTER — Encounter: Payer: Self-pay | Admitting: Pulmonary Disease

## 2019-05-08 DIAGNOSIS — R0602 Shortness of breath: Secondary | ICD-10-CM | POA: Diagnosis not present

## 2019-05-08 NOTE — Progress Notes (Signed)
Appreciate your input thank you so much.

## 2019-05-09 ENCOUNTER — Telehealth: Payer: Self-pay | Admitting: Pulmonary Disease

## 2019-05-09 NOTE — Telephone Encounter (Signed)
ONO on RA done by [facility] 05/06/19  Per Dr Patsey Berthold- good news, o2 stays good during sleep. No need for o2.  I called and spoke with the pt's daughter, Sunday Spillers and notified of these recs  She verbalized understanding and will inform the pt

## 2019-05-15 ENCOUNTER — Other Ambulatory Visit: Payer: Self-pay

## 2019-05-15 ENCOUNTER — Encounter: Payer: Self-pay | Admitting: Pulmonary Disease

## 2019-05-15 ENCOUNTER — Ambulatory Visit: Payer: Medicare Other | Admitting: Pulmonary Disease

## 2019-05-15 VITALS — BP 118/70 | HR 70 | Temp 98.2°F | Ht 60.0 in | Wt 196.8 lb

## 2019-05-15 DIAGNOSIS — R5383 Other fatigue: Secondary | ICD-10-CM

## 2019-05-15 DIAGNOSIS — R0602 Shortness of breath: Secondary | ICD-10-CM

## 2019-05-15 NOTE — Patient Instructions (Signed)
You seem to be doing well continue what you are doing.  We will see you in follow-up as needed.

## 2019-05-15 NOTE — Progress Notes (Signed)
    Assessment & Plan:  1. Shortness of breath (Primary)  2. Other fatigue   Patient Instructions  You seem to be doing well continue what you are doing.  We will see you in follow-up as needed.   Please note: late entry documentation due to logistical difficulties during COVID-19 pandemic. This note is filed for information purposes only, and is not intended to be used for billing, nor does it represent the full scope/nature of the visit in question. Please see any associated scanned media linked to date of encounter for additional pertinent information.  Subjective:    HPI: [name] is a 86 y.o. female presenting to the pulmonology clinic on 05/15/2019 with report of: Follow-up (Patient is feeling better since visit last month. Patient and daughter state that patient has improved. Recent ONO was good. )     Outpatient Encounter Medications as of 05/15/2019  Medication Sig   acetaminophen  (TYLENOL ) 650 MG CR tablet Take 1,300 mg by mouth every 8 (eight) hours as needed for pain.   Cholecalciferol (VITAMIN D ) 2000 units tablet Take 2,000 Units by mouth daily.   [DISCONTINUED] ADVAIR  DISKUS 250-50 MCG/DOSE AEPB INHALE 1 PUFF INTO THE LUNGS 2 TIMES DAILY.   [DISCONTINUED] albuterol  (PROVENTIL ) (2.5 MG/3ML) 0.083% nebulizer solution Take 3 mLs (2.5 mg total) by nebulization every 6 (six) hours as needed for wheezing or shortness of breath.   [DISCONTINUED] alendronate  (FOSAMAX ) 70 MG tablet Take 1 tablet (70 mg total) by mouth once a week. Take with a full glass of water on an empty stomach. Mondays   [DISCONTINUED] mirtazapine  (REMERON ) 15 MG tablet TAKE 1/2 TO 1 TABLET BY MOUTH AT BEDTIME FOR SLEEP AND APPETITE.   [DISCONTINUED] sertraline  (ZOLOFT ) 50 MG tablet Take 1 tablet (50 mg total) by mouth daily. For anxiety.   [DISCONTINUED] SYNTHROID  100 MCG tablet TAKE 1 TABLET BY MOUTH EVERY MORNING ON AN EMPTY STOMACH WITH WATER. NO FOOD OR OTHER MEDICATIONS FOR 30 MINUTES.    [DISCONTINUED] vitamin B-12 (CYANOCOBALAMIN ) 1000 MCG tablet Take 1,000 mcg by mouth daily.   No facility-administered encounter medications on file as of 05/15/2019.      Objective:   Vitals:   05/15/19 1534  BP: 118/70  Pulse: 70  Temp: 98.2 F (36.8 C)  Height: 5' (1.524 m)  Weight: 196 lb 12.8 oz (89.3 kg)  SpO2: 95%  TempSrc: Temporal  BMI (Calculated): 38.43     Physical exam documentation is limited by delayed entry of information.

## 2019-06-08 DIAGNOSIS — Z23 Encounter for immunization: Secondary | ICD-10-CM | POA: Diagnosis not present

## 2019-07-11 ENCOUNTER — Other Ambulatory Visit: Payer: Self-pay | Admitting: Primary Care

## 2019-07-11 DIAGNOSIS — E039 Hypothyroidism, unspecified: Secondary | ICD-10-CM

## 2019-07-23 NOTE — Progress Notes (Signed)
Cardiology Office Note    Date:  07/27/2019   ID:  KERRIA SAPIEN, DOB Oct 04, 1928, MRN [id number]  PCP:  Pleas Koch, NP  Cardiologist:  Nelva Bush, MD  Electrophysiologist:  None   Chief Complaint: Follow up  History of Present Illness:   [name] is a 86 y.o. female with history of possible COPD, syncope, hypothyroidism, vitamin B12 deficiency, diverticulitis, arthritis, and anxiety who presents for follow-up of dyspnea.   She was previously followed in our practice by Dr. Stanford Breed though had been lost to follow-up from 2010 until she was seen most recently in 04/2019 at the request of pulmonology for evaluation of chest pressure and exertional dyspnea.  Echo as obtained by pulmonology in 04/2019 demonstrated an EF of 55 to 60%, no regional wall motion abnormalities, grade 1 diastolic dysfunction, normal RV systolic function and RV cavity size, trivial aortic valve insufficiency, mild to moderate aortic valve sclerosis without stenosis.  At her visit with Dr. Saunders Revel in 04/2019 she reported a several month history of intermittent chest pressure and shortness of breath.  She noted the symptoms seem to have improved some after she began eating more, and putting on more weight.  Both the patient and daughter attributed this to mirtazapine.  She noted her chest pressure would happen randomly though had not had any in several weeks prior to her visit at that time.  She noted her dyspnea was often with activity or even while talking on the phone.  Lastly, she noted some positional dizziness.  Given recent improvement in symptoms, advanced aged, frail state, occasional falls, and in the setting of her recent echo demonstrating preserved LVSF additional testing was deferred at that time.  She comes in accompanied by her daughter today and is doing quite well from a cardiac perspective.  With continued weight gain she has not had any further dyspnea.  She does note an occasional randomly  occurring chest tightness, particularly in the evening hours which will be short-lived lasting for only several seconds with spontaneous resolution.  She does note some mild bilateral ankle swelling which is typically worse throughout the day and improved in the morning.  She continues to have an improved appetite on mirtazapine and is also supplementing caloric intake with milk shakes and banana splits.  Her blood pressure remains well controlled.  Both the patient and daughter are pleased with her improvement.  She has not had any falls since she was last seen.   Labs independently reviewed: 03/2019 - TSH 9.71, potassium 3.6, BUN 16, SCr 0.87, albumin 4.1, AST/ALT normal, HGB 10.7, PLT 213, TC 188, TG 50, HDL 57, LDL 121  Past Medical History:  Diagnosis Date   Anxiety    Arthritis    "hands" (10/24/2012)   B12 deficiency    "get shots twice/month" (10/24/2012)   Bursitis    Depression    Diverticulitis    Exertional shortness of breath    Headache(784.0)    "weekly at least" (10/24/2012)   Hypothyroidism    Insomnia    Ovarian cancer ([place])    mass hooked to sm. intestine/bladder   Skin cancer of face    Syncope    when neck is up or turned to side   Syncope and collapse    "first time I ever I collapsed"; confirms h/o presyncopal events (10/24/2012)   UTI (lower urinary tract infection)     Past Surgical History:  Procedure Laterality Date   ABDOMINAL HYSTERECTOMY  CATARACT EXTRACTION W/ INTRAOCULAR LENS  IMPLANT, BILATERAL Bilateral 2000's   DILATION AND CURETTAGE OF UTERUS     several   FOOT SURGERY Right 1943-1944   4 surgeries   IR KYPHO THORACIC WITH BONE BIOPSY  07/27/2017   THYROIDECTOMY     TONSILLECTOMY      Current Medications: Current Meds  Medication Sig   acetaminophen (TYLENOL) 650 MG CR tablet Take 650 mg by mouth every 8 (eight) hours as needed for pain.    ADVAIR DISKUS 250-50 MCG/DOSE AEPB INHALE 1 PUFF INTO THE LUNGS 2  TIMES DAILY.   albuterol (PROVENTIL) (2.5 MG/3ML) 0.083% nebulizer solution Take 3 mLs (2.5 mg total) by nebulization every 6 (six) hours as needed for wheezing or shortness of breath.   alendronate (FOSAMAX) 70 MG tablet Take 1 tablet (70 mg total) by mouth once a week. Take with a full glass of water on an empty stomach. Mondays   Cholecalciferol (VITAMIN D) 2000 units tablet Take 2,000 Units by mouth daily.   mirtazapine (REMERON) 15 MG tablet TAKE 1/2 TO 1 TABLET BY MOUTH AT BEDTIME FOR SLEEP AND APPETITE.   sertraline (ZOLOFT) 50 MG tablet Take 1 tablet (50 mg total) by mouth daily. For anxiety.   SYNTHROID 100 MCG tablet TAKE 1 TABLET BY MOUTH EVERY MORNING ON AN EMPTY STOMACH WITH WATER. NO FOOD OR OTHER MEDICATIONS FOR 30 MINUTES.   vitamin B-12 (CYANOCOBALAMIN) 1000 MCG tablet Take 1,000 mcg by mouth daily.    Allergies:   Penicillins and Prednisone   Social History   Socioeconomic History   Marital status: Widowed    Spouse name: Not on file   Number of children: Not on file   Years of education: Not on file   Highest education level: Not on file  Occupational History   Not on file  Tobacco Use   Smoking status: Never Smoker   Smokeless tobacco: Never Used  Vaping Use   Vaping Use: Never used  Substance and Sexual Activity   Alcohol use: No   Drug use: No   Sexual activity: Never  Other Topics Concern   Not on file  Social History Narrative   Not on file   Social Determinants of Health   Financial Resource Strain: Low Risk    Difficulty of Paying Living Expenses: Not hard at all  Food Insecurity: No Food Insecurity   Worried About Charity fundraiser in the Last Year: Never true   [place] in the Last Year: Never true  Transportation Needs: No Transportation Needs   Lack of Transportation (Medical): No   Lack of Transportation (Non-Medical): No  Physical Activity: Inactive   Days of Exercise per Week: 0 days   Minutes of  Exercise per Session: 0 min  Stress: Stress Concern Present   Feeling of Stress : To some extent  Social Connections:    Frequency of Communication with Friends and Family:    Frequency of Social Gatherings with Friends and Family:    Attends Religious Services:    Active Member of Clubs or Organizations:    Attends Archivist Meetings:    Marital Status:      Family History:  The patient's family history includes Breast cancer in her mother; Heart Problems in her father; Heart attack in her father.  ROS:   Review of Systems  Constitutional: Negative for chills, diaphoresis, fever, malaise/fatigue and weight loss.  HENT: Negative for congestion.   Eyes: Negative for discharge  and redness.  Respiratory: Negative for cough, sputum production, shortness of breath and wheezing.   Cardiovascular: Positive for leg swelling. Negative for chest pain, palpitations, orthopnea, claudication and PND.  Gastrointestinal: Negative for abdominal pain, heartburn, nausea and vomiting.  Musculoskeletal: Negative for falls and myalgias.  Skin: Negative for rash.  Neurological: Negative for dizziness, tingling, tremors, sensory change, speech change, focal weakness, loss of consciousness and weakness.  Endo/Heme/Allergies: Does not bruise/bleed easily.  Psychiatric/Behavioral: Negative for substance abuse. The patient is not nervous/anxious.   All other systems reviewed and are negative.    EKGs/Labs/Other Studies Reviewed:    Studies reviewed were summarized above. The additional studies were reviewed today:  2D echo 04/2019: 1. Left ventricular ejection fraction, by estimation, is 55 to 60%. The  left ventricle has normal function. The left ventricle has no regional  wall motion abnormalities. Left ventricular diastolic parameters are  consistent with Grade I diastolic  dysfunction (impaired relaxation).  2. Right ventricular systolic function is normal. The right ventricular    size is normal. Tricuspid regurgitation signal is inadequate for assessing  PA pressure.  3. The mitral valve is normal in structure. No evidence of mitral valve  regurgitation. No evidence of mitral stenosis.  4. The aortic valve is normal in structure. Aortic valve regurgitation is  trivial. Mild to moderate aortic valve sclerosis/calcification is present,  without any evidence of aortic stenosis.  5. The inferior vena cava is normal in size with <50% respiratory  variability, suggesting right atrial pressure of 8 mmHg.   EKG:  EKG is ordered today.  The EKG ordered today demonstrates NSR, 75 bpm, poor R wave progression of the precordial leads, no acute ST-T changes, largely unchanged when compared to prior study  Recent Labs: 03/27/2019: ALT 7; BUN 16; Creatinine, Ser 0.87; Hemoglobin 10.7; Platelets 213.0; Potassium 3.6; Sodium 138; TSH 9.71  Recent Lipid Panel    Component Value Date/Time   CHOL 188 03/27/2019 1616   TRIG 50.0 03/27/2019 1616   HDL 57.30 03/27/2019 1616   CHOLHDL 3 03/27/2019 1616   VLDL 10.0 03/27/2019 1616   LDLCALC 121 (H) 03/27/2019 1616    PHYSICAL EXAM:    VS:  BP 120/62 (BP Location: Left Arm, Patient Position: Sitting, Cuff Size: Normal)    Pulse 75    Ht 5' (1.524 m)    Wt 103 lb 4 oz (46.8 kg)    SpO2 98%    BMI 20.16 kg/m   BMI: Body mass index is 20.16 kg/m.  Physical Exam Constitutional:      Appearance: She is well-developed.  HENT:     Head: Normocephalic and atraumatic.  Eyes:     General:        Right eye: No discharge.        Left eye: No discharge.  Neck:     Vascular: No JVD.  Cardiovascular:     Rate and Rhythm: Normal rate and regular rhythm.     Pulses: No midsystolic click and no opening snap.          Posterior tibial pulses are 2+ on the right side and 2+ on the left side.     Heart sounds: Normal heart sounds, S1 normal and S2 normal. Heart sounds not distant. No murmur heard.  No friction rub.  Pulmonary:      Effort: Pulmonary effort is normal. No respiratory distress.     Breath sounds: Normal breath sounds. No decreased breath sounds, wheezing or rales.  Chest:  Chest wall: No tenderness.  Abdominal:     General: There is no distension.     Palpations: Abdomen is soft.     Tenderness: There is no abdominal tenderness.  Musculoskeletal:     Cervical back: Normal range of motion.     Left lower leg: Edema present.     Comments: Trace bilateral ankle edema  Skin:    General: Skin is warm and dry.     Nails: There is no clubbing.  Neurological:     Mental Status: She is alert and oriented to person, place, and time.  Psychiatric:        Speech: Speech normal.        Behavior: Behavior normal.        Thought Content: Thought content normal.        Judgment: Judgment normal.     Wt Readings from Last 3 Encounters:  07/27/19 103 lb 4 oz (46.8 kg)  05/15/19 196 lb 12.8 oz (89.3 kg)  05/04/19 97 lb 6 oz (44.2 kg)     ASSESSMENT & PLAN:   1. DOE/chest pressure/lower extremity swelling: Symptoms continue to be improved following addition of mirtazapine with weight gain and increase in stamina.  She did have some diastolic dysfunction noted on echo though this does not necessitate standing medical therapy at this time.  Recommend she elevate her legs when sitting at home as I suspect a fair component of her bilateral ankle edema is related to venous insufficiency.  Would not use diuretic therapy in an effort to prevent dehydration and orthostasis with potential subsequent fall.  Her randomly occurring chest tightness is relatively atypical and presentation and given improved symptoms with reassuring recent echo no plans for ischemic evaluation at this time.  Disposition: F/u with Dr. Saunders Revel or an APP in 12 months, sooner if needed.   Medication Adjustments/Labs and Tests Ordered: Current medicines are reviewed at length with the patient today.  Concerns regarding medicines are outlined above.  Medication changes, Labs and Tests ordered today are summarized above and listed in the Patient Instructions accessible in Encounters.   Signed, Christell Faith, PA-C 07/27/2019 2:17 PM     [address] [place], [place] [address] [phone]

## 2019-07-27 ENCOUNTER — Encounter: Payer: Self-pay | Admitting: Physician Assistant

## 2019-07-27 ENCOUNTER — Ambulatory Visit (INDEPENDENT_AMBULATORY_CARE_PROVIDER_SITE_OTHER): Payer: Medicare Other | Admitting: Physician Assistant

## 2019-07-27 ENCOUNTER — Other Ambulatory Visit: Payer: Self-pay

## 2019-07-27 VITALS — BP 120/62 | HR 75 | Ht 60.0 in | Wt 103.2 lb

## 2019-07-27 DIAGNOSIS — R0789 Other chest pain: Secondary | ICD-10-CM | POA: Diagnosis not present

## 2019-07-27 DIAGNOSIS — R0609 Other forms of dyspnea: Secondary | ICD-10-CM

## 2019-07-27 DIAGNOSIS — R0600 Dyspnea, unspecified: Secondary | ICD-10-CM | POA: Diagnosis not present

## 2019-07-27 DIAGNOSIS — M25473 Effusion, unspecified ankle: Secondary | ICD-10-CM | POA: Diagnosis not present

## 2019-07-27 NOTE — Patient Instructions (Signed)
Medication Instructions:  Your physician recommends that you continue on your current medications as directed. Please refer to the Current Medication list given to you today.  *If you need a refill on your cardiac medications before your next appointment, please call your pharmacy*   Lab Work: None Ordered If you have labs (blood work) drawn today and your tests are completely normal, you will receive your results only by: Marland Kitchen MyChart Message (if you have MyChart) OR . A paper copy in the mail If you have any lab test that is abnormal or we need to change your treatment, we will call you to review the results.   Testing/Procedures: None Ordered   Follow-Up: At [facility], you and your health needs are our priority.  As part of our continuing mission to provide you with exceptional heart care, we have created designated Provider Care Teams.  These Care Teams include your primary Cardiologist (physician) and Advanced Practice Providers (APPs -  Physician Assistants and Nurse Practitioners) who all work together to provide you with the care you need, when you need it.  We recommend signing up for the patient portal called "MyChart".  Sign up information is provided on this After Visit Summary.  MyChart is used to connect with patients for Virtual Visits (Telemedicine).  Patients are able to view lab/test results, encounter notes, upcoming appointments, etc.  Non-urgent messages can be sent to your provider as well.   To learn more about what you can do with MyChart, go to [email or web address].    Your next appointment:   12 month(s)  The format for your next appointment:   In Person  Provider:    You may see Nelva Bush, MD or one of the following Advanced Practice Providers on your designated Care Team:    Murray Hodgkins, NP  Christell Faith, PA-C  Marrianne Mood, PA-C  Arva Chafe, NP    Other Instructions N/A

## 2019-08-06 DIAGNOSIS — H04123 Dry eye syndrome of bilateral lacrimal glands: Secondary | ICD-10-CM | POA: Diagnosis not present

## 2019-08-06 DIAGNOSIS — Z961 Presence of intraocular lens: Secondary | ICD-10-CM | POA: Diagnosis not present

## 2019-08-06 DIAGNOSIS — H524 Presbyopia: Secondary | ICD-10-CM | POA: Diagnosis not present

## 2019-08-06 DIAGNOSIS — H26493 Other secondary cataract, bilateral: Secondary | ICD-10-CM | POA: Diagnosis not present

## 2019-10-11 ENCOUNTER — Other Ambulatory Visit: Payer: Self-pay | Admitting: Primary Care

## 2019-10-11 DIAGNOSIS — E039 Hypothyroidism, unspecified: Secondary | ICD-10-CM

## 2019-10-12 ENCOUNTER — Other Ambulatory Visit (INDEPENDENT_AMBULATORY_CARE_PROVIDER_SITE_OTHER): Payer: Medicare Other

## 2019-10-12 ENCOUNTER — Other Ambulatory Visit: Payer: Self-pay

## 2019-10-12 DIAGNOSIS — E039 Hypothyroidism, unspecified: Secondary | ICD-10-CM | POA: Diagnosis not present

## 2019-10-12 NOTE — Addendum Note (Signed)
Addended by: Cloyd Stagers on: 10/12/2019 03:54 PM   Modules accepted: Orders

## 2019-10-13 LAB — TSH: TSH: 7.45 mIU/L — ABNORMAL HIGH (ref 0.40–4.50)

## 2019-10-15 ENCOUNTER — Other Ambulatory Visit: Payer: Self-pay

## 2019-10-15 DIAGNOSIS — E039 Hypothyroidism, unspecified: Secondary | ICD-10-CM

## 2019-10-15 MED ORDER — LEVOTHYROXINE SODIUM 125 MCG PO TABS: 125.0000 ug | ORAL_TABLET | Freq: Every day | ORAL | 1 refills | Status: DC

## 2019-10-22 ENCOUNTER — Other Ambulatory Visit: Payer: Self-pay | Admitting: Primary Care

## 2019-10-22 DIAGNOSIS — F419 Anxiety disorder, unspecified: Secondary | ICD-10-CM

## 2019-10-23 ENCOUNTER — Other Ambulatory Visit: Payer: Self-pay | Admitting: Primary Care

## 2019-10-23 DIAGNOSIS — F419 Anxiety disorder, unspecified: Secondary | ICD-10-CM

## 2019-10-23 DIAGNOSIS — R630 Anorexia: Secondary | ICD-10-CM

## 2019-10-23 DIAGNOSIS — G4700 Insomnia, unspecified: Secondary | ICD-10-CM

## 2019-10-23 DIAGNOSIS — F32A Depression, unspecified: Secondary | ICD-10-CM

## 2019-10-23 MED ORDER — LEVOTHYROXINE SODIUM 112 MCG PO TABS: ORAL_TABLET | ORAL | 1 refills | Status: DC

## 2019-10-23 NOTE — Telephone Encounter (Signed)
Please notify patient's daughter that I sent in brand Synthroid at the 112 mcg dose.  I feel like the 125 mcg dose would have been a little too high for her anyway.  Repeat labs due in 2 months.

## 2019-10-23 NOTE — Telephone Encounter (Signed)
Pt's daughter called back stating the rx for her thyroid medication needs to be name brand Synthroid and not generic levothyroxine. She does not do well on generic. She said she is tired and does not feel well. The dosage was also change recently also.

## 2019-10-23 NOTE — Addendum Note (Signed)
Addended by: Pleas Koch on: 10/23/2019 06:37 PM   Modules accepted: Orders

## 2019-10-24 NOTE — Telephone Encounter (Signed)
LAST APPOINTMENT DATE: 04/26/2019   NEXT APPOINTMENT DATE: has lab and health coach but no f/u with you     LAST REFILL: 03/27/2019  QTY:90 with 1 rf

## 2019-10-24 NOTE — Telephone Encounter (Signed)
Notified pt daugther regarding  Rx synthroid 112 mcg with instructions by Lonell Grandchild, NP

## 2019-10-24 NOTE — Telephone Encounter (Signed)
Refills sent to pharmacy. 

## 2019-10-31 ENCOUNTER — Telehealth: Payer: Self-pay

## 2019-10-31 NOTE — Telephone Encounter (Signed)
[facility] Night - Client TELEPHONE ADVICE RECORD [facility] Patient Name: [name] Gender: Female DOB: 09-13-1928 Age: 86 Y 2 M 24 D Return Phone Number: [phone] (Primary) Address: City/State/Zip: [place] [address] Client [facility] - Client Client Site [facility] Physician Alma Friendly - NP Contact Type Call Who Is Calling Patient / Member / Family / Caregiver Call Type Triage / Clinical Caller Name Bevelyn Buckles Relationship To Patient Daughter Return Phone Number [phone] (Primary) Chief Complaint Prescription Refill or Medication Request (non symptomatic) Reason for Call Medication Question / Request Initial Comment Caller states she called the other day about her mother. She cannot take the generic of Synthroid and when she picked up the prescription it was the generic. Requesting the prescription be rewritten for the brand name of Synthroid, as the generic does not work for her. Translation No Nurse Assessment Nurse: Junius Creamer, RN, Debra Date/Time (Eastern Time): 10/30/2019 5:32:15 PM Confirm and document reason for call. If symptomatic, describe symptoms. ---Caller states called the other day about her mother. can't take generic Synthroid. when she picked up rx it was generic. Req'd the rx be rewritten for the brand name Synthroid, as the generic does not work for her. hasn't been feeling good past few days. has been giving her the generic because she had nothing else. is tired and doesn't feel good. Does the patient have any new or worsening symptoms? ---Yes Will a triage be completed? ---Yes Related visit to physician within the last 2 weeks? ---No Does the PT have any chronic conditions? (i.e. diabetes, asthma, this includes High risk factors for pregnancy, etc.) ---Yes List chronic conditions. ---low thyroid Is this a behavioral health or substance abuse call?  ---No Guidelines Guideline Title Affirmed Question Affirmed Notes Nurse Date/Time (Eastern Time) Weakness (Generalized) and Fatigue [1] Fatigue (i.e., tires easily, decreased energy) AND [2] persists > 1 week Junius Creamer, Therapist, sports, Hilda Blades 10/30/2019 5:37:05 PM PLEASE NOTE: All timestamps contained within this report are represented as [place] Standard Time. CONFIDENTIALTY NOTICE: This fax transmission is intended only for the addressee. It contains information that is legally privileged, confidential or otherwise protected from use or disclosure. If you are not the intended recipient, you are strictly prohibited from reviewing, disclosing, copying using or disseminating any of this information or taking any action in reliance on or regarding this information. If you have received this fax in error, please notify us immediately by telephone so that we can arrange for its return to [place]. Phone: [phone], Toll-Free: [phone], Fax: [phone] Page: 2 of 2 Call Id: [id number] [place]. Time Eilene Ghazi Time) Disposition Final User 10/30/2019 5:40:36 PM SEE PCP WITHIN 3 DAYS Yes Junius Creamer, RN, Hilda Blades Caller Disagree/Comply Comply Caller Understands Yes PreDisposition Home Care Care Advice Given Per Guideline SEE PCP WITHIN 3 DAYS: * You need to be seen within 2 or 3 days. * PCP VISIT: Call your doctor (or NP/PA) during regular office hours and make an appointment. A clinic or urgent care center are good places to go for care if your doctor's office is closed or you can't get an appointment. NOTE: If office will be open tomorrow, tell caller to call then, not in 3 days. CALL BACK IF: * You become worse CARE ADVICE given per Weakness and Fatigue (Adult) guideline. Comments User: Sandi Carne, RN Date/Time Eilene Ghazi Time): 10/30/2019 5:41:21 PM advised unable to assist w/ med after hours. advised will send message to office but to contact office  tomorrow am to verify they rec'd message. verbalizes  understanding.

## 2019-12-04 ENCOUNTER — Other Ambulatory Visit: Payer: Self-pay

## 2019-12-04 ENCOUNTER — Encounter: Payer: Self-pay | Admitting: Primary Care

## 2019-12-04 ENCOUNTER — Ambulatory Visit (INDEPENDENT_AMBULATORY_CARE_PROVIDER_SITE_OTHER): Payer: Medicare Other | Admitting: Primary Care

## 2019-12-04 VITALS — BP 120/62 | HR 67 | Temp 98.3°F | Ht 60.0 in | Wt 104.0 lb

## 2019-12-04 DIAGNOSIS — F32A Depression, unspecified: Secondary | ICD-10-CM

## 2019-12-04 DIAGNOSIS — D649 Anemia, unspecified: Secondary | ICD-10-CM | POA: Diagnosis not present

## 2019-12-04 DIAGNOSIS — F419 Anxiety disorder, unspecified: Secondary | ICD-10-CM | POA: Diagnosis not present

## 2019-12-04 DIAGNOSIS — E039 Hypothyroidism, unspecified: Secondary | ICD-10-CM | POA: Diagnosis not present

## 2019-12-04 DIAGNOSIS — R531 Weakness: Secondary | ICD-10-CM | POA: Diagnosis not present

## 2019-12-04 LAB — CBC
HCT: 33.8 % — ABNORMAL LOW (ref 36.0–46.0)
Hemoglobin: 11.3 g/dL — ABNORMAL LOW (ref 12.0–15.0)
MCHC: 33.4 g/dL (ref 30.0–36.0)
MCV: 91.5 fl (ref 78.0–100.0)
Platelets: 247 10*3/uL (ref 150.0–400.0)
RBC: 3.69 Mil/uL — ABNORMAL LOW (ref 3.87–5.11)
RDW: 14.3 % (ref 11.5–15.5)
WBC: 4 10*3/uL (ref 4.0–10.5)

## 2019-12-04 LAB — BASIC METABOLIC PANEL
BUN: 18 mg/dL (ref 6–23)
CO2: 26 mEq/L (ref 19–32)
Calcium: 9.4 mg/dL (ref 8.4–10.5)
Chloride: 102 mEq/L (ref 96–112)
Creatinine, Ser: 0.7 mg/dL (ref 0.40–1.20)
GFR: 76.73 mL/min (ref >60.00)
Glucose, Bld: 90 mg/dL (ref 70–99)
Potassium: 4.2 mEq/L (ref 3.5–5.1)
Sodium: 135 mEq/L (ref 135–145)

## 2019-12-04 LAB — TSH: TSH: 0.15 u[IU]/mL — ABNORMAL LOW (ref 0.35–4.50)

## 2019-12-04 MED ORDER — SERTRALINE HCL 100 MG PO TABS: 100.0000 mg | ORAL_TABLET | Freq: Every day | ORAL | 1 refills | Status: DC

## 2019-12-04 NOTE — Assessment & Plan Note (Signed)
Chronic, but acute for the last several weeks. She is stable today with her walker.   Checking labs today including TSH, CBC, BMP. Offered home health PT for balance and strengthening, she kindly declines.  Will also work to treat anxiety.

## 2019-12-04 NOTE — Progress Notes (Signed)
Subjective:    Patient ID: [name], female    DOB: 02-15-1928, 86 y.o.   MRN: [id number]  HPI  This visit occurred during the SARS-CoV-2 public health emergency.  Safety protocols were in place, including screening questions prior to the visit, additional usage of staff PPE, and extensive cleaning of exam room while observing appropriate contact time as indicated for disinfecting solutions.   [name] is a 86 year old female with a history of hypothyroidism, COPD, orthostatic hypotension, osteoporosis, anxiety and depression, urinary incontinence, insomnia who presents today with a chief complaint of fatigue.  Today she endorses weakness that began when generic levothyroxine was sent into the pharmacy in October 2021. Brand Synthroid was sent to her pharmacy in late October 2021.   She is currently managed on Synthroid 112 mcg which was increased from 100 mcg and changed from generic levothyroxine in October 2021. TSH at the time was 7.45 and she was taking correctly. She is due for repeat TSH check today.  She wakes up at least once nightly to urinate, falls back asleep quickly, overall feels that she sleep well.  Also with decreased appetite, but is eating two moderately sized meals daily. She endorses drinking water daily.   She has noticed that she requires use of her walker daily due to imbalance. She continues to assist with household chores like she did before. Her daughter has noticed that she is more anxious than usual since her son fractured his left hip. She lives with her son and he cannot tend to the house as he once did. Her daughter senses that she is more worried about her son than anything.  Overall Zoloft 50 mg has helped with anxiety, but her daughter believes she may need a dose increase.   BP Readings from Last 3 Encounters:  12/04/19 120/62  07/27/19 120/62  05/15/19 118/70     Review of Systems  Constitutional: Positive for fatigue.  Respiratory:  Negative for shortness of breath.   Cardiovascular: Negative for chest pain.  Gastrointestinal: Negative for abdominal pain.  Psychiatric/Behavioral: The patient is nervous/anxious.        Past Medical History:  Diagnosis Date  . Anxiety   . Arthritis    "hands" (10/24/2012)  . B12 deficiency    "get shots twice/month" (10/24/2012)  . Bursitis   . Depression   . Diverticulitis   . Exertional shortness of breath   . Headache(784.0)    "weekly at least" (10/24/2012)  . Hypothyroidism   . Insomnia   . Ovarian cancer ([place])    mass hooked to sm. intestine/bladder  . Skin cancer of face   . Syncope    when neck is up or turned to side  . Syncope and collapse    "first time I ever I collapsed"; confirms h/o presyncopal events (10/24/2012)  . UTI (lower urinary tract infection)      Social History   Socioeconomic History  . Marital status: Widowed    Spouse name: Not on file  . Number of children: Not on file  . Years of education: Not on file  . Highest education level: Not on file  Occupational History  . Not on file  Tobacco Use  . Smoking status: Never Smoker  . Smokeless tobacco: Never Used  Vaping Use  . Vaping Use: Never used  Substance and Sexual Activity  . Alcohol use: No  . Drug use: No  . Sexual activity: Never  Other Topics Concern  .  Not on file  Social History Narrative  . Not on file   Social Determinants of Health   Financial Resource Strain: Low Risk   . Difficulty of Paying Living Expenses: Not hard at all  Food Insecurity: No Food Insecurity  . Worried About Charity fundraiser in the Last Year: Never true  . Ran Out of Food in the Last Year: Never true  Transportation Needs: No Transportation Needs  . Lack of Transportation (Medical): No  . Lack of Transportation (Non-Medical): No  Physical Activity: Inactive  . Days of Exercise per Week: 0 days  . Minutes of Exercise per Session: 0 min  Stress: Stress Concern Present  . Feeling of  Stress : To some extent  Social Connections:   . Frequency of Communication with Friends and Family: Not on file  . Frequency of Social Gatherings with Friends and Family: Not on file  . Attends Religious Services: Not on file  . Active Member of Clubs or Organizations: Not on file  . Attends Archivist Meetings: Not on file  . Marital Status: Not on file  Intimate Partner Violence: Not At Risk  . Fear of Current or Ex-Partner: No  . Emotionally Abused: No  . Physically Abused: No  . Sexually Abused: No    Past Surgical History:  Procedure Laterality Date  . ABDOMINAL HYSTERECTOMY    . CATARACT EXTRACTION W/ INTRAOCULAR LENS  IMPLANT, BILATERAL Bilateral 2000's  . DILATION AND CURETTAGE OF UTERUS     several  . FOOT SURGERY Right 1943-1944   4 surgeries  . IR KYPHO THORACIC WITH BONE BIOPSY  07/27/2017  . THYROIDECTOMY    . TONSILLECTOMY      Family History  Problem Relation Age of Onset  . Breast cancer Mother   . Heart Problems Father   . Heart attack Father     Allergies  Allergen Reactions  . Penicillins Hives and Rash    Has patient had a PCN reaction causing immediate rash, facial/tongue/throat swelling, SOB or lightheadedness with hypotension: Yes Has patient had a PCN reaction causing severe rash involving mucus membranes or skin necrosis: Yes Has patient had a PCN reaction that required hospitalization No Has patient had a PCN reaction occurring within the last 10 years: No If all of the above answers are "NO", then may proceed with Cephalosporin use.   . Prednisone Swelling    Current Outpatient Medications on File Prior to Visit  Medication Sig Dispense Refill  . acetaminophen (TYLENOL) 650 MG CR tablet Take 650 mg by mouth every 8 (eight) hours as needed for pain.     Marland Kitchen ADVAIR DISKUS 250-50 MCG/DOSE AEPB INHALE 1 PUFF INTO THE LUNGS 2 TIMES DAILY. 60 each 5  . albuterol (PROVENTIL) (2.5 MG/3ML) 0.083% nebulizer solution Take 3 mLs (2.5 mg total)  by nebulization every 6 (six) hours as needed for wheezing or shortness of breath. 150 mL 0  . alendronate (FOSAMAX) 70 MG tablet Take 1 tablet (70 mg total) by mouth once a week. Take with a full glass of water on an empty stomach. Mondays 12 tablet 3  . Cholecalciferol (VITAMIN D) 2000 units tablet Take 2,000 Units by mouth daily.    Marland Kitchen levothyroxine (SYNTHROID) 112 MCG tablet Take 1 tablet by mouth every morning on an empty stomach with water only.  No food or other medications for 30 minutes. 30 tablet 1  . mirtazapine (REMERON) 15 MG tablet TAKE 1/2 TO 1 TABLET BY MOUTH AT  BEDTIME FOR SLEEP AND APPETITE. 90 tablet 1  . vitamin B-12 (CYANOCOBALAMIN) 1000 MCG tablet Take 1,000 mcg by mouth daily.     No current facility-administered medications on file prior to visit.    BP 120/62   Pulse 67   Temp 98.3 F (36.8 C) (Temporal)   Ht 5' (1.524 m)   Wt 104 lb (47.2 kg)   SpO2 96%   BMI 20.31 kg/m    Objective:   Physical Exam Cardiovascular:     Rate and Rhythm: Normal rate and regular rhythm.  Pulmonary:     Effort: Pulmonary effort is normal.     Breath sounds: Normal breath sounds.  Musculoskeletal:     Cervical back: Neck supple.     Comments: Ambulates well with walker.  Skin:    General: Skin is warm and dry.  Neurological:     Mental Status: She is oriented to person, place, and time.  Psychiatric:        Mood and Affect: Mood normal.            Assessment & Plan:

## 2019-12-04 NOTE — Assessment & Plan Note (Signed)
Improved on Zoloft, maybe not quite at goal. Daughter and patient agree.  Increase dose to 75 mg x 1-2 weeks, then to 100 mg.  She and her daughter will update.   Continue Remeron 15 mg HS.

## 2019-12-04 NOTE — Patient Instructions (Signed)
We increased your sertraline (Zoloft) to 100 mg once daily. Increase your current dose to 75 mg (1 and 1/2 pills) once daily for 1-2 weeks before starting the 100 mg dose.  Stop by the lab prior to leaving today. I will notify you of your results once received.   It was a pleasure to see you today!

## 2019-12-04 NOTE — Assessment & Plan Note (Signed)
Taking levothyroxine 112 mcg correctly. Repeat TSH pending. May need to increase to 125 mcg, await results.

## 2019-12-05 ENCOUNTER — Ambulatory Visit (INDEPENDENT_AMBULATORY_CARE_PROVIDER_SITE_OTHER): Payer: Medicare Other | Admitting: Podiatry

## 2019-12-05 ENCOUNTER — Encounter: Payer: Self-pay | Admitting: Podiatry

## 2019-12-05 ENCOUNTER — Other Ambulatory Visit: Payer: Self-pay

## 2019-12-05 DIAGNOSIS — B351 Tinea unguium: Secondary | ICD-10-CM | POA: Diagnosis not present

## 2019-12-05 DIAGNOSIS — L608 Other nail disorders: Secondary | ICD-10-CM

## 2019-12-05 DIAGNOSIS — M79675 Pain in left toe(s): Secondary | ICD-10-CM | POA: Diagnosis not present

## 2019-12-05 DIAGNOSIS — M79674 Pain in right toe(s): Secondary | ICD-10-CM

## 2019-12-05 DIAGNOSIS — E039 Hypothyroidism, unspecified: Secondary | ICD-10-CM

## 2019-12-05 MED ORDER — LEVOTHYROXINE SODIUM 100 MCG PO TABS: ORAL_TABLET | ORAL | 1 refills | Status: DC

## 2019-12-05 NOTE — Progress Notes (Signed)
   SUBJECTIVE Patient presents to office today complaining of elongated, thickened nails that cause pain while ambulating in shoes.  She is unable to trim her own nails.  Normally she routinely goes to a pedicure salon to get her nails trimmed.  She has not done so in over 1 month.  Patient is here for further evaluation and treatment.  Past Medical History:  Diagnosis Date  . Anxiety   . Arthritis    "hands" (10/24/2012)  . B12 deficiency    "get shots twice/month" (10/24/2012)  . Bursitis   . Depression   . Diverticulitis   . Exertional shortness of breath   . Headache(784.0)    "weekly at least" (10/24/2012)  . Hypothyroidism   . Insomnia   . Ovarian cancer ([place])    mass hooked to sm. intestine/bladder  . Skin cancer of face   . Syncope    when neck is up or turned to side  . Syncope and collapse    "first time I ever I collapsed"; confirms h/o presyncopal events (10/24/2012)  . UTI (lower urinary tract infection)     OBJECTIVE General Patient is awake, alert, and oriented x 3 and in no acute distress. Derm Skin is dry and supple bilateral. Negative open lesions or macerations. Remaining integument unremarkable. Nails are tender, long, thickened and dystrophic with subungual debris, consistent with onychomycosis, 1-5 bilateral. No signs of infection noted. Vasc  DP and PT pedal pulses palpable bilaterally. Temperature gradient within normal limits.  Neuro Epicritic and protective threshold sensation grossly intact bilaterally.  Musculoskeletal Exam No symptomatic pedal deformities noted bilateral. Muscular strength within normal limits.  ASSESSMENT 1. Onychodystrophic nails 1-5 bilateral with hyperkeratosis of nails.  2. Onychomycosis of nail due to dermatophyte bilateral 3. Pain in foot bilateral  PLAN OF CARE 1. Patient evaluated today.  2. Instructed to maintain good pedal hygiene and foot care.  3. Mechanical debridement of nails 1-5 bilaterally performed using a  nail nipper. Filed with dremel without incident.  4. Return to clinic in 3 mos.    Edrick Kins, DPM [facility]  Dr. Edrick Kins, [address]                                        [place], [place] [address]                Office [phone]  Fax [phone]

## 2019-12-12 ENCOUNTER — Other Ambulatory Visit: Payer: Medicare Other

## 2019-12-25 ENCOUNTER — Ambulatory Visit (INDEPENDENT_AMBULATORY_CARE_PROVIDER_SITE_OTHER)
Admission: RE | Admit: 2019-12-25 | Discharge: 2019-12-25 | Disposition: A | Discharge status: Home / Self Care | Payer: Medicare Other | Source: Ambulatory Visit | Attending: Primary Care | Admitting: Primary Care

## 2019-12-25 ENCOUNTER — Ambulatory Visit (INDEPENDENT_AMBULATORY_CARE_PROVIDER_SITE_OTHER): Payer: Medicare Other | Admitting: Primary Care

## 2019-12-25 ENCOUNTER — Other Ambulatory Visit: Payer: Self-pay

## 2019-12-25 ENCOUNTER — Encounter: Payer: Self-pay | Admitting: Primary Care

## 2019-12-25 VITALS — BP 124/62 | HR 62 | Temp 97.6°F | Ht 60.0 in | Wt 105.0 lb

## 2019-12-25 DIAGNOSIS — I700 Atherosclerosis of aorta: Secondary | ICD-10-CM | POA: Diagnosis not present

## 2019-12-25 DIAGNOSIS — M546 Pain in thoracic spine: Secondary | ICD-10-CM | POA: Diagnosis not present

## 2019-12-25 DIAGNOSIS — M4185 Other forms of scoliosis, thoracolumbar region: Secondary | ICD-10-CM | POA: Diagnosis not present

## 2019-12-25 DIAGNOSIS — R350 Frequency of micturition: Secondary | ICD-10-CM

## 2019-12-25 DIAGNOSIS — G8929 Other chronic pain: Secondary | ICD-10-CM | POA: Diagnosis not present

## 2019-12-25 DIAGNOSIS — S22000A Wedge compression fracture of unspecified thoracic vertebra, initial encounter for closed fracture: Secondary | ICD-10-CM

## 2019-12-25 DIAGNOSIS — S22080A Wedge compression fracture of T11-T12 vertebra, initial encounter for closed fracture: Secondary | ICD-10-CM | POA: Diagnosis not present

## 2019-12-25 LAB — CBC WITH DIFFERENTIAL/PLATELET
Basophils Absolute: 0 10*3/uL (ref 0.0–0.1)
Basophils Relative: 0.6 % (ref 0.0–3.0)
Eosinophils Absolute: 0.1 10*3/uL (ref 0.0–0.7)
Eosinophils Relative: 3.2 % (ref 0.0–5.0)
HCT: 34.8 % — ABNORMAL LOW (ref 36.0–46.0)
Hemoglobin: 11.5 g/dL — ABNORMAL LOW (ref 12.0–15.0)
Lymphocytes Relative: 34.1 % (ref 12.0–46.0)
Lymphs Abs: 1.6 10*3/uL (ref 0.7–4.0)
MCHC: 33.1 g/dL (ref 30.0–36.0)
MCV: 91 fl (ref 78.0–100.0)
Monocytes Absolute: 0.6 10*3/uL (ref 0.1–1.0)
Monocytes Relative: 12.9 % — ABNORMAL HIGH (ref 3.0–12.0)
Neutro Abs: 2.3 10*3/uL (ref 1.4–7.7)
Neutrophils Relative %: 49.2 % (ref 43.0–77.0)
Platelets: 233 10*3/uL (ref 150.0–400.0)
RBC: 3.82 Mil/uL — ABNORMAL LOW (ref 3.87–5.11)
RDW: 13.7 % (ref 11.5–15.5)
WBC: 4.7 10*3/uL (ref 4.0–10.5)

## 2019-12-25 LAB — BASIC METABOLIC PANEL
BUN: 16 mg/dL (ref 6–23)
CO2: 27 mEq/L (ref 19–32)
Calcium: 9.3 mg/dL (ref 8.4–10.5)
Chloride: 100 mEq/L (ref 96–112)
Creatinine, Ser: 0.79 mg/dL (ref 0.40–1.20)
GFR: 66.33 mL/min (ref >60.00)
Glucose, Bld: 79 mg/dL (ref 70–99)
Potassium: 4 mEq/L (ref 3.5–5.1)
Sodium: 135 mEq/L (ref 135–145)

## 2019-12-25 NOTE — Assessment & Plan Note (Signed)
Acute for the last week, unable to provide urine specimen today. She will return when able.  Labs pending. She appears stable.

## 2019-12-25 NOTE — Progress Notes (Signed)
Subjective:    Patient ID: [name], female    DOB: [birth year], 86 y.o.   MRN: [id number]  HPI  This visit occurred during the SARS-CoV-2 public health emergency.  Safety protocols were in place, including screening questions prior to the visit, additional usage of staff PPE, and extensive cleaning of exam room while observing appropriate contact time as indicated for disinfecting solutions.   [name] is a 86 year old female with a history of hypothyroidism, anxiety and depression, urinary incontinence, insomnia, decreased appetite, thoracic compression fracture, who presents today with her family for a chief complaint of back pain.  She is believes she may have a "kidney infection" as she's noticed more urinary frequency and increased right sided thoracic back pain. Symptoms began about one week ago.  She denies hematuria, dysuria, stomach pain, nausea, rash, injury/falls, overdoing any activity. She doesn't drink much water. Her family denies changes in mental status or confusion.   She's taken Ibuprofen 800 mg 2-3 times daily with some improvement in back pain.   Review of Systems  Constitutional: Negative for fever.  Genitourinary: Positive for frequency. Negative for dysuria, flank pain, hematuria and urgency.  Musculoskeletal: Positive for back pain.  Skin: Negative for rash and wound.       Past Medical History:  Diagnosis Date  . Anxiety   . Arthritis    "hands" (10/24/2012)  . B12 deficiency    "get shots twice/month" (10/24/2012)  . Bursitis   . Depression   . Diverticulitis   . Exertional shortness of breath   . Headache(784.0)    "weekly at least" (10/24/2012)  . Hypothyroidism   . Insomnia   . Ovarian cancer ([place])    mass hooked to sm. intestine/bladder  . Skin cancer of face   . Syncope    when neck is up or turned to side  . Syncope and collapse    "first time I ever I collapsed"; confirms h/o presyncopal events (10/24/2012)  . UTI (lower  urinary tract infection)      Social History   Socioeconomic History  . Marital status: Widowed    Spouse name: Not on file  . Number of children: Not on file  . Years of education: Not on file  . Highest education level: Not on file  Occupational History  . Not on file  Tobacco Use  . Smoking status: Never Smoker  . Smokeless tobacco: Never Used  Vaping Use  . Vaping Use: Never used  Substance and Sexual Activity  . Alcohol use: No  . Drug use: No  . Sexual activity: Never  Other Topics Concern  . Not on file  Social History Narrative  . Not on file   Social Determinants of Health   Financial Resource Strain: Low Risk   . Difficulty of Paying Living Expenses: Not hard at all  Food Insecurity: No Food Insecurity  . Worried About Charity fundraiser in the Last Year: Never true  . Ran Out of Food in the Last Year: Never true  Transportation Needs: No Transportation Needs  . Lack of Transportation (Medical): No  . Lack of Transportation (Non-Medical): No  Physical Activity: Inactive  . Days of Exercise per Week: 0 days  . Minutes of Exercise per Session: 0 min  Stress: Stress Concern Present  . Feeling of Stress : To some extent  Social Connections: Not on file  Intimate Partner Violence: Not At Risk  . Fear of Current or Ex-Partner: No  .  Emotionally Abused: No  . Physically Abused: No  . Sexually Abused: No    Past Surgical History:  Procedure Laterality Date  . ABDOMINAL HYSTERECTOMY    . CATARACT EXTRACTION W/ INTRAOCULAR LENS  IMPLANT, BILATERAL Bilateral 2000's  . DILATION AND CURETTAGE OF UTERUS     several  . FOOT SURGERY Right 1943-1944   4 surgeries  . IR KYPHO THORACIC WITH BONE BIOPSY  07/27/2017  . THYROIDECTOMY    . TONSILLECTOMY      Family History  Problem Relation Age of Onset  . Breast cancer Mother   . Heart Problems Father   . Heart attack Father     Allergies  Allergen Reactions  . Penicillins Hives and Rash    Has patient had  a PCN reaction causing immediate rash, facial/tongue/throat swelling, SOB or lightheadedness with hypotension: Yes Has patient had a PCN reaction causing severe rash involving mucus membranes or skin necrosis: Yes Has patient had a PCN reaction that required hospitalization No Has patient had a PCN reaction occurring within the last 10 years: No If all of the above answers are "NO", then may proceed with Cephalosporin use.   . Prednisone Swelling    Current Outpatient Medications on File Prior to Visit  Medication Sig Dispense Refill  . acetaminophen (TYLENOL) 650 MG CR tablet Take 650 mg by mouth every 8 (eight) hours as needed for pain.     Marland Kitchen ADVAIR DISKUS 250-50 MCG/DOSE AEPB INHALE 1 PUFF INTO THE LUNGS 2 TIMES DAILY. 60 each 5  . albuterol (PROVENTIL) (2.5 MG/3ML) 0.083% nebulizer solution Take 3 mLs (2.5 mg total) by nebulization every 6 (six) hours as needed for wheezing or shortness of breath. 150 mL 0  . alendronate (FOSAMAX) 70 MG tablet Take 1 tablet (70 mg total) by mouth once a week. Take with a full glass of water on an empty stomach. Mondays 12 tablet 3  . Cholecalciferol (VITAMIN D) 2000 units tablet Take 2,000 Units by mouth daily.    Marland Kitchen levothyroxine (SYNTHROID) 100 MCG tablet One tab on Sunday - Wednesday 30 tablet 1  . levothyroxine (SYNTHROID) 112 MCG tablet Take 1 tablet by mouth every morning on an empty stomach with water only.  No food or other medications for 30 minutes. 30 tablet 1  . mirtazapine (REMERON) 15 MG tablet TAKE 1/2 TO 1 TABLET BY MOUTH AT BEDTIME FOR SLEEP AND APPETITE. 90 tablet 1  . sertraline (ZOLOFT) 100 MG tablet Take 1 tablet (100 mg total) by mouth daily. For anxiety and depression. 90 tablet 1  . vitamin B-12 (CYANOCOBALAMIN) 1000 MCG tablet Take 1,000 mcg by mouth daily.     No current facility-administered medications on file prior to visit.    BP 124/62   Pulse 62   Temp 97.6 F (36.4 C) (Temporal)   Ht 5' (1.524 m)   Wt 105 lb (47.6 kg)    SpO2 97%   BMI 20.51 kg/m    Objective:   Physical Exam Cardiovascular:     Rate and Rhythm: Normal rate and regular rhythm.  Pulmonary:     Effort: Pulmonary effort is normal.     Breath sounds: Normal breath sounds.  Musculoskeletal:     Thoracic back: Tenderness and bony tenderness present. No swelling or spasms. Normal range of motion.       Back:     Comments: Tender upon very light palpation to right thoracic back. No rash.  Skin:    General: Skin is warm  and dry.     Findings: No rash.  Neurological:     Mental Status: She is oriented to person, place, and time.  Psychiatric:        Mood and Affect: Mood normal.            Assessment & Plan:

## 2019-12-25 NOTE — Assessment & Plan Note (Signed)
Suspect more so related to chronic back pain from compression fracture. Will obtain updated plain films today.  UA needs to be collected, she was unable today. She will provide a sample once she's able.

## 2019-12-25 NOTE — Assessment & Plan Note (Signed)
This could be causing pain, will obtain updated xray's. No rash or evidence of shingles. No acute trauma.  Await results.

## 2019-12-26 ENCOUNTER — Other Ambulatory Visit (INDEPENDENT_AMBULATORY_CARE_PROVIDER_SITE_OTHER): Payer: Medicare Other

## 2019-12-26 ENCOUNTER — Other Ambulatory Visit: Payer: Self-pay | Admitting: Primary Care

## 2019-12-26 DIAGNOSIS — R350 Frequency of micturition: Secondary | ICD-10-CM

## 2019-12-27 ENCOUNTER — Telehealth: Payer: Self-pay | Admitting: Primary Care

## 2019-12-27 LAB — URINALYSIS, ROUTINE W REFLEX MICROSCOPIC
Bilirubin Urine: NEGATIVE
Hgb urine dipstick: NEGATIVE
Ketones, ur: NEGATIVE
Leukocytes,Ua: NEGATIVE
Nitrite: NEGATIVE
RBC / HPF: NONE SEEN (ref 0–?)
Specific Gravity, Urine: 1.02 (ref 1.000–1.030)
Total Protein, Urine: NEGATIVE
Urine Glucose: NEGATIVE
Urobilinogen, UA: 0.2 (ref 0.0–1.0)
WBC, UA: NONE SEEN (ref 0–?)
pH: 6 (ref 5.0–8.0)

## 2019-12-27 LAB — URINE CULTURE
MICRO NUMBER:: 11321386
SPECIMEN QUALITY:: ADEQUATE

## 2019-12-27 NOTE — Telephone Encounter (Signed)
Error

## 2020-02-04 ENCOUNTER — Other Ambulatory Visit: Payer: Self-pay

## 2020-02-04 ENCOUNTER — Other Ambulatory Visit (INDEPENDENT_AMBULATORY_CARE_PROVIDER_SITE_OTHER): Payer: Medicare Other

## 2020-02-04 DIAGNOSIS — E039 Hypothyroidism, unspecified: Secondary | ICD-10-CM

## 2020-02-04 LAB — TSH: TSH: 0.8 u[IU]/mL (ref 0.35–4.50)

## 2020-02-10 ENCOUNTER — Other Ambulatory Visit: Payer: Self-pay | Admitting: Primary Care

## 2020-02-11 ENCOUNTER — Other Ambulatory Visit: Payer: Self-pay | Admitting: Primary Care

## 2020-02-11 DIAGNOSIS — E039 Hypothyroidism, unspecified: Secondary | ICD-10-CM

## 2020-02-11 MED ORDER — SYNTHROID 112 MCG PO TABS: ORAL_TABLET | ORAL | 3 refills | Status: DC

## 2020-02-11 MED ORDER — SYNTHROID 100 MCG PO TABS: ORAL_TABLET | ORAL | 3 refills | Status: DC

## 2020-03-18 ENCOUNTER — Telehealth: Payer: Self-pay | Admitting: Primary Care

## 2020-03-18 NOTE — Telephone Encounter (Signed)
LVM for pt to rtn my call to re-schedule AWV with [facility] on 04/28/20

## 2020-03-24 ENCOUNTER — Other Ambulatory Visit: Payer: Self-pay | Admitting: Primary Care

## 2020-03-24 DIAGNOSIS — M8000XS Age-related osteoporosis with current pathological fracture, unspecified site, sequela: Secondary | ICD-10-CM

## 2020-03-26 ENCOUNTER — Other Ambulatory Visit: Payer: Self-pay | Admitting: Primary Care

## 2020-03-26 DIAGNOSIS — E039 Hypothyroidism, unspecified: Secondary | ICD-10-CM

## 2020-04-03 ENCOUNTER — Other Ambulatory Visit: Payer: Self-pay | Admitting: Primary Care

## 2020-04-03 DIAGNOSIS — F32A Depression, unspecified: Secondary | ICD-10-CM

## 2020-04-03 DIAGNOSIS — F419 Anxiety disorder, unspecified: Secondary | ICD-10-CM

## 2020-04-22 ENCOUNTER — Other Ambulatory Visit: Payer: Self-pay | Admitting: Primary Care

## 2020-04-22 DIAGNOSIS — R630 Anorexia: Secondary | ICD-10-CM

## 2020-04-22 DIAGNOSIS — F32A Depression, unspecified: Secondary | ICD-10-CM

## 2020-04-22 DIAGNOSIS — F419 Anxiety disorder, unspecified: Secondary | ICD-10-CM

## 2020-04-22 DIAGNOSIS — G4700 Insomnia, unspecified: Secondary | ICD-10-CM

## 2020-04-28 ENCOUNTER — Ambulatory Visit: Payer: Medicare Other

## 2020-05-02 ENCOUNTER — Other Ambulatory Visit: Payer: Self-pay | Admitting: Primary Care

## 2020-05-02 DIAGNOSIS — J439 Emphysema, unspecified: Secondary | ICD-10-CM

## 2020-05-30 ENCOUNTER — Other Ambulatory Visit: Payer: Self-pay

## 2020-05-30 ENCOUNTER — Ambulatory Visit (INDEPENDENT_AMBULATORY_CARE_PROVIDER_SITE_OTHER): Payer: Medicare Other

## 2020-05-30 DIAGNOSIS — Z0000 Encounter for general adult medical examination without abnormal findings: Secondary | ICD-10-CM

## 2020-05-30 NOTE — Progress Notes (Signed)
PCP notes:  Health Maintenance: Prevnar 13- due Covid booster- due   Abnormal Screenings: none   Patient concerns: none   Nurse concerns: one   Next PCP appt.: nonen

## 2020-05-30 NOTE — Progress Notes (Signed)
Subjective:   [name] is a 87 y.o. female who presents for Medicare Annual (Subsequent) preventive examination.  Review of Systems: N/A     I connected with the patient today by telephone and verified that I am speaking with the correct person using two identifiers. Location patient: home Location nurse: work Persons participating in the telephone visit: patient, nurse.   I discussed the limitations, risks, security and privacy concerns of performing an evaluation and management service by telephone and the availability of in person appointments. I also discussed with the patient that there may be a patient responsible charge related to this service. The patient expressed understanding and verbally consented to this telephonic visit.        Cardiac Risk Factors include: advanced age (>44men, >21 women)     Objective:    Today's Vitals   05/30/20 1446  PainSc: 4    There is no height or weight on file to calculate BMI.  Advanced Directives 05/30/2020 04/26/2019 09/06/2018 03/21/2018 07/27/2017 06/20/2016 10/24/2012  Does Patient Have a Medical Advance Directive? Yes Yes No Yes No No Patient has advance directive, copy not in chart  Type of Advance Directive [place];Living will [place];Living will - [place];Living will - - -  Copy of [place] in Chart? No - copy requested No - copy requested - No - copy requested - - Copy requested from family  Would patient like information on creating a medical advance directive? - - No - Patient declined - No - Patient declined - -  Pre-existing out of facility DNR order (yellow form or pink MOST form) - - - - - - -    Current Medications (verified) Outpatient Encounter Medications as of 05/30/2020  Medication Sig  . acetaminophen (TYLENOL) 650 MG CR tablet Take 650 mg by mouth every 8 (eight) hours as needed for pain.   Marland Kitchen ADVAIR DISKUS 250-50 MCG/DOSE AEPB  INHALE 1 PUFF INTO THE LUNGS 2 TIMES DAILY.  Marland Kitchen albuterol (PROVENTIL) (2.5 MG/3ML) 0.083% nebulizer solution Take 3 mLs (2.5 mg total) by nebulization every 6 (six) hours as needed for wheezing or shortness of breath.  Marland Kitchen alendronate (FOSAMAX) 70 MG tablet TAKE 1 TABLET BY MOUTH ONCE A WEEK. TAKE WITH A FULL GLASS OF WATER ON AN EMPTY STOMACH. MONDAYS  . Cholecalciferol (VITAMIN D) 2000 units tablet Take 2,000 Units by mouth daily.  . mirtazapine (REMERON) 15 MG tablet TAKE 1/2 TO 1 TABLET BY MOUTH AT BEDTIME FOR SLEEP AND APPETITE.  Marland Kitchen sertraline (ZOLOFT) 100 MG tablet Take 1 tablet (100 mg total) by mouth daily. For anxiety and depression.  Marland Kitchen SYNTHROID 100 MCG tablet Take 1 tablet by mouth on empty stomach with water only on Sunday through Thursday, no food or other medicine for 30 minutes.  Marland Kitchen SYNTHROID 112 MCG tablet Take 1 tablet by mouth every morning on empty stomach with water only on Friday and Saturday, no food or other medicines for 30 minutes.  . vitamin B-12 (CYANOCOBALAMIN) 1000 MCG tablet Take 1,000 mcg by mouth daily.   No facility-administered encounter medications on file as of 05/30/2020.    Allergies (verified) Penicillins and Prednisone   History: Past Medical History:  Diagnosis Date  . Anxiety   . Arthritis    "hands" (10/24/2012)  . B12 deficiency    "get shots twice/month" (10/24/2012)  . Bursitis   . Depression   . Diverticulitis   . Exertional shortness  of breath   . Headache(784.0)    "weekly at least" (10/24/2012)  . Hypothyroidism   . Insomnia   . Ovarian cancer ([place])    mass hooked to sm. intestine/bladder  . Skin cancer of face   . Syncope    when neck is up or turned to side  . Syncope and collapse    "first time I ever I collapsed"; confirms h/o presyncopal events (10/24/2012)  . UTI (lower urinary tract infection)    Past Surgical History:  Procedure Laterality Date  . ABDOMINAL HYSTERECTOMY    . CATARACT EXTRACTION W/ INTRAOCULAR LENS   IMPLANT, BILATERAL Bilateral 2000's  . DILATION AND CURETTAGE OF UTERUS     several  . FOOT SURGERY Right 1943-1944   4 surgeries  . IR KYPHO THORACIC WITH BONE BIOPSY  07/27/2017  . THYROIDECTOMY    . TONSILLECTOMY     Family History  Problem Relation Age of Onset  . Breast cancer Mother   . Heart Problems Father   . Heart attack Father    Social History   Socioeconomic History  . Marital status: Widowed    Spouse name: Not on file  . Number of children: Not on file  . Years of education: Not on file  . Highest education level: Not on file  Occupational History  . Not on file  Tobacco Use  . Smoking status: Never Smoker  . Smokeless tobacco: Never Used  Vaping Use  . Vaping Use: Never used  Substance and Sexual Activity  . Alcohol use: No  . Drug use: No  . Sexual activity: Never  Other Topics Concern  . Not on file  Social History Narrative  . Not on file   Social Determinants of Health   Financial Resource Strain: Low Risk   . Difficulty of Paying Living Expenses: Not hard at all  Food Insecurity: No Food Insecurity  . Worried About Charity fundraiser in the Last Year: Never true  . Ran Out of Food in the Last Year: Never true  Transportation Needs: No Transportation Needs  . Lack of Transportation (Medical): No  . Lack of Transportation (Non-Medical): No  Physical Activity: Inactive  . Days of Exercise per Week: 0 days  . Minutes of Exercise per Session: 0 min  Stress: Stress Concern Present  . Feeling of Stress : To some extent  Social Connections: Not on file    Tobacco Counseling Counseling given: Not Answered   Clinical Intake:  Pre-visit preparation completed: Yes  Pain : 0-10 Pain Score: 4  Pain Type: Chronic pain Pain Location: Back Pain Orientation: Lower Pain Descriptors / Indicators: Sore Pain Onset: More than a month ago Pain Frequency: Intermittent     Nutritional Risks: None Diabetes: No  How often do you need to have  someone help you when you read instructions, pamphlets, or other written materials from your doctor or pharmacy?: 1 - Never  Diabetic: No Nutrition Risk Assessment:  Has the patient had any N/V/D within the last 2 months?  No  Does the patient have any non-healing wounds?  No  Has the patient had any unintentional weight loss or weight gain?  No   Diabetes:  Is the patient diabetic?  No  If diabetic, was a CBG obtained today?  N/A Did the patient bring in their glucometer from home?  N/A How often do you monitor your CBG's? N/A.   Financial Strains and Diabetes Management:  Are you having any financial strains  with the device, your supplies or your medication? N/A.  Does the patient want to be seen by Chronic Care Management for management of their diabetes?  N/A Would the patient like to be referred to a Nutritionist or for Diabetic Management?  N/A Interpreter Needed?: No  Information entered by :: CJohnson, LPN   Activities of Daily Living In your present state of health, do you have any difficulty performing the following activities: 05/30/2020  Hearing? N  Vision? N  Difficulty concentrating or making decisions? N  Walking or climbing stairs? N  Dressing or bathing? N  Doing errands, shopping? N  Preparing Food and eating ? N  Using the Toilet? N  In the past six months, have you accidently leaked urine? Y  Comment wears depends  Do you have problems with loss of bowel control? N  Managing your Medications? N  Managing your Finances? N  Housekeeping or managing your Housekeeping? N  Some recent data might be hidden    Patient Care Team: Pleas Koch, NP as PCP - General (Internal Medicine) End, Harrell Gave, MD as PCP - Cardiology (Cardiology)  Indicate any recent Medical Services you may have received from other than [facility] providers in the past year (date may be approximate).     Assessment:   This is a routine wellness examination for  Ayako.  Hearing/Vision screen  Hearing Screening   125Hz  250Hz  500Hz  1000Hz  2000Hz  3000Hz  4000Hz  6000Hz  8000Hz   Right ear:           Left ear:           Vision Screening Comments: Patient gets annual eye exams   Dietary issues and exercise activities discussed: Current Exercise Habits: The patient does not participate in regular exercise at present, Exercise limited by: None identified  Goals Addressed            This Visit's Progress   . Patient Stated       05/30/2020, I will maintain and continue medications as prescribed.      Depression Screen PHQ 2/9 Scores 05/30/2020 04/26/2019 03/21/2018  PHQ - 2 Score 0 0 3  PHQ- 9 Score 0 0 10    Fall Risk Fall Risk  05/30/2020 04/26/2019 03/21/2018  Falls in the past year? 1 1 1   Number falls in past yr: 1 1 1   Injury with Fall? 0 0 1  Risk for fall due to : Medication side effect Medication side effect;History of fall(s) Impaired balance/gait;Impaired mobility;History of fall(s)  Follow up Falls evaluation completed;Falls prevention discussed Falls evaluation completed;Falls prevention discussed -    FALL RISK PREVENTION PERTAINING TO THE HOME:  Any stairs in or around the home? Yes  If so, are there any without handrails? No  Home free of loose throw rugs in walkways, pet beds, electrical cords, etc? Yes  Adequate lighting in your home to reduce risk of falls? Yes   ASSISTIVE DEVICES UTILIZED TO PREVENT FALLS:  Life alert? No  Use of a cane, walker or w/c? Yes  Grab bars in the bathroom? Yes  Shower chair or bench in shower? Yes  Elevated toilet seat or a handicapped toilet? Yes   TIMED UP AND GO:  Was the test performed? N/A telephone visit.    Cognitive Function: MMSE - Mini Mental State Exam 05/30/2020 04/26/2019 03/21/2018  Orientation to time 5 5 3   Orientation to time comments - - disoriented to year despite cues given  Orientation to Place 5 5 5   Registration 3  3 3  Attention/ Calculation 5 5 0  Recall 3 3 3    Language- name 2 objects - - 0  Language- repeat 1 1 1   Language- follow 3 step command - - 2  Language- follow 3 step command-comments - - unable to follow 1 step of 3 step command despite cues given  Language- read & follow direction - - 0  Write a sentence - - 0  Copy design - - 0  Total score - - 17  Mini Cog  Mini-Cog screen was completed. Maximum score is 22. A value of 0 denotes this part of the MMSE was not completed or the patient failed this part of the Mini-Cog screening.       Immunizations Immunization History  Administered Date(s) Administered  . Influenza,inj,Quad PF,6+ Mos 09/14/2017  . Influenza-Unspecified 10/23/2012  . PFIZER(Purple Top)SARS-COV-2 Vaccination 05/12/2019, 06/02/2019  . Zoster 06/12/2015    TDAP status: Due, Education has been provided regarding the importance of this vaccine. Advised may receive this vaccine at local pharmacy or Health Dept. Aware to provide a copy of the vaccination record if obtained from local pharmacy or Health Dept. Verbalized acceptance and understanding.  Flu Vaccine status: due Fall 2022  Pneumococcal vaccine status: Due, Education has been provided regarding the importance of this vaccine. Advised may receive this vaccine at local pharmacy or Health Dept. Aware to provide a copy of the vaccination record if obtained from local pharmacy or Health Dept. Verbalized acceptance and understanding.  Covid-19 vaccine status: 2 doses completed. Booster due. Patient is aware.   Qualifies for Shingles Vaccine? Yes   Zostavax completed Yes   Shingrix Completed?: No.    Education has been provided regarding the importance of this vaccine. Patient has been advised to call insurance company to determine out of pocket expense if they have not yet received this vaccine. Advised may also receive vaccine at local pharmacy or Health Dept. Verbalized acceptance and understanding.  Screening Tests Health Maintenance  Topic Date Due  .  PNA vac Low Risk Adult (1 of 2 - PCV13) Never done  . COVID-19 Vaccine (3 - Booster for Pfizer series) 11/02/2019  . TETANUS/TDAP  05/31/2022 (Originally 08/04/1947)  . INFLUENZA VACCINE  08/11/2020  . DEXA SCAN  Completed  . HPV VACCINES  Aged Out    Health Maintenance  Health Maintenance Due  Topic Date Due  . PNA vac Low Risk Adult (1 of 2 - PCV13) Never done  . COVID-19 Vaccine (3 - Booster for Pfizer series) 11/02/2019    Colorectal cancer screening: No longer required.   Mammogram status: No longer required due to age.  Bone Density status: Completed 04/24/2019. Results reflect: Bone density results: OSTEOPOROSIS. Repeat every 2 years.  Lung Cancer Screening: (Low Dose CT Chest recommended if Age 52-80 years, 30 pack-year currently smoking OR have quit w/in 15years.) does not qualify.   Additional Screening:  Hepatitis C Screening: does not qualify; Completed N/A  Vision Screening: Recommended annual ophthalmology exams for early detection of glaucoma and other disorders of the eye. Is the patient up to date with their annual eye exam?  Yes  Who is the provider or what is the name of the office in which the patient attends annual eye exams? [facility] If pt is not established with a provider, would they like to be referred to a provider to establish care? No .   Dental Screening: Recommended annual dental exams for proper oral hygiene  Community Resource Referral / Chronic  Care Management: CRR required this visit?  No   CCM required this visit?  No      Plan:     I have personally reviewed and noted the following in the patient's chart:   . Medical and social history . Use of alcohol, tobacco or illicit drugs  . Current medications and supplements including opioid prescriptions.  . Functional ability and status . Nutritional status . Physical activity . Advanced directives . List of other physicians . Hospitalizations, surgeries, and ER visits in previous  12 months . Vitals . Screenings to include cognitive, depression, and falls . Referrals and appointments  In addition, I have reviewed and discussed with patient certain preventive protocols, quality metrics, and best practice recommendations. A written personalized care plan for preventive services as well as general preventive health recommendations were provided to patient.   Due to this being a telephonic visit, the after visit summary with patients personalized plan was offered to patient via office or my-chart.  Patient preferred to pick up at office at next visit or via mychart.   Andrez Grime, LPN   [id number]

## 2020-05-30 NOTE — Patient Instructions (Signed)
[name] , Thank you for taking time to come for your Medicare Wellness Visit. I appreciate your ongoing commitment to your health goals. Please review the following plan we discussed and let me know if I can assist you in the future.   Screening recommendations/referrals: Colonoscopy: no longer required Mammogram: no longer required Bone Density: Up to date, completed 04/24/2019, due 04/2021 Recommended yearly ophthalmology/optometry visit for glaucoma screening and checkup Recommended yearly dental visit for hygiene and checkup  Vaccinations: Influenza vaccine: due Fall 2022 Pneumococcal vaccine: due, will discuss with provider at next visit  Tdap vaccine: decline-insurance  Shingles vaccine: due, check with your insurance regarding coverage if interested    Covid-19:2 doses completed. Booster id due. Please get at earliest convenience.   Advanced directives: Please bring a copy of your POA (Power of Attorney) and/or Living Will to your next appointment.   Conditions/risks identified: none  Next appointment: Follow up in one year for your annual wellness visit    Preventive Care 65 Years and Older, Female Preventive care refers to lifestyle choices and visits with your health care provider that can promote health and wellness. What does preventive care include?  A yearly physical exam. This is also called an annual well check.  Dental exams once or twice a year.  Routine eye exams. Ask your health care provider how often you should have your eyes checked.  Personal lifestyle choices, including:  Daily care of your teeth and gums.  Regular physical activity.  Eating a healthy diet.  Avoiding tobacco and drug use.  Limiting alcohol use.  Practicing safe sex.  Taking low-dose aspirin every day.  Taking vitamin and mineral supplements as recommended by your health care provider. What happens during an annual well check? The services and screenings done by your  health care provider during your annual well check will depend on your age, overall health, lifestyle risk factors, and family history of disease. Counseling  Your health care provider may ask you questions about your:  Alcohol use.  Tobacco use.  Drug use.  Emotional well-being.  Home and relationship well-being.  Sexual activity.  Eating habits.  History of falls.  Memory and ability to understand (cognition).  Work and work Statistician.  Reproductive health. Screening  You may have the following tests or measurements:  Height, weight, and BMI.  Blood pressure.  Lipid and cholesterol levels. These may be checked every 5 years, or more frequently if you are over 51 years old.  Skin check.  Lung cancer screening. You may have this screening every year starting at age 40 if you have a 30-pack-year history of smoking and currently smoke or have quit within the past 15 years.  Fecal occult blood test (FOBT) of the stool. You may have this test every year starting at age 47.  Flexible sigmoidoscopy or colonoscopy. You may have a sigmoidoscopy every 5 years or a colonoscopy every 10 years starting at age 16.  Hepatitis C blood test.  Hepatitis B blood test.  Sexually transmitted disease (STD) testing.  Diabetes screening. This is done by checking your blood sugar (glucose) after you have not eaten for a while (fasting). You may have this done every 1-3 years.  Bone density scan. This is done to screen for osteoporosis. You may have this done starting at age 97.  Mammogram. This may be done every 1-2 years. Talk to your health care provider about how often you should have regular mammograms. Talk with your health care provider  about your test results, treatment options, and if necessary, the need for more tests. Vaccines  Your health care provider may recommend certain vaccines, such as:  Influenza vaccine. This is recommended every year.  Tetanus, diphtheria, and  acellular pertussis (Tdap, Td) vaccine. You may need a Td booster every 10 years.  Zoster vaccine. You may need this after age 33.  Pneumococcal 13-valent conjugate (PCV13) vaccine. One dose is recommended after age 45.  Pneumococcal polysaccharide (PPSV23) vaccine. One dose is recommended after age 27. Talk to your health care provider about which screenings and vaccines you need and how often you need them. This information is not intended to replace advice given to you by your health care provider. Make sure you discuss any questions you have with your health care provider. Document Released: 01/24/2015 Document Revised: 09/17/2015 Document Reviewed: 10/29/2014 Elsevier Interactive Patient Education  2017 [place] Prevention in the Home Falls can cause injuries. They can happen to people of all ages. There are many things you can do to make your home safe and to help prevent falls. What can I do on the outside of my home?  Regularly fix the edges of walkways and driveways and fix any cracks.  Remove anything that might make you trip as you walk through a door, such as a raised step or threshold.  Trim any bushes or trees on the path to your home.  Use bright outdoor lighting.  Clear any walking paths of anything that might make someone trip, such as rocks or tools.  Regularly check to see if handrails are loose or broken. Make sure that both sides of any steps have handrails.  Any raised decks and porches should have guardrails on the edges.  Have any leaves, snow, or ice cleared regularly.  Use sand or salt on walking paths during winter.  Clean up any spills in your garage right away. This includes oil or grease spills. What can I do in the bathroom?  Use night lights.  Install grab bars by the toilet and in the tub and shower. Do not use towel bars as grab bars.  Use non-skid mats or decals in the tub or shower.  If you need to sit down in the shower, use  a plastic, non-slip stool.  Keep the floor dry. Clean up any water that spills on the floor as soon as it happens.  Remove soap buildup in the tub or shower regularly.  Attach bath mats securely with double-sided non-slip rug tape.  Do not have throw rugs and other things on the floor that can make you trip. What can I do in the bedroom?  Use night lights.  Make sure that you have a light by your bed that is easy to reach.  Do not use any sheets or blankets that are too big for your bed. They should not hang down onto the floor.  Have a firm chair that has side arms. You can use this for support while you get dressed.  Do not have throw rugs and other things on the floor that can make you trip. What can I do in the kitchen?  Clean up any spills right away.  Avoid walking on wet floors.  Keep items that you use a lot in easy-to-reach places.  If you need to reach something above you, use a strong step stool that has a grab bar.  Keep electrical cords out of the way.  Do not use floor polish or  wax that makes floors slippery. If you must use wax, use non-skid floor wax.  Do not have throw rugs and other things on the floor that can make you trip. What can I do with my stairs?  Do not leave any items on the stairs.  Make sure that there are handrails on both sides of the stairs and use them. Fix handrails that are broken or loose. Make sure that handrails are as long as the stairways.  Check any carpeting to make sure that it is firmly attached to the stairs. Fix any carpet that is loose or worn.  Avoid having throw rugs at the top or bottom of the stairs. If you do have throw rugs, attach them to the floor with carpet tape.  Make sure that you have a light switch at the top of the stairs and the bottom of the stairs. If you do not have them, ask someone to add them for you. What else can I do to help prevent falls?  Wear shoes that:  Do not have high heels.  Have  rubber bottoms.  Are comfortable and fit you well.  Are closed at the toe. Do not wear sandals.  If you use a stepladder:  Make sure that it is fully opened. Do not climb a closed stepladder.  Make sure that both sides of the stepladder are locked into place.  Ask someone to hold it for you, if possible.  Clearly mark and make sure that you can see:  Any grab bars or handrails.  First and last steps.  Where the edge of each step is.  Use tools that help you move around (mobility aids) if they are needed. These include:  Canes.  Walkers.  Scooters.  Crutches.  Turn on the lights when you go into a dark area. Replace any light bulbs as soon as they burn out.  Set up your furniture so you have a clear path. Avoid moving your furniture around.  If any of your floors are uneven, fix them.  If there are any pets around you, be aware of where they are.  Review your medicines with your doctor. Some medicines can make you feel dizzy. This can increase your chance of falling. Ask your doctor what other things that you can do to help prevent falls. This information is not intended to replace advice given to you by your health care provider. Make sure you discuss any questions you have with your health care provider. Document Released: 10/24/2008 Document Revised: 06/05/2015 Document Reviewed: 02/01/2014 Elsevier Interactive Patient Education  2017 [employer].

## 2020-08-03 ENCOUNTER — Emergency Department: Payer: Medicare Other

## 2020-08-03 ENCOUNTER — Other Ambulatory Visit: Payer: Self-pay

## 2020-08-03 ENCOUNTER — Emergency Department
Admission: EM | Admit: 2020-08-03 | Discharge: 2020-08-03 | Disposition: A | Discharge status: Home / Self Care | Payer: Medicare Other | Attending: Emergency Medicine | Admitting: Emergency Medicine

## 2020-08-03 DIAGNOSIS — W19XXXA Unspecified fall, initial encounter: Secondary | ICD-10-CM | POA: Insufficient documentation

## 2020-08-03 DIAGNOSIS — Z8543 Personal history of malignant neoplasm of ovary: Secondary | ICD-10-CM | POA: Diagnosis not present

## 2020-08-03 DIAGNOSIS — Z85828 Personal history of other malignant neoplasm of skin: Secondary | ICD-10-CM | POA: Diagnosis not present

## 2020-08-03 DIAGNOSIS — M5459 Other low back pain: Secondary | ICD-10-CM | POA: Diagnosis not present

## 2020-08-03 DIAGNOSIS — Z9889 Other specified postprocedural states: Secondary | ICD-10-CM | POA: Diagnosis not present

## 2020-08-03 DIAGNOSIS — J449 Chronic obstructive pulmonary disease, unspecified: Secondary | ICD-10-CM | POA: Diagnosis not present

## 2020-08-03 DIAGNOSIS — E039 Hypothyroidism, unspecified: Secondary | ICD-10-CM | POA: Insufficient documentation

## 2020-08-03 DIAGNOSIS — M545 Low back pain, unspecified: Secondary | ICD-10-CM | POA: Diagnosis not present

## 2020-08-03 DIAGNOSIS — R102 Pelvic and perineal pain: Secondary | ICD-10-CM | POA: Diagnosis not present

## 2020-08-03 DIAGNOSIS — Z79899 Other long term (current) drug therapy: Secondary | ICD-10-CM | POA: Diagnosis not present

## 2020-08-03 DIAGNOSIS — M160 Bilateral primary osteoarthritis of hip: Secondary | ICD-10-CM | POA: Diagnosis not present

## 2020-08-03 DIAGNOSIS — M47816 Spondylosis without myelopathy or radiculopathy, lumbar region: Secondary | ICD-10-CM | POA: Diagnosis not present

## 2020-08-03 MED ORDER — HYDROCODONE-ACETAMINOPHEN 5-325 MG PO TABS: 0.5000 | ORAL_TABLET | Freq: Four times a day (QID) | ORAL | 0 refills | Status: DC | PRN

## 2020-08-03 MED ORDER — HYDROCODONE-ACETAMINOPHEN 5-325 MG PO TABS
1.0000 | ORAL_TABLET | ORAL | Status: AC
Start: 2020-08-03 — End: 2020-08-03
  Administered 2020-08-03: 1 via ORAL
  Filled 2020-08-03: qty 1

## 2020-08-03 NOTE — ED Triage Notes (Signed)
Pt states she fell on Friday, landing on her bottom "really hard"- pt states since then her lower back has progressively gotten more painful- pt denies any dizziness or other precipitating factors before her fall

## 2020-08-03 NOTE — Discharge Instructions (Addendum)
Please rest, avoid excessive activity.  You may apply ice or use a heating pad to lower back.  Take Norco as needed for pain.  Call orthopedic office tomorrow to schedule follow-up appointment and return to the ER for any worsening symptoms or urgent changes in your health.

## 2020-08-03 NOTE — ED Notes (Signed)
See triage note   States she fell on Friday  States she landed on her buttocks/back

## 2020-08-03 NOTE — ED Provider Notes (Signed)
[facility] EMERGENCY DEPARTMENT Provider Note   CSN: [id number] Arrival date & time: 08/03/20  1604     History Chief Complaint  Patient presents with   Fall   Back Pain    [name] is a 87 y.o. female presents to the emergency department for evaluation of a fall that occurred 2 days ago.  Patient states she was getting something out of the refrigerator, turned, lost her balance and fell onto her buttocks.  She denies hitting her head or losing consciousness.  She was able to get up and ambulate but has been having midline lower back pain since.  Pain has been moderate.  No numbness tingling radicular symptoms.  She denies any groin or thigh pain or buttocks pain.  She has been ambulatory with a walker which is her baseline.  She has moderate to severe pain with ambulation in her midline of her lower back.  She also has some discomfort with sitting.  She denies any dizziness, lightheadedness, chest pain or shortness of breath.  HPI     Past Medical History:  Diagnosis Date   Anxiety    Arthritis    "hands" (10/24/2012)   B12 deficiency    "get shots twice/month" (10/24/2012)   Bursitis    Depression    Diverticulitis    Exertional shortness of breath    Headache(784.0)    "weekly at least" (10/24/2012)   Hypothyroidism    Insomnia    Ovarian cancer ([place])    mass hooked to sm. intestine/bladder   Skin cancer of face    Syncope    when neck is up or turned to side   Syncope and collapse    "first time I ever I collapsed"; confirms h/o presyncopal events (10/24/2012)   UTI (lower urinary tract infection)     Patient Active Problem List   Diagnosis Date Noted   Acute right-sided thoracic back pain 12/25/2019   Osteoporosis with current pathological fracture 03/27/2019   Decreased appetite 03/27/2019   Insomnia 09/11/2018   Urinary frequency 08/30/2018   Cough 05/01/2018   Urinary incontinence 03/21/2018   Irritation of external ear canal  09/28/2017   Compression fracture of body of thoracic vertebra (HCC) 09/14/2017   Anxiety and depression 09/14/2017   Syncope and collapse 10/24/2012   Orthostatic hypotension 10/24/2012   Weakness generalized 01/21/2011   Dizziness 01/21/2011   COPD (chronic obstructive pulmonary disease) ([place]) 01/21/2011   Hypothyroidism 03/28/2008   DYSPNEA 03/28/2008    Past Surgical History:  Procedure Laterality Date   ABDOMINAL HYSTERECTOMY     CATARACT EXTRACTION W/ INTRAOCULAR LENS  IMPLANT, BILATERAL Bilateral 2000's   DILATION AND CURETTAGE OF UTERUS     several   FOOT SURGERY Right 1943-1944   4 surgeries   IR KYPHO THORACIC WITH BONE BIOPSY  07/27/2017   THYROIDECTOMY     TONSILLECTOMY       OB History   No obstetric history on file.     Family History  Problem Relation Age of Onset   Breast cancer Mother    Heart Problems Father    Heart attack Father     Social History   Tobacco Use   Smoking status: Never   Smokeless tobacco: Never  Vaping Use   Vaping Use: Never used  Substance Use Topics   Alcohol use: No   Drug use: No    Home Medications Prior to Admission medications   Medication Sig Start Date End Date Taking?  Authorizing Provider  HYDROcodone-acetaminophen (NORCO) 5-325 MG tablet Take 0.5-1 tablets by mouth every 6 (six) hours as needed for moderate pain. 08/03/20  Yes Duanne Guess, PA-C  acetaminophen (TYLENOL) 650 MG CR tablet Take 650 mg by mouth every 8 (eight) hours as needed for pain.     [provider]  ADVAIR DISKUS 250-50 MCG/DOSE AEPB INHALE 1 PUFF INTO THE LUNGS 2 TIMES DAILY. 05/02/20   Pleas Koch, NP  albuterol (PROVENTIL) (2.5 MG/3ML) 0.083% nebulizer solution Take 3 mLs (2.5 mg total) by nebulization every 6 (six) hours as needed for wheezing or shortness of breath. 11/08/17   Pleas Koch, NP  alendronate (FOSAMAX) 70 MG tablet TAKE 1 TABLET BY MOUTH ONCE A WEEK. TAKE WITH A FULL GLASS OF WATER ON AN EMPTY STOMACH.  MONDAYS 03/24/20   Pleas Koch, NP  Cholecalciferol (VITAMIN D) 2000 units tablet Take 2,000 Units by mouth daily.    [provider]  mirtazapine (REMERON) 15 MG tablet TAKE 1/2 TO 1 TABLET BY MOUTH AT BEDTIME FOR SLEEP AND APPETITE. 04/22/20   Pleas Koch, NP  sertraline (ZOLOFT) 100 MG tablet Take 1 tablet (100 mg total) by mouth daily. For anxiety and depression. 12/04/19   Pleas Koch, NP  SYNTHROID 100 MCG tablet Take 1 tablet by mouth on empty stomach with water only on Sunday through Thursday, no food or other medicine for 30 minutes. 02/11/20   Pleas Koch, NP  SYNTHROID 112 MCG tablet Take 1 tablet by mouth every morning on empty stomach with water only on Friday and Saturday, no food or other medicines for 30 minutes. 02/11/20   Pleas Koch, NP  vitamin B-12 (CYANOCOBALAMIN) 1000 MCG tablet Take 1,000 mcg by mouth daily.    [provider]    Allergies    Penicillins and Prednisone  Review of Systems   Review of Systems  Constitutional:  Negative for activity change.  Eyes:  Negative for pain and visual disturbance.  Respiratory:  Negative for shortness of breath.   Cardiovascular:  Negative for chest pain and leg swelling.  Gastrointestinal:  Negative for abdominal pain.  Genitourinary:  Negative for flank pain and pelvic pain.  Musculoskeletal:  Positive for back pain. Negative for arthralgias, gait problem, joint swelling, myalgias, neck pain and neck stiffness.  Skin:  Negative for wound.  Neurological:  Negative for dizziness, syncope, weakness, light-headedness, numbness and headaches.  Psychiatric/Behavioral:  Negative for confusion and decreased concentration.    Physical Exam Updated Vital Signs BP (!) 150/86 (BP Location: Left Arm)   Pulse 91   Temp 99 F (37.2 C) (Oral)   Resp 18   Ht '5\' 3"'$  (1.6 m)   Wt 43.1 kg   SpO2 96%   BMI 16.83 kg/m   Physical Exam Constitutional:      Appearance: She is  well-developed.  HENT:     Head: Normocephalic and atraumatic.     Right Ear: External ear normal.     Left Ear: External ear normal.     Nose: Nose normal.  Eyes:     Conjunctiva/sclera: Conjunctivae normal.     Pupils: Pupils are equal, round, and reactive to light.  Cardiovascular:     Rate and Rhythm: Normal rate.  Pulmonary:     Effort: Pulmonary effort is normal. No respiratory distress.     Breath sounds: Normal breath sounds.  Abdominal:     Palpations: Abdomen is soft.     Tenderness:  There is no abdominal tenderness.  Musculoskeletal:        General: No deformity. Normal range of motion.     Cervical back: Normal range of motion.     Comments: Patient tender along the midline of the lower lumbar spine with mild superior mid line sacral tenderness with no SI joint tenderness.  She is able to tolerate bilateral hip internal X rotation with no significant discomfort and no limitations of range of motion.  No swelling in the lower extremities.  No shortening or rotational deformity.  Skin:    General: Skin is warm and dry.     Findings: No rash.  Neurological:     Mental Status: She is alert and oriented to person, place, and time.     Cranial Nerves: No cranial nerve deficit.     Coordination: Coordination normal.  Psychiatric:        Mood and Affect: Mood normal.        Behavior: Behavior normal.        Thought Content: Thought content normal.    ED Results / Procedures / Treatments   Labs (all labs ordered are listed, but only abnormal results are displayed) Labs Reviewed - No data to display  EKG None  Radiology DG Lumbar Spine 2-3 Views  Result Date: 08/03/2020 CLINICAL DATA:  Low back pain and pelvic pain after a fall 2 days ago. EXAM: LUMBAR SPINE - 2-3 VIEW COMPARISON:  07/11/2017 FINDINGS: Five lumbar type vertebrae. Lumbar scoliosis convex towards the right. No anterior subluxations. Compression of T11 post kyphoplasty. No acute compression deformities  identified. Degenerative change throughout the lumbar spine with narrowed interspaces and endplate osteophyte formation. Degenerative changes in the facet joints. Visualized sacrum appears intact. Surgical clips in the pelvis and left lower quadrant. IMPRESSION: Lumbar scoliosis and degenerative changes. No acute displaced fractures identified. Electronically Signed   By: Lucienne Capers M.D.   On: 08/03/2020 18:18   DG Pelvis 1-2 Views  Result Date: 08/03/2020 CLINICAL DATA:  Low back pain and pelvic pain after a fall 2 days ago. Limited range of motion. EXAM: PELVIS - 1-2 VIEW COMPARISON:  09/18/2005 FINDINGS: Postoperative changes in the pelvis and left lower quadrant. Sacrum, pelvis, and hips appear intact. No evidence of acute fracture or dislocation. No focal bone lesion or bone destruction. SI joints and symphysis pubis are not displaced. Mild degenerative changes in the lower lumbar spine and hips. IMPRESSION: No acute bony abnormalities. Electronically Signed   By: Lucienne Capers M.D.   On: 08/03/2020 18:17    Procedures Procedures   Medications Ordered in ED Medications  HYDROcodone-acetaminophen (NORCO/VICODIN) 5-325 MG per tablet 1 tablet (1 tablet Oral Given 08/03/20 1745)    ED Course  I have reviewed the triage vital signs and the nursing notes.  Pertinent labs & imaging results that were available during my care of the patient were reviewed by me and considered in my medical decision making (see chart for details).    MDM Rules/Calculators/A&P                           87 year old female with mechanical fall 2 days ago.  She is experiencing midline low back pain with no radicular symptoms.  No reports of chest pain, shortness of breath.  No dizziness, lightheadedness, headache nausea or vomiting.  Pain is moderate to severe along the lower lumbar spine.  X-rays negative for acute bony abnormality.  Patient is ambulatory  with a walker.  Patient given prescription for Norco to  take sparingly as needed for severe pain.  She will call orthopedic office to schedule follow-up appointment.  She understands signs symptoms return to the ER for. Final Clinical Impression(s) / ED Diagnoses Final diagnoses:  Fall, initial encounter  Acute midline low back pain without sciatica    Rx / DC Orders ED Discharge Orders          Ordered    HYDROcodone-acetaminophen (NORCO) 5-325 MG tablet  Every 6 hours PRN        08/03/20 1917             Renata Caprice 08/03/20 Epifanio Lesches, MD 08/04/20 1511

## 2020-08-08 ENCOUNTER — Other Ambulatory Visit: Payer: Self-pay

## 2020-08-08 ENCOUNTER — Ambulatory Visit
Admission: RE | Admit: 2020-08-08 | Discharge: 2020-08-08 | Disposition: A | Discharge status: Home / Self Care | Payer: Medicare Other | Source: Ambulatory Visit | Attending: Primary Care | Admitting: Primary Care

## 2020-08-08 ENCOUNTER — Telehealth: Payer: Self-pay

## 2020-08-08 ENCOUNTER — Ambulatory Visit (INDEPENDENT_AMBULATORY_CARE_PROVIDER_SITE_OTHER): Payer: Medicare Other | Admitting: Primary Care

## 2020-08-08 ENCOUNTER — Encounter: Payer: Self-pay | Admitting: Primary Care

## 2020-08-08 VITALS — BP 110/60 | HR 90 | Temp 98.2°F | Ht 63.0 in | Wt 108.0 lb

## 2020-08-08 DIAGNOSIS — M545 Low back pain, unspecified: Secondary | ICD-10-CM | POA: Insufficient documentation

## 2020-08-08 DIAGNOSIS — R102 Pelvic and perineal pain: Secondary | ICD-10-CM | POA: Insufficient documentation

## 2020-08-08 DIAGNOSIS — M25531 Pain in right wrist: Secondary | ICD-10-CM

## 2020-08-08 DIAGNOSIS — K6389 Other specified diseases of intestine: Secondary | ICD-10-CM | POA: Diagnosis not present

## 2020-08-08 DIAGNOSIS — Z9889 Other specified postprocedural states: Secondary | ICD-10-CM | POA: Diagnosis not present

## 2020-08-08 DIAGNOSIS — K5730 Diverticulosis of large intestine without perforation or abscess without bleeding: Secondary | ICD-10-CM | POA: Diagnosis not present

## 2020-08-08 DIAGNOSIS — G8929 Other chronic pain: Secondary | ICD-10-CM | POA: Insufficient documentation

## 2020-08-08 DIAGNOSIS — M47816 Spondylosis without myelopathy or radiculopathy, lumbar region: Secondary | ICD-10-CM | POA: Diagnosis not present

## 2020-08-08 MED ORDER — KETOROLAC TROMETHAMINE 60 MG/2ML IM SOLN
60.0000 mg | Freq: Once | INTRAMUSCULAR | Status: AC
  Administered 2020-08-08: 60 mg via INTRAMUSCULAR

## 2020-08-08 MED ORDER — HYDROCODONE-ACETAMINOPHEN 5-325 MG PO TABS: 1.0000 | ORAL_TABLET | ORAL | 0 refills | Status: DC | PRN

## 2020-08-08 NOTE — Assessment & Plan Note (Addendum)
Since fall one week ago. Negative plain films in ED five days ago.  Given her level of pain and apparent discomfort, will proceed with CT of her pelvis.   STAT orders placed.   Refill provided for Norco 5-325 mg, will add prednisone 20 mg.

## 2020-08-08 NOTE — Assessment & Plan Note (Signed)
Acute on chronic since fall one week ago. Checking xrays of right wrist.

## 2020-08-08 NOTE — Telephone Encounter (Signed)
Spoke with patient's daughter, Sunday Spillers (on [place]), regarding results including CT scan, lumbar and wrist plain films.  Her daughter is not certain regarding the reaction she has had to prednisone, so we will refrain from prescribing at this time.  She will try the Norco, we also discussed to work on some ambulation when possible.  Consider orthopedic evaluation, daughter will update.

## 2020-08-08 NOTE — Patient Instructions (Signed)
Go get your CT scan and xrays now.  You may take the hydrocodone-acetaminophen every 4 hours as needed for pain.   It was a pleasure to see you today!

## 2020-08-08 NOTE — Progress Notes (Signed)
Subjective:    Patient ID: [name], female    DOB: [birth year], 87 y.o.   MRN: [id number]  HPI  [name] is a very pleasant 87 y.o. female with a history of orthostatic hypotension, COPD, hypothyroidism, generalized weakness, dizziness, syncope, insomnia, who presents today for ED follow up.  She presented to [facility] ED on 08/03/20 for midline lower back pain since a fall two days prior. She was getting something out of the refrigerator, turned, lost her balance, and fell onto her buttocks.   During her stay in the ED she underwent xrays of the lumbar spine and pelvis which were negative for fracture. She was ambulating with a walker. She was discharged home later that evening with Norco and instructions to see orthopedics.   Since her ED visit she continues with moderate to severe left lower back and left posterior and lateral hip pain which is constant and occurs with sitting, standing, walking, and lying down. She denies radiation of pain to her legs and upper back, right sided pain, midline back pain. She's not moving much at home due to the pain, is walking to the bathroom and dressing herself for the most part.   She's taken Norco without much improvement, is now out of Norco.    Review of Systems  Musculoskeletal:  Positive for arthralgias and back pain.       Left pelvic pain  Neurological:  Positive for weakness. Negative for numbness.        Past Medical History:  Diagnosis Date   Anxiety    Arthritis    "hands" (10/24/2012)   B12 deficiency    "get shots twice/month" (10/24/2012)   Bursitis    Depression    Diverticulitis    Exertional shortness of breath    Headache(784.0)    "weekly at least" (10/24/2012)   Hypothyroidism    Insomnia    Ovarian cancer ([place])    mass hooked to sm. intestine/bladder   Skin cancer of face    Syncope    when neck is up or turned to side   Syncope and collapse    "first time I ever I collapsed"; confirms h/o presyncopal  events (10/24/2012)   UTI (lower urinary tract infection)     Social History   Socioeconomic History   Marital status: Widowed    Spouse name: Not on file   Number of children: Not on file   Years of education: Not on file   Highest education level: Not on file  Occupational History   Not on file  Tobacco Use   Smoking status: Never   Smokeless tobacco: Never  Vaping Use   Vaping Use: Never used  Substance and Sexual Activity   Alcohol use: No   Drug use: No   Sexual activity: Never  Other Topics Concern   Not on file  Social History Narrative   Not on file   Social Determinants of Health   Financial Resource Strain: Low Risk    Difficulty of Paying Living Expenses: Not hard at all  Food Insecurity: No Food Insecurity   Worried About Charity fundraiser in the Last Year: Never true   [place] in the Last Year: Never true  Transportation Needs: No Transportation Needs   Lack of Transportation (Medical): No   Lack of Transportation (Non-Medical): No  Physical Activity: Inactive   Days of Exercise per Week: 0 days   Minutes of Exercise per Session: 0 min  Stress: Stress Concern Present   Feeling of Stress : To some extent  Social Connections: Not on file  Intimate Partner Violence: Not At Risk   Fear of Current or Ex-Partner: No   Emotionally Abused: No   Physically Abused: No   Sexually Abused: No    Past Surgical History:  Procedure Laterality Date   ABDOMINAL HYSTERECTOMY     CATARACT EXTRACTION W/ INTRAOCULAR LENS  IMPLANT, BILATERAL Bilateral 2000's   DILATION AND CURETTAGE OF UTERUS     several   FOOT SURGERY Right [phone]   4 surgeries   IR KYPHO THORACIC WITH BONE BIOPSY  07/27/2017   THYROIDECTOMY     TONSILLECTOMY      Family History  Problem Relation Age of Onset   Breast cancer Mother    Heart Problems Father    Heart attack Father     Allergies  Allergen Reactions   Penicillins Hives and Rash    Has patient had a PCN reaction  causing immediate rash, facial/tongue/throat swelling, SOB or lightheadedness with hypotension: Yes Has patient had a PCN reaction causing severe rash involving mucus membranes or skin necrosis: Yes Has patient had a PCN reaction that required hospitalization No Has patient had a PCN reaction occurring within the last 10 years: No If all of the above answers are "NO", then may proceed with Cephalosporin use.    Prednisone Swelling    Current Outpatient Medications on File Prior to Visit  Medication Sig Dispense Refill   acetaminophen (TYLENOL) 650 MG CR tablet Take 650 mg by mouth every 8 (eight) hours as needed for pain.      ADVAIR DISKUS 250-50 MCG/DOSE AEPB INHALE 1 PUFF INTO THE LUNGS 2 TIMES DAILY. 60 each 2   albuterol (PROVENTIL) (2.5 MG/3ML) 0.083% nebulizer solution Take 3 mLs (2.5 mg total) by nebulization every 6 (six) hours as needed for wheezing or shortness of breath. 150 mL 0   alendronate (FOSAMAX) 70 MG tablet TAKE 1 TABLET BY MOUTH ONCE A WEEK. TAKE WITH A FULL GLASS OF WATER ON AN EMPTY STOMACH. MONDAYS 12 tablet 3   Cholecalciferol (VITAMIN D) 2000 units tablet Take 2,000 Units by mouth daily.     mirtazapine (REMERON) 15 MG tablet TAKE 1/2 TO 1 TABLET BY MOUTH AT BEDTIME FOR SLEEP AND APPETITE. 90 tablet 1   sertraline (ZOLOFT) 100 MG tablet Take 1 tablet (100 mg total) by mouth daily. For anxiety and depression. 90 tablet 1   SYNTHROID 100 MCG tablet Take 1 tablet by mouth on empty stomach with water only on Sunday through Thursday, no food or other medicine for 30 minutes. 60 tablet 3   SYNTHROID 112 MCG tablet Take 1 tablet by mouth every morning on empty stomach with water only on Friday and Saturday, no food or other medicines for 30 minutes. 24 tablet 3   vitamin B-12 (CYANOCOBALAMIN) 1000 MCG tablet Take 1,000 mcg by mouth daily.     No current facility-administered medications on file prior to visit.    BP 110/60 (BP Location: Right Arm, Patient Position:  Sitting, Cuff Size: Normal)   Pulse 90   Temp 98.2 F (36.8 C) (Temporal)   Ht '5\' 3"'$  (1.6 m)   Wt 108 lb (49 kg)   SpO2 96%   BMI 19.13 kg/m  Objective:   Physical Exam Constitutional:      Comments: Appears uncomfortable  Pulmonary:     Effort: Pulmonary effort is normal.  Musculoskeletal:  Back:     Comments: Tenderness to left lower back, left posterior pelvis, left lateral pelvis with decrease in ROM and pain to left pelvis. Ambulating with walker, painful.   Significant tenderness to left lower back/left posterior pelvis, left lateral pelvis with light palpation.   Ambulates with walker, is stable, but appears uncomfortable.   Skin:    General: Skin is warm and dry.          Assessment & Plan:      This visit occurred during the SARS-CoV-2 public health emergency.  Safety protocols were in place, including screening questions prior to the visit, additional usage of staff PPE, and extensive cleaning of exam room while observing appropriate contact time as indicated for disinfecting solutions.

## 2020-08-08 NOTE — Assessment & Plan Note (Signed)
Since falling one week ago. Moderate to severe pain since fall. Negative plain films in the ED five days ago.  Repeat lumbar plain films today.  Refill provided for Norco 5-325 mg, will add prednisone 20 mg.

## 2020-08-08 NOTE — Telephone Encounter (Signed)
[place] from [facility] Radiology called report for CT pelvis. No acute finding. Impressions copied below and brought to PCP in office.   IMPRESSION: 1. No acute pelvic abnormality. 2. Postoperative changes in the abdomen. 3. Colonic diverticulosis.

## 2020-08-18 ENCOUNTER — Other Ambulatory Visit: Payer: Self-pay | Admitting: Primary Care

## 2020-08-18 DIAGNOSIS — R102 Pelvic and perineal pain: Secondary | ICD-10-CM

## 2020-08-18 DIAGNOSIS — M545 Low back pain, unspecified: Secondary | ICD-10-CM

## 2020-08-20 NOTE — Telephone Encounter (Signed)
Referral placed for orthopedics.  I wasn't sure if she preferred [facility] or [facility] so I but both as an option.

## 2020-08-20 NOTE — Telephone Encounter (Signed)
Sunday Spillers left v/m that pt still has a lot of back pain and Sunday Spillers wants to know if can do MRI or what to do about pts back pain. Sunday Spillers request cb.

## 2020-08-20 NOTE — Addendum Note (Signed)
Addended by: Pleas Koch on: 08/20/2020 04:46 PM   Modules accepted: Orders

## 2020-08-28 ENCOUNTER — Other Ambulatory Visit: Payer: Self-pay | Admitting: Primary Care

## 2020-08-28 DIAGNOSIS — F419 Anxiety disorder, unspecified: Secondary | ICD-10-CM

## 2020-08-28 NOTE — Telephone Encounter (Signed)
Seen in office recently called in refill for 90 and 1 rf

## 2020-09-01 ENCOUNTER — Ambulatory Visit (INDEPENDENT_AMBULATORY_CARE_PROVIDER_SITE_OTHER): Payer: Medicare Other | Admitting: Family Medicine

## 2020-09-01 ENCOUNTER — Encounter: Payer: Self-pay | Admitting: Family Medicine

## 2020-09-01 DIAGNOSIS — M545 Low back pain, unspecified: Secondary | ICD-10-CM

## 2020-09-01 MED ORDER — HYDROCODONE-ACETAMINOPHEN 5-325 MG PO TABS: 1.0000 | ORAL_TABLET | Freq: Every evening | ORAL | 0 refills | Status: DC | PRN

## 2020-09-01 NOTE — Progress Notes (Signed)
Office Visit Note   Patient: [name]           Date of Birth: Nov 25, 1928           MRN: [id number] Visit Date: 09/01/2020 Requested by: Pleas Koch, NP [address],  [place] [id number] PCP: Pleas Koch, NP  Subjective: Chief Complaint  Patient presents with   Lower Back - Pain    Xrays done 08/03/20 & 08/08/20. Had a fall 4-5 weeks ago, had xray done shows arthritis, she was not hurting this bad till she fell, hasn't been able to sleep in her bed due to pain and not able to get herself up, can't get herself dressed due to the pain, and states that since she has fallen she doesn't have control of her bladder any more. They would like to get an MRI done to see what is going on in the back.     HPI: She is here with low back pain.  About 5 weeks ago she fell in her home backward, hitting her head against the refrigerator and landing on her behind.  She has had pain in her back since then.  She had x-rays obtained which were read as negative for acute fracture.  Pain keeps her from sleeping.  She has had some urinary incontinence as well.  No radicular symptoms.  She was given hydrocodone which helped, but now she is taking Tylenol.  She has a history of compression fractures in the past treated with vertebroplasty.               ROS: No fevers or chills.  All other systems were reviewed and are negative.  Objective: Vital Signs: There were no vitals taken for this visit.  Physical Exam:  General:  Alert and oriented, in no acute distress. Pulm:  Breathing unlabored. Psy:  Normal mood, congruent affect. Skin: No rash or bruising Low back: She is tender near the L2 spinous process.  She is tender to the left of midline in the paraspinous muscles.  No pain with internal hip rotation.  Straight leg raise negative, lower extremity strength and reflexes are normal.    Imaging: No new x-rays obtained.  X-rays from July reviewed on computer and compared to images  from a CT scan a couple years ago are concerning for possible L2 compression fracture.    Assessment & Plan: Acute low back pain status post fall, concerning for new L2 compression fracture -MRI to further evaluate.  Vertebroplasty consult if indicated.  Hydrocodone to use sparingly for pain.     Procedures: No procedures performed        PMFS History: Patient Active Problem List   Diagnosis Date Noted   Acute pelvic pain 08/08/2020   Acute left-sided low back pain without sciatica 08/08/2020   Acute pain of right wrist 08/08/2020   Acute right-sided thoracic back pain 12/25/2019   Osteoporosis with current pathological fracture 03/27/2019   Decreased appetite 03/27/2019   Insomnia 09/11/2018   Urinary frequency 08/30/2018   Cough 05/01/2018   Urinary incontinence 03/21/2018   Irritation of external ear canal 09/28/2017   Compression fracture of body of thoracic vertebra (HCC) 09/14/2017   Anxiety and depression 09/14/2017   Syncope and collapse 10/24/2012   Orthostatic hypotension 10/24/2012   Weakness generalized 01/21/2011   Dizziness 01/21/2011   COPD (chronic obstructive pulmonary disease) ([place]) 01/21/2011   Hypothyroidism 03/28/2008   DYSPNEA 03/28/2008   Past Medical History:  Diagnosis Date   Anxiety    Arthritis    "hands" (10/24/2012)   B12 deficiency    "get shots twice/month" (10/24/2012)   Bursitis    Depression    Diverticulitis    Exertional shortness of breath    Headache(784.0)    "weekly at least" (10/24/2012)   Hypothyroidism    Insomnia    Ovarian cancer ([place])    mass hooked to sm. intestine/bladder   Skin cancer of face    Syncope    when neck is up or turned to side   Syncope and collapse    "first time I ever I collapsed"; confirms h/o presyncopal events (10/24/2012)   UTI (lower urinary tract infection)     Family History  Problem Relation Age of Onset   Breast cancer Mother    Heart Problems Father    Heart attack Father      Past Surgical History:  Procedure Laterality Date   ABDOMINAL HYSTERECTOMY     CATARACT EXTRACTION W/ INTRAOCULAR LENS  IMPLANT, BILATERAL Bilateral 2000's   DILATION AND CURETTAGE OF UTERUS     several   FOOT SURGERY Right 1943-1944   4 surgeries   IR KYPHO THORACIC WITH BONE BIOPSY  07/27/2017   THYROIDECTOMY     TONSILLECTOMY     Social History   Occupational History   Not on file  Tobacco Use   Smoking status: Never   Smokeless tobacco: Never  Vaping Use   Vaping Use: Never used  Substance and Sexual Activity   Alcohol use: No   Drug use: No   Sexual activity: Never

## 2020-09-11 ENCOUNTER — Telehealth: Payer: Self-pay

## 2020-09-11 NOTE — Telephone Encounter (Signed)
[facility] - Client TELEPHONE ADVICE RECORD [facility] Patient Name: [name] Gender: Female DOB: 1928-07-04 Age: 87 Y 1 M 7 D Return Phone Number: [phone] (Primary) Address: City/ State/ Zip: [place] [address] Client [facility] - Client Client Site [place] - Day Physician Alma Friendly - NP Contact Type Call Who Is Calling Patient / Member / Family / Caregiver Call Type Triage / Clinical Caller Name Bevelyn Buckles Relationship To Patient Daughter Return Phone Number [phone] (Primary) Chief Complaint Urinary Incontinence Reason for Call Symptomatic / Request for Health Information Initial Comment Her mother has a bladder infection, no control over her bladder. Did have a fall the other day as well. Has appt for tomorrow Translation No Nurse Assessment Nurse: D'Heur Lucia Gaskins, RN, Adrienne Date/Time (Eastern Time): 09/11/2020 9:45:14 AM Confirm and document reason for call. If symptomatic, describe symptoms. ---Her mother has a bladder infection, no control over her bladder. She did have a fall a few weeks ago as well and loss of bladder control worsened after the fall. No fever. She is having left flank pain. Does the patient have any new or worsening symptoms? ---Yes Will a triage be completed? ---Yes Related visit to physician within the last 2 weeks? ---No Does the PT have any chronic conditions? (i.e. diabetes, asthma, this includes High risk factors for pregnancy, etc.) ---Yes List chronic conditions. ---anxiety, hypothyroid Is this a behavioral health or substance abuse call? ---No Guidelines Guideline Title Affirmed Question Affirmed Notes Nurse Date/Time Eilene Ghazi Time) Urinary Symptoms Side (flank) or lower back pain present D'Heur Lucia Gaskins, RN, Adrienne 09/11/2020 9:48:11 AM Disp. Time Eilene Ghazi Time) Disposition Final User 09/11/2020 9:51:40 AM See PCP  within 24 Hours Yes D'Heur Lucia Gaskins, RN, Vincente Liberty PLEASE NOTE: All timestamps contained within this report are represented as [place] Standard Time. CONFIDENTIALTY NOTICE: This fax transmission is intended only for the addressee. It contains information that is legally privileged, confidential or otherwise protected from use or disclosure. If you are not the intended recipient, you are strictly prohibited from reviewing, disclosing, copying using or disseminating any of this information or taking any action in reliance on or regarding this information. If you have received this fax in error, please notify us immediately by telephone so that we can arrange for its return to [place]. Phone: [phone], Toll-Free: [phone], Fax: [phone] Page: 2 of 2 Call Id: [id number] [place] Disagree/Comply Comply Caller Understands Yes PreDisposition Call Doctor Care Advice Given Per Guideline SEE PCP WITHIN 24 HOURS: CALL BACK IF: * Fever occurs * Unable to urinate and bladder feels full * You become worse CARE ADVICE given per Urinary Symptoms (Adult) guideline. Comments User: Vincente Liberty, D'Heur Lucia Gaskins, RN Date/Time Eilene Ghazi Time): 09/11/2020 9:49:01 AM Since the fall, she has trouble getting up and walking. She cannot lie down flat. Referrals REFERRED TO PCP OFFICE

## 2020-09-11 NOTE — Telephone Encounter (Signed)
Spoke to Daughter on [place]. Will make sure mom is able to give urine at appointment

## 2020-09-11 NOTE — Telephone Encounter (Signed)
Pt already has appt on 09/12/20 at 12:20 with Allie Bossier NP. Sending note to Gentry Fitz NP and [facility] CMA.

## 2020-09-11 NOTE — Telephone Encounter (Signed)
Can we make sure she is able to provide [place] with a urine specimen during her visit?

## 2020-09-12 ENCOUNTER — Ambulatory Visit (INDEPENDENT_AMBULATORY_CARE_PROVIDER_SITE_OTHER): Payer: Medicare Other | Admitting: Primary Care

## 2020-09-12 ENCOUNTER — Other Ambulatory Visit: Payer: Self-pay

## 2020-09-12 VITALS — BP 108/64 | HR 73 | Temp 98.7°F | Ht 63.0 in | Wt 107.0 lb

## 2020-09-12 DIAGNOSIS — M545 Low back pain, unspecified: Secondary | ICD-10-CM | POA: Diagnosis not present

## 2020-09-12 DIAGNOSIS — R350 Frequency of micturition: Secondary | ICD-10-CM

## 2020-09-12 DIAGNOSIS — R32 Unspecified urinary incontinence: Secondary | ICD-10-CM | POA: Diagnosis not present

## 2020-09-12 LAB — POC URINALSYSI DIPSTICK (AUTOMATED)
Bilirubin, UA: NEGATIVE
Blood, UA: NEGATIVE
Glucose, UA: NEGATIVE
Ketones, UA: NEGATIVE
Leukocytes, UA: NEGATIVE
Nitrite, UA: NEGATIVE
Protein, UA: POSITIVE — AB
Spec Grav, UA: 1.01
Urobilinogen, UA: 0.2 U/dL
pH, UA: 6

## 2020-09-12 NOTE — Assessment & Plan Note (Addendum)
Acute since falling in late July 2022.   Checking UA today, but she was unable to provide a specimen. Will send her home with urine collection kit.   Agree with MRI Lumbar spine. She will call the orthopedic office to follow up on this.   If UA is negative and MRI doesn't show a cause for symptoms, consider overactive bladder treatment vs muscle relaxant for bladder spasms.

## 2020-09-12 NOTE — Progress Notes (Deleted)
oc

## 2020-09-12 NOTE — Assessment & Plan Note (Addendum)
Following with orthopedics, MRI lumbar spine ordered. Concern is for lumbar compression fracture.  Office visit notes reviewed from office visit 2 weeks ago.   From our end the MRI order appears as though it hasn't been approved.  Discussed this with patient and her family today.  She will call the orthopedics office today for an update. Phone number also provided for [facility].   Fortunately she has no lower extremity radiculopathy. Orthopedic MD is aware of her bladder incontinence. She is not incontinent of stool.

## 2020-09-12 NOTE — Addendum Note (Signed)
Addended by: Ellamae Sia on: 09/12/2020 04:14 PM   Modules accepted: Orders

## 2020-09-12 NOTE — Progress Notes (Signed)
Subjective:    Patient ID: [name], female    DOB: 08-02-1928, 87 y.o.   MRN: [id number]  HPI  [name] is a very pleasant 87 y.o. female with a history of COPD, hypotension, hypothyroidism, acute on chronic low back pain, osteoporosis, acute cystitis, compression fracture with vertebroplasty who presents today to discuss urinary frequency and incontinence.   She will wake during the night around 2 am to use the bathroom, loses control of her bladder, wakes up again during the morning to urinate and will lose control of her urine.   Symptoms began in late July after her fall. She is following with orthopedics and is pending a MRI of the lumbar spine, they haven't heard anything regarding scheduling. She did mention her loss of bladder control during her appointment with the orthopedist.    She denies hematuria, foul smelling urine, lower extremity pain, numbness/tingling of her lower extremities. She continues to struggle with lower back pain, cannot sleep in her bed due to the pain. She is taking the hydrocodone-acetaminophen daily with temporary improvement in pain.    Review of Systems  Constitutional:  Negative for fever.  Genitourinary:  Positive for frequency. Negative for hematuria.       Urinary incontinence   Musculoskeletal:  Positive for back pain.  Neurological:  Negative for numbness.        Past Medical History:  Diagnosis Date   Anxiety    Arthritis    "hands" (10/24/2012)   B12 deficiency    "get shots twice/month" (10/24/2012)   Bursitis    Depression    Diverticulitis    Exertional shortness of breath    Headache(784.0)    "weekly at least" (10/24/2012)   Hypothyroidism    Insomnia    Ovarian cancer ([place])    mass hooked to sm. intestine/bladder   Skin cancer of face    Syncope    when neck is up or turned to side   Syncope and collapse    "first time I ever I collapsed"; confirms h/o presyncopal events (10/24/2012)   UTI (lower urinary  tract infection)     Social History   Socioeconomic History   Marital status: Widowed    Spouse name: Not on file   Number of children: Not on file   Years of education: Not on file   Highest education level: Not on file  Occupational History   Not on file  Tobacco Use   Smoking status: Never   Smokeless tobacco: Never  Vaping Use   Vaping Use: Never used  Substance and Sexual Activity   Alcohol use: No   Drug use: No   Sexual activity: Never  Other Topics Concern   Not on file  Social History Narrative   Not on file   Social Determinants of Health   Financial Resource Strain: Low Risk    Difficulty of Paying Living Expenses: Not hard at all  Food Insecurity: No Food Insecurity   Worried About Charity fundraiser in the Last Year: Never true   [place] in the Last Year: Never true  Transportation Needs: No Transportation Needs   Lack of Transportation (Medical): No   Lack of Transportation (Non-Medical): No  Physical Activity: Inactive   Days of Exercise per Week: 0 days   Minutes of Exercise per Session: 0 min  Stress: Stress Concern Present   Feeling of Stress : To some extent  Social Connections: Not on file  Intimate Partner Violence: Not At Risk   Fear of Current or Ex-Partner: No   Emotionally Abused: No   Physically Abused: No   Sexually Abused: No    Past Surgical History:  Procedure Laterality Date   ABDOMINAL HYSTERECTOMY     CATARACT EXTRACTION W/ INTRAOCULAR LENS  IMPLANT, BILATERAL Bilateral 2000's   DILATION AND CURETTAGE OF UTERUS     several   FOOT SURGERY Right [phone]   4 surgeries   IR KYPHO THORACIC WITH BONE BIOPSY  07/27/2017   THYROIDECTOMY     TONSILLECTOMY      Family History  Problem Relation Age of Onset   Breast cancer Mother    Heart Problems Father    Heart attack Father     Allergies  Allergen Reactions   Penicillins Hives and Rash    Has patient had a PCN reaction causing immediate rash,  facial/tongue/throat swelling, SOB or lightheadedness with hypotension: Yes Has patient had a PCN reaction causing severe rash involving mucus membranes or skin necrosis: Yes Has patient had a PCN reaction that required hospitalization No Has patient had a PCN reaction occurring within the last 10 years: No If all of the above answers are "NO", then may proceed with Cephalosporin use.    Prednisone Swelling    Current Outpatient Medications on File Prior to Visit  Medication Sig Dispense Refill   acetaminophen (TYLENOL) 650 MG CR tablet Take 650 mg by mouth every 8 (eight) hours as needed for pain.      ADVAIR DISKUS 250-50 MCG/DOSE AEPB INHALE 1 PUFF INTO THE LUNGS 2 TIMES DAILY. 60 each 2   albuterol (PROVENTIL) (2.5 MG/3ML) 0.083% nebulizer solution Take 3 mLs (2.5 mg total) by nebulization every 6 (six) hours as needed for wheezing or shortness of breath. 150 mL 0   alendronate (FOSAMAX) 70 MG tablet TAKE 1 TABLET BY MOUTH ONCE A WEEK. TAKE WITH A FULL GLASS OF WATER ON AN EMPTY STOMACH. MONDAYS 12 tablet 3   Cholecalciferol (VITAMIN D) 2000 units tablet Take 2,000 Units by mouth daily.     HYDROcodone-acetaminophen (NORCO/VICODIN) 5-325 MG tablet Take 1-2 tablets by mouth at bedtime as needed for moderate pain. 20 tablet 0   mirtazapine (REMERON) 15 MG tablet TAKE 1/2 TO 1 TABLET BY MOUTH AT BEDTIME FOR SLEEP AND APPETITE. 90 tablet 1   sertraline (ZOLOFT) 100 MG tablet TAKE 1 TABLET BY MOUTH DAILY FOR ANXIETY AND DEPRESSION. 90 tablet 1   SYNTHROID 100 MCG tablet Take 1 tablet by mouth on empty stomach with water only on Sunday through Thursday, no food or other medicine for 30 minutes. 60 tablet 3   SYNTHROID 112 MCG tablet Take 1 tablet by mouth every morning on empty stomach with water only on Friday and Saturday, no food or other medicines for 30 minutes. 24 tablet 3   vitamin B-12 (CYANOCOBALAMIN) 1000 MCG tablet Take 1,000 mcg by mouth daily.     No current facility-administered  medications on file prior to visit.    BP 108/64   Pulse 73   Temp 98.7 F (37.1 C) (Temporal)   Ht '5\' 3"'$  (1.6 m)   Wt 107 lb (48.5 kg)   SpO2 97%   BMI 18.95 kg/m  Objective:   Physical Exam Constitutional:      Appearance: She is not ill-appearing.  Cardiovascular:     Rate and Rhythm: Normal rate and regular rhythm.  Pulmonary:     Effort: Pulmonary effort is normal.  Neurological:  Mental Status: She is alert and oriented to person, place, and time.          Assessment & Plan:      This visit occurred during the SARS-CoV-2 public health emergency.  Safety protocols were in place, including screening questions prior to the visit, additional usage of staff PPE, and extensive cleaning of exam room while observing appropriate contact time as indicated for disinfecting solutions.

## 2020-09-12 NOTE — Addendum Note (Signed)
Addended by: Francella Solian on: 09/12/2020 04:18 PM   Modules accepted: Orders

## 2020-09-13 LAB — URINE CULTURE
MICRO NUMBER:: 12327767
Result:: NO GROWTH
SPECIMEN QUALITY:: ADEQUATE

## 2020-09-20 ENCOUNTER — Ambulatory Visit
Admission: RE | Admit: 2020-09-20 | Discharge: 2020-09-20 | Disposition: A | Discharge status: Home / Self Care | Payer: Medicare Other | Source: Ambulatory Visit | Attending: Family Medicine | Admitting: Family Medicine

## 2020-09-20 DIAGNOSIS — M545 Low back pain, unspecified: Secondary | ICD-10-CM | POA: Diagnosis not present

## 2020-09-20 DIAGNOSIS — M5136 Other intervertebral disc degeneration, lumbar region: Secondary | ICD-10-CM | POA: Diagnosis not present

## 2020-09-22 ENCOUNTER — Telehealth: Payer: Self-pay | Admitting: Orthopaedic Surgery

## 2020-09-22 NOTE — Telephone Encounter (Signed)
Pt daughter called and states pt is in pain and wants to know if you can refill pain medication.

## 2020-09-23 ENCOUNTER — Other Ambulatory Visit: Payer: Self-pay | Admitting: Physician Assistant

## 2020-09-23 MED ORDER — HYDROCODONE-ACETAMINOPHEN 5-325 MG PO TABS: 1.0000 | ORAL_TABLET | Freq: Every evening | ORAL | 0 refills | Status: DC | PRN

## 2020-09-23 NOTE — Telephone Encounter (Signed)
Looks like she is scheduled to see Erlinda Hong. So she will be our patient

## 2020-09-23 NOTE — Telephone Encounter (Signed)
I sent in, but aren't these supposed to go to provider on call?

## 2020-09-26 ENCOUNTER — Ambulatory Visit: Payer: Medicare Other | Admitting: Orthopaedic Surgery

## 2020-09-30 ENCOUNTER — Other Ambulatory Visit: Payer: Self-pay

## 2020-09-30 ENCOUNTER — Encounter: Payer: Self-pay | Admitting: Orthopaedic Surgery

## 2020-09-30 ENCOUNTER — Ambulatory Visit (INDEPENDENT_AMBULATORY_CARE_PROVIDER_SITE_OTHER): Payer: Medicare Other | Admitting: Orthopaedic Surgery

## 2020-09-30 DIAGNOSIS — M545 Low back pain, unspecified: Secondary | ICD-10-CM | POA: Diagnosis not present

## 2020-09-30 DIAGNOSIS — S32000A Wedge compression fracture of unspecified lumbar vertebra, initial encounter for closed fracture: Secondary | ICD-10-CM | POA: Diagnosis not present

## 2020-09-30 MED ORDER — HYDROCODONE-ACETAMINOPHEN 7.5-325 MG PO TABS: 1.0000 | ORAL_TABLET | Freq: Two times a day (BID) | ORAL | 0 refills | Status: DC | PRN

## 2020-09-30 NOTE — Progress Notes (Signed)
Office Visit Note   Patient: [name]           Date of Birth: [birth year]           MRN: [id number] Visit Date: 09/30/2020              Requested by: Pleas Koch, NP [place],  [place] [address] PCP: Pleas Koch, NP   Assessment & Plan: Visit Diagnoses:  1. Acute midline low back pain without sciatica   2. Closed compression fracture of body of lumbar vertebra ([place])     Plan: Impression is chronic back pain with near MRI findings showing inferior endplate fracture at L1 with 25% height loss.  At this point, we have discussed placing the patient in a corset as well as referring her to interventional radiology for kyphoplasty.  She is agreeable to this plan.  She is also asked for stronger pain medication to have agreed to call in a stronger dose of Norco.  She will follow-up with [place] as needed.  Follow-Up Instructions: Return if symptoms worsen or fail to improve.   Orders:  Orders Placed This Encounter  Procedures   Ambulatory referral to Interventional Radiology   Meds ordered this encounter  Medications   HYDROcodone-acetaminophen (NORCO) 7.5-325 MG tablet    Sig: Take 1-2 tablets by mouth 2 (two) times daily as needed for moderate pain.    Dispense:  30 tablet    Refill:  0      Procedures: No procedures performed   Clinical Data: No additional findings.   Subjective: Chief Complaint  Patient presents with   Lower Back - Pain    HPI patient is a pleasant 87 year old female patient of Dr. Junius Roads who comes in today to discuss MRI results of the lumbar spine.  She sustained a fall around 3 months ago.  Pain did not improve and subsequent MRI was ordered.  MRI from 09/01/2020 shows a inferior endplate fracture at L1 with 25% height loss.  Patient continues to have pain back pain and is not relieved with Norco.  Of note, she has previously undergone kyphoplasty at T11 in 2019 which did seem to help.      Objective: Vital Signs:  There were no vitals taken for this visit.    Ortho Exam unchanged lumbar spine exam  Specialty Comments:  No specialty comments available.  Imaging: No new imaging   PMFS History: Patient Active Problem List   Diagnosis Date Noted   Acute pelvic pain 08/08/2020   Acute left-sided low back pain without sciatica 08/08/2020   Acute pain of right wrist 08/08/2020   Acute right-sided thoracic back pain 12/25/2019   Osteoporosis with current pathological fracture 03/27/2019   Decreased appetite 03/27/2019   Insomnia 09/11/2018   Urinary frequency 08/30/2018   Cough 05/01/2018   Urinary incontinence 03/21/2018   Irritation of external ear canal 09/28/2017   Compression fracture of body of thoracic vertebra ([facility]) 09/14/2017   Anxiety and depression 09/14/2017   Syncope and collapse 10/24/2012   Orthostatic hypotension 10/24/2012   Weakness generalized 01/21/2011   Dizziness 01/21/2011   COPD (chronic obstructive pulmonary disease) ([place]) 01/21/2011   Hypothyroidism 03/28/2008   DYSPNEA 03/28/2008   Past Medical History:  Diagnosis Date   Anxiety    Arthritis    "hands" (10/24/2012)   B12 deficiency    "get shots twice/month" (10/24/2012)   Bursitis    Depression    Diverticulitis  Exertional shortness of breath    Headache(784.0)    "weekly at least" (10/24/2012)   Hypothyroidism    Insomnia    Ovarian cancer ([place])    mass hooked to sm. intestine/bladder   Skin cancer of face    Syncope    when neck is up or turned to side   Syncope and collapse    "first time I ever I collapsed"; confirms h/o presyncopal events (10/24/2012)   UTI (lower urinary tract infection)     Family History  Problem Relation Age of Onset   Breast cancer Mother    Heart Problems Father    Heart attack Father     Past Surgical History:  Procedure Laterality Date   ABDOMINAL HYSTERECTOMY     CATARACT EXTRACTION W/ INTRAOCULAR LENS  IMPLANT, BILATERAL Bilateral 2000's   DILATION AND  CURETTAGE OF UTERUS     several   FOOT SURGERY Right 1943-1944   4 surgeries   IR KYPHO THORACIC WITH BONE BIOPSY  07/27/2017   THYROIDECTOMY     TONSILLECTOMY     Social History   Occupational History   Not on file  Tobacco Use   Smoking status: Never   Smokeless tobacco: Never  Vaping Use   Vaping Use: Never used  Substance and Sexual Activity   Alcohol use: No   Drug use: No   Sexual activity: Never

## 2020-10-07 ENCOUNTER — Telehealth (HOSPITAL_COMMUNITY): Payer: Self-pay

## 2020-10-07 NOTE — Telephone Encounter (Signed)
-----   Message from [name], MD sent at 10/07/2020  2:51 PM EDT ----- Regarding: RE: L1 KP You can schedule, thanks.   ----- Message ----- From: Danielle Dess Sent: 10/07/2020   2:09 PM EDT To: [name], MD Subject: Reola Calkins,   New referral for L1 KP. Please review.   Thanks,  Lia Foyer

## 2020-10-08 ENCOUNTER — Other Ambulatory Visit: Payer: Self-pay | Admitting: Physician Assistant

## 2020-10-14 ENCOUNTER — Other Ambulatory Visit (HOSPITAL_COMMUNITY): Payer: Self-pay | Admitting: Neuroradiology

## 2020-10-14 DIAGNOSIS — M51369 Other intervertebral disc degeneration, lumbar region without mention of lumbar back pain or lower extremity pain: Secondary | ICD-10-CM

## 2020-10-14 DIAGNOSIS — S32010A Wedge compression fracture of first lumbar vertebra, initial encounter for closed fracture: Secondary | ICD-10-CM

## 2020-10-14 DIAGNOSIS — M8000XS Age-related osteoporosis with current pathological fracture, unspecified site, sequela: Secondary | ICD-10-CM

## 2020-10-14 DIAGNOSIS — M5136 Other intervertebral disc degeneration, lumbar region: Secondary | ICD-10-CM

## 2020-10-14 DIAGNOSIS — M8000XA Age-related osteoporosis with current pathological fracture, unspecified site, initial encounter for fracture: Secondary | ICD-10-CM

## 2020-10-14 DIAGNOSIS — M545 Low back pain, unspecified: Secondary | ICD-10-CM

## 2020-10-14 DIAGNOSIS — M810 Age-related osteoporosis without current pathological fracture: Secondary | ICD-10-CM

## 2020-10-14 DIAGNOSIS — M1990 Unspecified osteoarthritis, unspecified site: Secondary | ICD-10-CM

## 2020-10-14 DIAGNOSIS — M8088XS Other osteoporosis with current pathological fracture, vertebra(e), sequela: Secondary | ICD-10-CM

## 2020-10-20 ENCOUNTER — Other Ambulatory Visit: Payer: Self-pay | Admitting: Radiology

## 2020-10-21 ENCOUNTER — Ambulatory Visit (HOSPITAL_COMMUNITY): Admission: RE | Admit: 2020-10-21 | Payer: Medicare Other | Source: Ambulatory Visit

## 2020-10-21 ENCOUNTER — Other Ambulatory Visit (HOSPITAL_COMMUNITY): Payer: Self-pay | Admitting: Physician Assistant

## 2020-10-21 ENCOUNTER — Other Ambulatory Visit: Payer: Self-pay | Admitting: Primary Care

## 2020-10-21 ENCOUNTER — Other Ambulatory Visit: Payer: Self-pay | Admitting: Radiology

## 2020-10-21 DIAGNOSIS — G4700 Insomnia, unspecified: Secondary | ICD-10-CM

## 2020-10-21 DIAGNOSIS — F419 Anxiety disorder, unspecified: Secondary | ICD-10-CM

## 2020-10-21 DIAGNOSIS — R630 Anorexia: Secondary | ICD-10-CM

## 2020-10-21 DIAGNOSIS — F32A Depression, unspecified: Secondary | ICD-10-CM

## 2020-10-22 ENCOUNTER — Encounter (HOSPITAL_COMMUNITY): Payer: Self-pay

## 2020-10-22 ENCOUNTER — Encounter: Payer: Self-pay | Admitting: Orthopaedic Surgery

## 2020-10-22 ENCOUNTER — Ambulatory Visit (HOSPITAL_COMMUNITY)
Admission: RE | Admit: 2020-10-22 | Discharge: 2020-10-22 | Disposition: A | Discharge status: Home / Self Care | Payer: Medicare Other | Source: Ambulatory Visit | Attending: Neuroradiology | Admitting: Neuroradiology

## 2020-10-22 ENCOUNTER — Other Ambulatory Visit: Payer: Self-pay

## 2020-10-22 DIAGNOSIS — G8929 Other chronic pain: Secondary | ICD-10-CM | POA: Diagnosis not present

## 2020-10-22 DIAGNOSIS — W19XXXA Unspecified fall, initial encounter: Secondary | ICD-10-CM | POA: Diagnosis not present

## 2020-10-22 DIAGNOSIS — Z79890 Hormone replacement therapy (postmenopausal): Secondary | ICD-10-CM | POA: Insufficient documentation

## 2020-10-22 DIAGNOSIS — Z880 Allergy status to penicillin: Secondary | ICD-10-CM | POA: Diagnosis not present

## 2020-10-22 DIAGNOSIS — M8000XS Age-related osteoporosis with current pathological fracture, unspecified site, sequela: Secondary | ICD-10-CM

## 2020-10-22 DIAGNOSIS — M4856XA Collapsed vertebra, not elsewhere classified, lumbar region, initial encounter for fracture: Secondary | ICD-10-CM | POA: Diagnosis not present

## 2020-10-22 DIAGNOSIS — Z79899 Other long term (current) drug therapy: Secondary | ICD-10-CM | POA: Insufficient documentation

## 2020-10-22 DIAGNOSIS — Z888 Allergy status to other drugs, medicaments and biological substances status: Secondary | ICD-10-CM | POA: Diagnosis not present

## 2020-10-22 DIAGNOSIS — M545 Low back pain, unspecified: Secondary | ICD-10-CM | POA: Diagnosis not present

## 2020-10-22 DIAGNOSIS — M8008XS Age-related osteoporosis with current pathological fracture, vertebra(e), sequela: Secondary | ICD-10-CM | POA: Insufficient documentation

## 2020-10-22 DIAGNOSIS — S32010A Wedge compression fracture of first lumbar vertebra, initial encounter for closed fracture: Secondary | ICD-10-CM | POA: Diagnosis not present

## 2020-10-22 HISTORY — PX: IR KYPHO LUMBAR INC FX REDUCE BONE BX UNI/BIL CANNULATION INC/IMAGING: IMG5519

## 2020-10-22 LAB — BASIC METABOLIC PANEL
Anion gap: 11 (ref 5–15)
BUN: 17 mg/dL (ref 8–23)
CO2: 23 mmol/L (ref 22–32)
Calcium: 9.5 mg/dL (ref 8.9–10.3)
Chloride: 103 mmol/L (ref 98–111)
Creatinine, Ser: 0.85 mg/dL (ref 0.44–1.00)
GFR, Estimated: >60 mL/min (ref >60)
Glucose, Bld: 111 mg/dL — ABNORMAL HIGH (ref 70–99)
Potassium: 4.3 mmol/L (ref 3.5–5.1)
Sodium: 137 mmol/L (ref 135–145)

## 2020-10-22 LAB — CBC
HCT: 38 % (ref 36.0–46.0)
Hemoglobin: 12 g/dL (ref 12.0–15.0)
MCH: 30.5 pg (ref 26.0–34.0)
MCHC: 31.6 g/dL (ref 30.0–36.0)
MCV: 96.4 fL (ref 80.0–100.0)
Platelets: 202 10*3/uL (ref 150–400)
RBC: 3.94 MIL/uL (ref 3.87–5.11)
RDW: 13.2 % (ref 11.5–15.5)
WBC: 4.7 10*3/uL (ref 4.0–10.5)
nRBC: 0 % (ref 0.0–0.2)

## 2020-10-22 LAB — PROTIME-INR
INR: 1 (ref 0.8–1.2)
Prothrombin Time: 13.3 seconds (ref 11.4–15.2)

## 2020-10-22 MED ORDER — SODIUM CHLORIDE 0.9 % IV SOLN: INTRAVENOUS | Status: DC

## 2020-10-22 MED ORDER — FENTANYL CITRATE (PF) 100 MCG/2ML IJ SOLN
INTRAMUSCULAR | Status: DC | PRN
  Administered 2020-10-22 (×2): 25 ug via INTRAVENOUS

## 2020-10-22 MED ORDER — LIDOCAINE HCL (PF) 1 % IJ SOLN
INTRAMUSCULAR | Status: DC | PRN
  Administered 2020-10-22: 20 mL

## 2020-10-22 MED ORDER — VANCOMYCIN HCL IN DEXTROSE 1-5 GM/200ML-% IV SOLN
INTRAVENOUS | Status: AC
  Administered 2020-10-22: 1000 mg via INTRAVENOUS
  Filled 2020-10-22: qty 200

## 2020-10-22 MED ORDER — MIDAZOLAM HCL 2 MG/2ML IJ SOLN
INTRAMUSCULAR | Status: DC | PRN
  Administered 2020-10-22 (×2): .5 mg via INTRAVENOUS

## 2020-10-22 MED ORDER — FENTANYL CITRATE (PF) 100 MCG/2ML IJ SOLN
INTRAMUSCULAR | Status: AC
  Filled 2020-10-22: qty 2

## 2020-10-22 MED ORDER — LIDOCAINE HCL 1 % IJ SOLN
INTRAMUSCULAR | Status: AC
  Filled 2020-10-22: qty 20

## 2020-10-22 MED ORDER — BUPIVACAINE HCL 0.25 % IJ SOLN
INTRAMUSCULAR | Status: DC | PRN
  Administered 2020-10-22: 20 mL

## 2020-10-22 MED ORDER — VANCOMYCIN HCL IN DEXTROSE 1-5 GM/200ML-% IV SOLN: 1000.0000 mg | INTRAVENOUS | Status: AC

## 2020-10-22 MED ORDER — IOHEXOL 300 MG/ML  SOLN: 100.0000 mL | Freq: Once | INTRAMUSCULAR | Status: DC | PRN

## 2020-10-22 MED ORDER — MIDAZOLAM HCL 2 MG/2ML IJ SOLN
INTRAMUSCULAR | Status: AC
  Filled 2020-10-22: qty 2

## 2020-10-22 MED ORDER — BUPIVACAINE HCL (PF) 0.5 % IJ SOLN
INTRAMUSCULAR | Status: AC
  Filled 2020-10-22: qty 30

## 2020-10-22 NOTE — Procedures (Signed)
INTERVENTIONAL NEURORADIOLOGY BRIEF POSTPROCEDURE NOTE  FLUOROSCOPY GUIDED L1 CORE BIOPSY AND KYPHOPLASTY   Attending: Dr. [name]  Assistant: None.  Diagnosis: L1 fragility fracture.  Access site: Percutaneous, bilateral transpedicular.  Anesthesia: Moderate sedation.  Medication used: 1 mg Versed IV; 50 mcg Fentanyl IV.  Complications: None.  Estimated blood loss: Negligible.  Specimen: 1 core biopsy semple sent for tissue exam.  Findings: L1 inferior endplate compression fracture.  The patient tolerated the procedure well without incident or complication and is in stable condition.

## 2020-10-22 NOTE — H&P (Signed)
Chief Complaint: Patient was seen in consultation today for Lumbar 1 Kyphoplasty at the request of [name]   Supervising Physician: [name]  Patient Status: [name] - Out-pt  History of Present Illness: [name] is a 87 y.o. female  Chronic back pain Has had T 11 KP 07/2017-- did well  [place] in July 2022 Worsening back pain since then--- meds no help Unable to get around as well as prior to back pain  MRI 09/20/20 IMPRESSION: Acute/subacute inferior endplate fracture at L1 with loss of height of 25%. Old augmented fracture at T11, partially visualized, without residual edema. Ordinary degenerative scratch set scoliotic curvature convex to the right with the apex at L1-2.  Scheduled now for Kyphoplasty L1   Past Medical History:  Diagnosis Date   Anxiety    Arthritis    "hands" (10/24/2012)   B12 deficiency    "get shots twice/month" (10/24/2012)   Bursitis    Depression    Diverticulitis    Exertional shortness of breath    Headache(784.0)    "weekly at least" (10/24/2012)   Hypothyroidism    Insomnia    Ovarian cancer ([place])    mass hooked to sm. intestine/bladder   Skin cancer of face    Syncope    when neck is up or turned to side   Syncope and collapse    "first time I ever I collapsed"; confirms h/o presyncopal events (10/24/2012)   UTI (lower urinary tract infection)     Past Surgical History:  Procedure Laterality Date   ABDOMINAL HYSTERECTOMY     CATARACT EXTRACTION W/ INTRAOCULAR LENS  IMPLANT, BILATERAL Bilateral 2000's   DILATION AND CURETTAGE OF UTERUS     several   FOOT SURGERY Right 1943-1944   4 surgeries   IR KYPHO THORACIC WITH BONE BIOPSY  07/27/2017   THYROIDECTOMY     TONSILLECTOMY      Allergies: Penicillins and Prednisone  Medications: Prior to Admission medications   Medication Sig Start Date End Date Taking? Authorizing Provider  acetaminophen (TYLENOL) 650 MG CR tablet Take 1,300 mg  by mouth every 8 (eight) hours as needed for pain.    [provider]  ADVAIR DISKUS 250-50 MCG/DOSE AEPB INHALE 1 PUFF INTO THE LUNGS 2 TIMES DAILY. Patient taking differently: Inhale 1 puff into the lungs 2 (two) times daily as needed (shortness of breath). 05/02/20   Pleas Koch, NP  albuterol (PROVENTIL) (2.5 MG/3ML) 0.083% nebulizer solution Take 3 mLs (2.5 mg total) by nebulization every 6 (six) hours as needed for wheezing or shortness of breath. 11/08/17   Pleas Koch, NP  alendronate (FOSAMAX) 70 MG tablet TAKE 1 TABLET BY MOUTH ONCE A WEEK. TAKE WITH A FULL GLASS OF WATER ON AN EMPTY STOMACH. MONDAYS 03/24/20   Pleas Koch, NP  Cholecalciferol (VITAMIN D) 2000 units tablet Take 2,000 Units by mouth daily.    [provider]  Cyanocobalamin (B-12) 5000 MCG CAPS Take 5,000 mcg by mouth daily.    [provider]  docusate sodium (COLACE) 100 MG capsule Take 100 mg by mouth daily.    [provider]  ferrous sulfate 325 (65 FE) MG tablet Take 325 mg by mouth daily.    [provider]  HYDROcodone-acetaminophen (NORCO) 7.5-325 MG tablet Take 1-2 tablets by mouth 2 (two) times daily as needed for moderate pain. 09/30/20   Aundra Dubin, PA-C  mirtazapine (REMERON) 15 MG tablet TAKE 1/2 TO  1 TABLET BY MOUTH AT BEDTIME FOR SLEEP AND APPETITE. 10/22/20   Pleas Koch, NP  sertraline (ZOLOFT) 100 MG tablet TAKE 1 TABLET BY MOUTH DAILY FOR ANXIETY AND DEPRESSION. 08/28/20   Pleas Koch, NP  SYNTHROID 100 MCG tablet Take 1 tablet by mouth on empty stomach with water only on Sunday through Thursday, no food or other medicine for 30 minutes. 02/11/20   Pleas Koch, NP  SYNTHROID 112 MCG tablet Take 1 tablet by mouth every morning on empty stomach with water only on Friday and Saturday, no food or other medicines for 30 minutes. 02/11/20   Pleas Koch, NP     Family History  Problem Relation Age of Onset   Breast  cancer Mother    Heart Problems Father    Heart attack Father     Social History   Socioeconomic History   Marital status: Widowed    Spouse name: Not on file   Number of children: Not on file   Years of education: Not on file   Highest education level: Not on file  Occupational History   Not on file  Tobacco Use   Smoking status: Never   Smokeless tobacco: Never  Vaping Use   Vaping Use: Never used  Substance and Sexual Activity   Alcohol use: No   Drug use: No   Sexual activity: Never  Other Topics Concern   Not on file  Social History Narrative   Not on file   Social Determinants of Health   Financial Resource Strain: Low Risk    Difficulty of Paying Living Expenses: Not hard at all  Food Insecurity: No Food Insecurity   Worried About Charity fundraiser in the Last Year: Never true   [place] in the Last Year: Never true  Transportation Needs: No Transportation Needs   Lack of Transportation (Medical): No   Lack of Transportation (Non-Medical): No  Physical Activity: Inactive   Days of Exercise per Week: 0 days   Minutes of Exercise per Session: 0 min  Stress: Stress Concern Present   Feeling of Stress : To some extent  Social Connections: Not on file    Review of Systems: A 12 point ROS discussed and pertinent positives are indicated in the HPI above.  All other systems are negative.  Review of Systems  Constitutional:  Positive for activity change. Negative for fatigue.  Respiratory:  Negative for cough and shortness of breath.   Cardiovascular:  Negative for chest pain.  Gastrointestinal:  Negative for abdominal pain.  Musculoskeletal:  Positive for back pain.  Psychiatric/Behavioral:  Negative for behavioral problems and confusion.    Vital Signs: BP (!) 148/81   Pulse 79   Temp 98.3 F (36.8 C) (Oral)   Resp (!) 21   Ht 5' (1.524 m)   Wt 105 lb (47.6 kg)   SpO2 98%   BMI 20.51 kg/m   Physical Exam Vitals reviewed.  HENT:      Mouth/Throat:     Mouth: Mucous membranes are moist.  Cardiovascular:     Rate and Rhythm: Normal rate and regular rhythm.     Heart sounds: Normal heart sounds.  Pulmonary:     Effort: Pulmonary effort is normal.     Breath sounds: Normal breath sounds.  Abdominal:     Palpations: Abdomen is soft.  Musculoskeletal:        General: Normal range of motion.     Comments: Low  back pain  Skin:    General: Skin is warm.  Neurological:     Mental Status: She is alert and oriented to person, place, and time.  Psychiatric:        Behavior: Behavior normal.    Imaging: No results found.  Labs:  CBC: Recent Labs    12/04/19 1225 12/25/19 1200  WBC 4.0 4.7  HGB 11.3* 11.5*  HCT 33.8* 34.8*  PLT 247.0 233.0    COAGS: No results for input(s): INR, APTT in the last 8760 hours.  BMP: Recent Labs    12/04/19 1225 12/25/19 1200  NA 135 135  K 4.2 4.0  CL 102 100  CO2 26 27  GLUCOSE 90 79  BUN 18 16  CALCIUM 9.4 9.3  CREATININE 0.70 0.79    LIVER FUNCTION TESTS: No results for input(s): BILITOT, AST, ALT, ALKPHOS, PROT, ALBUMIN in the last 8760 hours.  TUMOR MARKERS: No results for input(s): AFPTM, CEA, CA199, CHROMGRNA in the last 8760 hours.  Assessment and Plan:  Worsening back pain-- meds no help Lumbar 1 acute fracture Scheduled for L1 KP today Risks and benefits of Lumbar 1 kyphoplasty were discussed with the patient including, but not limited to education regarding the natural healing process of compression fractures without intervention, bleeding, infection, cement migration which may cause spinal cord damage, paralysis, pulmonary embolism or even death.  This interventional procedure involves the use of X-rays and because of the nature of the planned procedure, it is possible that we will have prolonged use of X-ray fluoroscopy.  Potential radiation risks to you include (but are not limited to) the following: - A slightly elevated risk for cancer  several  years later in life. This risk is typically less than 0.5% percent. This risk is low in comparison to the normal incidence of human cancer, which is 33% for women and 50% for men according to the [place]. - Radiation induced injury can include skin redness, resembling a rash, tissue breakdown / ulcers and hair loss (which can be temporary or permanent).   The likelihood of either of these occurring depends on the difficulty of the procedure and whether you are sensitive to radiation due to previous procedures, disease, or genetic conditions.   IF your procedure requires a prolonged use of radiation, you will be notified and given written instructions for further action.  It is your responsibility to monitor the irradiated area for the 2 weeks following the procedure and to notify your physician if you are concerned that you have suffered a radiation induced injury.    All of the patient's questions were answered, patient is agreeable to proceed.  Consent signed and in chart.   Thank you for this interesting consult.  I greatly enjoyed meeting BORGHILD THAKER and look forward to participating in their care.  A copy of this report was sent to the requesting provider on this date.  Electronically Signed: Lavonia Drafts, PA-C 10/22/2020, 10:25 AM   I spent a total of    25 Minutes in face to face in clinical consultation, greater than 50% of which was counseling/coordinating care for L1 KP

## 2020-10-23 ENCOUNTER — Other Ambulatory Visit: Payer: Self-pay | Admitting: Physician Assistant

## 2020-10-23 MED ORDER — HYDROCODONE-ACETAMINOPHEN 7.5-325 MG PO TABS: 1.0000 | ORAL_TABLET | Freq: Two times a day (BID) | ORAL | 0 refills | Status: DC | PRN

## 2020-10-23 NOTE — Telephone Encounter (Signed)
Sent in

## 2020-10-27 LAB — SURGICAL PATHOLOGY

## 2020-11-13 ENCOUNTER — Other Ambulatory Visit: Payer: Self-pay | Admitting: Physician Assistant

## 2020-12-11 ENCOUNTER — Encounter: Payer: Self-pay | Admitting: Primary Care

## 2020-12-11 ENCOUNTER — Other Ambulatory Visit: Payer: Self-pay

## 2020-12-11 ENCOUNTER — Telehealth (INDEPENDENT_AMBULATORY_CARE_PROVIDER_SITE_OTHER): Payer: Medicare Other | Admitting: Primary Care

## 2020-12-11 DIAGNOSIS — D649 Anemia, unspecified: Secondary | ICD-10-CM | POA: Insufficient documentation

## 2020-12-11 DIAGNOSIS — E039 Hypothyroidism, unspecified: Secondary | ICD-10-CM

## 2020-12-11 DIAGNOSIS — R0609 Other forms of dyspnea: Secondary | ICD-10-CM | POA: Diagnosis not present

## 2020-12-11 DIAGNOSIS — R5383 Other fatigue: Secondary | ICD-10-CM

## 2020-12-11 DIAGNOSIS — R739 Hyperglycemia, unspecified: Secondary | ICD-10-CM

## 2020-12-11 DIAGNOSIS — R531 Weakness: Secondary | ICD-10-CM

## 2020-12-11 NOTE — Patient Instructions (Signed)
Please go to a [facility] facility for labs as discussed.  I will be in touch with results once received.   It was a pleasure to see you today!

## 2020-12-11 NOTE — Assessment & Plan Note (Addendum)
Increased x 1 month. Exam today limited given virtual platform.   Checking labs today including TSH, CMP, A1C, BNP, CBC with diff, BNP, UA.  Could be secondary to deconditioning as she has been very sedentary since July 2022.  Await results. She appears stable.

## 2020-12-11 NOTE — Progress Notes (Signed)
Patient ID: [name], female    DOB: [birth year], 87 y.o.   MRN: [id number]  Virtual visit completed through [place], a video enabled telemedicine application. Due to national recommendations of social distancing due to COVID-19, a virtual visit is felt to be most appropriate for this patient at this time. Reviewed limitations, risks, security and privacy concerns of performing a virtual visit and the availability of in person appointments. I also reviewed that there may be a patient responsible charge related to this service. The patient agreed to proceed.   Patient location: home Provider location: [place] at [facility], office Persons participating in this virtual visit: patient, provider, daughter  If any vitals were documented, they were collected by patient at home unless specified below.    There were no vitals taken for this visit.   CC: Fatigue Subjective:   HPI: [name] is a 87 y.o. female with a history of orthostatic hypotension, hypothyroidism, COPD, osteoporosis with compression fracture, generalized weakness, dizziness, syncope and collapse, decreased appetite presenting on 12/11/2020 for Fatigue (For about 2 weeks increased over time. )  Her daughter is with her today and is providing most of the information for HPI.  The patient endorses "not feeling good at all" with weakness and feeling fatigued. Her daughter endorses noticing increased fatigue and weakness, "shaky", and is getting up a lot slower than usual. She's been immobile for the most part since she "broke her back" in July 2022. She did twist her back a month ago, no falls or injury.   Her daughter denies falls, acute confusion, urinary changes. The patient is eating and drinking per usual, good appetite and no decline.       Relevant past medical, surgical, family and social history reviewed and updated as indicated. Interim medical history since our last visit reviewed. Allergies and  medications reviewed and updated. Outpatient Medications Prior to Visit  Medication Sig Dispense Refill   acetaminophen (TYLENOL) 650 MG CR tablet Take 1,300 mg by mouth every 8 (eight) hours as needed for pain.     ADVAIR DISKUS 250-50 MCG/DOSE AEPB INHALE 1 PUFF INTO THE LUNGS 2 TIMES DAILY. (Patient taking differently: Inhale 1 puff into the lungs 2 (two) times daily as needed (shortness of breath).) 60 each 2   albuterol (PROVENTIL) (2.5 MG/3ML) 0.083% nebulizer solution Take 3 mLs (2.5 mg total) by nebulization every 6 (six) hours as needed for wheezing or shortness of breath. 150 mL 0   alendronate (FOSAMAX) 70 MG tablet TAKE 1 TABLET BY MOUTH ONCE A WEEK. TAKE WITH A FULL GLASS OF WATER ON AN EMPTY STOMACH. MONDAYS 12 tablet 3   Cholecalciferol (VITAMIN D) 2000 units tablet Take 2,000 Units by mouth daily.     Cyanocobalamin (B-12) 5000 MCG CAPS Take 5,000 mcg by mouth daily.     docusate sodium (COLACE) 100 MG capsule Take 100 mg by mouth daily.     ferrous sulfate 325 (65 FE) MG tablet Take 325 mg by mouth daily.     mirtazapine (REMERON) 15 MG tablet TAKE 1/2 TO 1 TABLET BY MOUTH AT BEDTIME FOR SLEEP AND APPETITE. 90 tablet 0   sertraline (ZOLOFT) 100 MG tablet TAKE 1 TABLET BY MOUTH DAILY FOR ANXIETY AND DEPRESSION. 90 tablet 1   SYNTHROID 100 MCG tablet Take 1 tablet by mouth on empty stomach with water only on Sunday through Thursday, no food or other medicine for 30 minutes. 60 tablet 3   SYNTHROID 112 MCG tablet  Take 1 tablet by mouth every morning on empty stomach with water only on Friday and Saturday, no food or other medicines for 30 minutes. 24 tablet 3   HYDROcodone-acetaminophen (NORCO) 7.5-325 MG tablet Take 1 tablet by mouth 2 (two) times daily as needed for moderate pain. 30 tablet 0   No facility-administered medications prior to visit.     Per HPI unless specifically indicated in ROS section below Review of Systems Objective:  There were no vitals taken for this  visit.  Wt Readings from Last 3 Encounters:  10/22/20 105 lb (47.6 kg)  09/12/20 107 lb (48.5 kg)  08/08/20 108 lb (49 kg)       Physical exam: General: Alert and oriented x 3, no distress, does not appear sickly  Pulmonary: Speaks in complete sentences without increased work of breathing, no cough during visit.  Psychiatric: Normal mood, thought content, and behavior.     Results for orders placed or performed during the hospital encounter of 10/22/20  CBC  Result Value Ref Range   WBC 4.7 4.0 - 10.5 K/uL   RBC 3.94 3.87 - 5.11 MIL/uL   Hemoglobin 12.0 12.0 - 15.0 g/dL   HCT 38.0 36.0 - 46.0 %   MCV 96.4 80.0 - 100.0 fL   MCH 30.5 26.0 - 34.0 pg   MCHC 31.6 30.0 - 36.0 g/dL   RDW 13.2 11.5 - 15.5 %   Platelets 202 150 - 400 K/uL   nRBC 0.0 0.0 - 0.2 %  Protime-INR  Result Value Ref Range   Prothrombin Time 13.3 11.4 - 15.2 seconds   INR 1.0 0.8 - 1.2  Basic metabolic panel  Result Value Ref Range   Sodium 137 135 - 145 mmol/L   Potassium 4.3 3.5 - 5.1 mmol/L   Chloride 103 98 - 111 mmol/L   CO2 23 22 - 32 mmol/L   Glucose, Bld 111 (H) 70 - 99 mg/dL   BUN 17 8 - 23 mg/dL   Creatinine, Ser 0.85 0.44 - 1.00 mg/dL   Calcium 9.5 8.9 - 10.3 mg/dL   GFR, Estimated >60 >60 mL/min   Anion gap 11 5 - 15  Surgical pathology  Result Value Ref Range   SURGICAL PATHOLOGY      SURGICAL PATHOLOGY CASE: [id number] PATIENT: Dan Maker Surgical Pathology Report     Clinical History: compression fracture (cm)     FINAL MICROSCOPIC DIAGNOSIS:  A. BONE, L1, BIOPSY: - Benign bone, fibrous tissue and marrow tissue. - See comment.  COMMENT:  The specimen consist of bone with associated fibrous tissue and small amount of hematopoietic marrow tissue.  Immunohistochemistry for CD45 (LCA) highlights the hematopoietic elements.  Immunohistochemistry for cytokeratin AE1/AE3 shows a microscopic fragment with positivity which likely represents a small portion of skin  sampled during the biopsy procedure.  Dr. Vic Ripper agrees.   GROSS DESCRIPTION:  Received in formalin is a 0.4 x 0.2 cm core of tan bone.  The specimen is submitted in toto following decalcification (Immunocal).  [facility] 10/22/2020)   Final Diagnosis performed by Claudette Laws, MD.   Electronically signed 10/27/2020 Technical component performed at [facility]. [facility], [place] [address], [place], [place] [address].  Professional component performed at [facility], [place] [address] [place], [place] [address].  Immunohistochemistry Technical component (if applicable) was performed at [facility]. [address] [place], [place],  [address].   IMMUNOHISTOCHEMISTRY DISCLAIMER (if applicable): Some of these immunohistochemical stains may have  been developed and the performance characteristics determine by [facility]. Some may not have been cleared or approved by the U.S. Food and Drug Administration. The FDA has determined that such clearance or approval is not necessary. This test is used for clinical purposes. It should not be regarded as investigational or for research. This laboratory is certified under the [place] (CLIA-88) as qualified to perform high complexity clinical laboratory testing.  The controls stained appropriately.    Assessment & Plan:   Problem List Items Addressed This Visit       Endocrine   Hypothyroidism - Primary    Repeat TSH pending.      Relevant Orders   TSH     Other   Other fatigue    Increased x 1 month. Exam today limited given virtual platform.   Checking labs today including TSH, CMP, A1C, BNP, CBC with diff, BNP, UA.  Could be secondary to deconditioning as she has been very sedentary since July 2022.  Await results. She appears stable.       Relevant Orders   Hemoglobin A1c   CBC with Differential/Platelet    Comprehensive metabolic panel   Urinalysis, Routine w reflex microscopic   Anemia    Noted on prior labs.  CBC and IBC panels pending.      Relevant Orders   Iron and TIBC   Other Visit Diagnoses     Hyperglycemia       Relevant Orders   Hemoglobin A1c   Dyspnea on exertion       Relevant Orders   Brain natriuretic peptide        No orders of the defined types were placed in this encounter.  Orders Placed This Encounter  Procedures   Hemoglobin A1c   CBC with Differential/Platelet   Comprehensive metabolic panel   TSH   Brain natriuretic peptide   Iron and TIBC   Urinalysis, Routine w reflex microscopic    I discussed the assessment and treatment plan with the patient. The patient was provided an opportunity to ask questions and all were answered. The patient agreed with the plan and demonstrated an understanding of the instructions. The patient was advised to call back or seek an in-person evaluation if the symptoms worsen or if the condition fails to improve as anticipated.  Follow up plan:  Please go to a [facility] facility for labs as discussed.  I will be in touch with results once received.   It was a pleasure to see you today!   Pleas Koch, NP

## 2020-12-11 NOTE — Assessment & Plan Note (Signed)
Repeat TSH pending

## 2020-12-11 NOTE — Assessment & Plan Note (Signed)
Noted on prior labs.  CBC and IBC panels pending.

## 2020-12-12 ENCOUNTER — Other Ambulatory Visit: Payer: Self-pay | Admitting: Primary Care

## 2020-12-12 DIAGNOSIS — N3001 Acute cystitis with hematuria: Secondary | ICD-10-CM

## 2020-12-12 LAB — COMPREHENSIVE METABOLIC PANEL
ALT: 7 IU/L (ref 0–32)
AST: 14 IU/L (ref 0–40)
Albumin/Globulin Ratio: 2 (ref 1.2–2.2)
Albumin: 4.5 g/dL (ref 3.5–4.6)
Alkaline Phosphatase: 53 IU/L (ref 44–121)
BUN/Creatinine Ratio: 20 (ref 12–28)
BUN: 17 mg/dL (ref 10–36)
Bilirubin Total: 0.3 mg/dL (ref 0.0–1.2)
CO2: 23 mmol/L (ref 20–29)
Calcium: 9.5 mg/dL (ref 8.7–10.3)
Chloride: 104 mmol/L (ref 96–106)
Creatinine, Ser: 0.83 mg/dL (ref 0.57–1.00)
Globulin, Total: 2.2 g/dL (ref 1.5–4.5)
Glucose: 109 mg/dL — ABNORMAL HIGH (ref 70–99)
Potassium: 3.9 mmol/L (ref 3.5–5.2)
Sodium: 142 mmol/L (ref 134–144)
Total Protein: 6.7 g/dL (ref 6.0–8.5)
eGFR: 67 mL/min/{1.73_m2} (ref >59)

## 2020-12-12 LAB — CBC WITH DIFFERENTIAL/PLATELET
Basophils Absolute: 0 10*3/uL (ref 0.0–0.2)
Basos: 1 %
EOS (ABSOLUTE): 0.2 10*3/uL (ref 0.0–0.4)
Eos: 4 %
Hematocrit: 36.4 % (ref 34.0–46.6)
Hemoglobin: 12 g/dL (ref 11.1–15.9)
Immature Grans (Abs): 0 10*3/uL (ref 0.0–0.1)
Immature Granulocytes: 0 %
Lymphocytes Absolute: 1.3 10*3/uL (ref 0.7–3.1)
Lymphs: 27 %
MCH: 30.9 pg (ref 26.6–33.0)
MCHC: 33 g/dL (ref 31.5–35.7)
MCV: 94 fL (ref 79–97)
Monocytes Absolute: 0.5 10*3/uL (ref 0.1–0.9)
Monocytes: 11 %
Neutrophils Absolute: 2.8 10*3/uL (ref 1.4–7.0)
Neutrophils: 57 %
Platelets: 202 10*3/uL (ref 150–450)
RBC: 3.88 x10E6/uL (ref 3.77–5.28)
RDW: 13 % (ref 11.7–15.4)
WBC: 4.9 10*3/uL (ref 3.4–10.8)

## 2020-12-12 LAB — URINALYSIS, ROUTINE W REFLEX MICROSCOPIC
Bilirubin, UA: NEGATIVE
Glucose, UA: NEGATIVE
Nitrite, UA: NEGATIVE
Specific Gravity, UA: >=1.03 — AB (ref 1.005–1.030)
Urobilinogen, Ur: 1 mg/dL (ref 0.2–1.0)
pH, UA: 5.5 (ref 5.0–7.5)

## 2020-12-12 LAB — HEMOGLOBIN A1C
Est. average glucose Bld gHb Est-mCnc: 103 mg/dL
Hgb A1c MFr Bld: 5.2 % (ref 4.8–5.6)

## 2020-12-12 LAB — TSH: TSH: 2.94 u[IU]/mL (ref 0.450–4.500)

## 2020-12-12 LAB — IRON AND TIBC
Iron Saturation: 49 % (ref 15–55)
Iron: 133 ug/dL (ref 27–139)
Total Iron Binding Capacity: 272 ug/dL (ref 250–450)
UIBC: 139 ug/dL (ref 118–369)

## 2020-12-12 LAB — MICROSCOPIC EXAMINATION
Epithelial Cells (non renal): NONE SEEN /hpf (ref 0–10)
WBC, UA: >30 /hpf — AB (ref 0–5)

## 2020-12-12 LAB — BRAIN NATRIURETIC PEPTIDE: BNP: 69 pg/mL (ref 0.0–100.0)

## 2020-12-12 MED ORDER — SULFAMETHOXAZOLE-TRIMETHOPRIM 800-160 MG PO TABS
1.0000 | ORAL_TABLET | Freq: Two times a day (BID) | ORAL | 0 refills | Status: DC
Start: 2020-12-12 — End: 2021-02-23

## 2020-12-13 LAB — URINE CULTURE

## 2020-12-13 LAB — SPECIMEN STATUS REPORT

## 2021-01-01 ENCOUNTER — Telehealth: Payer: Self-pay | Admitting: Primary Care

## 2021-01-01 NOTE — Chronic Care Management (AMB) (Signed)
°  Chronic Care Management   Outreach Note  01/01/2021 Name: ZANIA KALISZ MRN: [id number] DOB: 12/02/1928  Referred by: Pleas Koch, NP Reason for referral : No chief complaint on file.   An unsuccessful telephone outreach was attempted today. The patient was referred to the pharmacist for assistance with care management and care coordination.   Follow Up Plan:   Tatjana Dellinger Upstream Scheduler

## 2021-01-06 ENCOUNTER — Telehealth: Payer: Self-pay | Admitting: Primary Care

## 2021-01-06 NOTE — Chronic Care Management (AMB) (Signed)
°  Chronic Care Management   Note  01/06/2021 Name: [name] MRN: [id number] DOB: Oct 31, 1928  [name] is a 87 y.o. year old female who is a primary care patient of Pleas Koch, NP. I reached out to [employer] by phone today in response to a referral sent by [name]'s PCP, Pleas Koch, NP.   Ms. Wisdom was given information about Chronic Care Management services today including:  CCM service includes personalized support from designated clinical staff supervised by her physician, including individualized plan of care and coordination with other care providers 24/7 contact phone numbers for assistance for urgent and routine care needs. Service will only be billed when office clinical staff spend 20 minutes or more in a month to coordinate care. Only one practitioner may furnish and bill the service in a calendar month. The patient may stop CCM services at any time (effective at the end of the month) by phone call to the office staff.   [name]/ DAUGHTER verbally agreed to assistance and services provided by embedded care coordination/care management team today.  Follow up plan: [place]

## 2021-01-08 ENCOUNTER — Other Ambulatory Visit: Payer: Self-pay | Admitting: Orthopedic Surgery

## 2021-01-08 ENCOUNTER — Encounter: Payer: Self-pay | Admitting: Orthopaedic Surgery

## 2021-01-08 MED ORDER — HYDROCODONE-ACETAMINOPHEN 5-325 MG PO TABS: 1.0000 | ORAL_TABLET | Freq: Four times a day (QID) | ORAL | 0 refills | Status: DC | PRN

## 2021-01-15 ENCOUNTER — Other Ambulatory Visit: Payer: Self-pay | Admitting: Primary Care

## 2021-01-15 DIAGNOSIS — F419 Anxiety disorder, unspecified: Secondary | ICD-10-CM

## 2021-01-15 DIAGNOSIS — F32A Depression, unspecified: Secondary | ICD-10-CM

## 2021-01-15 DIAGNOSIS — N3001 Acute cystitis with hematuria: Secondary | ICD-10-CM

## 2021-01-20 ENCOUNTER — Other Ambulatory Visit: Payer: Self-pay | Admitting: Primary Care

## 2021-01-20 DIAGNOSIS — F419 Anxiety disorder, unspecified: Secondary | ICD-10-CM

## 2021-01-20 DIAGNOSIS — R630 Anorexia: Secondary | ICD-10-CM

## 2021-01-20 DIAGNOSIS — G4700 Insomnia, unspecified: Secondary | ICD-10-CM

## 2021-02-02 ENCOUNTER — Other Ambulatory Visit: Payer: Self-pay | Admitting: Primary Care

## 2021-02-02 DIAGNOSIS — E039 Hypothyroidism, unspecified: Secondary | ICD-10-CM

## 2021-02-06 ENCOUNTER — Encounter: Payer: Self-pay | Admitting: Orthopaedic Surgery

## 2021-02-06 ENCOUNTER — Telehealth: Payer: Self-pay

## 2021-02-06 NOTE — Telephone Encounter (Signed)
Error

## 2021-02-09 ENCOUNTER — Other Ambulatory Visit: Payer: Self-pay | Admitting: Physician Assistant

## 2021-02-09 MED ORDER — HYDROCODONE-ACETAMINOPHEN 5-325 MG PO TABS: 1.0000 | ORAL_TABLET | Freq: Two times a day (BID) | ORAL | 0 refills | Status: DC | PRN

## 2021-02-09 NOTE — Telephone Encounter (Signed)
Talked with patient's daughter Sunday Spillers and advised her that Rx was sent to patient's pharmacy.  Voiced that she understands.

## 2021-02-09 NOTE — Telephone Encounter (Signed)
Sent!

## 2021-02-17 NOTE — Progress Notes (Signed)
Chronic Care Management Pharmacy Note  02/23/2021 Name:  [name] MRN:  [id number] DOB:  05/16/1928  Summary: Initial CCM visit. No barriers to adherence identified. Daughter places medications out for patient daily. Med list is accurate. Last PCP visit 12/2020. COPD, controlled on Advair PRN. Uses less than once weekly. Rare albuterol nebulizer use. Osteoporosis on Fosamax weekly and vitamin D supplement. Denies recent falls. Uses walker at all times. Anxiety/depression controlled on sertraline and mirtazapine. Appetite and weight stable. Sleeping well. Hypothyroidism, stable. Taking Synthroid correctly. She denies any health or medication concerns.  Recommendations/Changes made from today's visit: None - follow up with PCP routinely   Plan: CCM PharmD follow up 12 months   Subjective: [name] is an 88 y.o. year old female who is a primary patient of Pleas Koch, NP.  The CCM team was consulted for assistance with disease management and care coordination needs.    Engaged with patient by telephone for initial visit in response to provider referral for pharmacy case management and/or care coordination services.   Consent to Services:  The patient was given the following information about Chronic Care Management services today, agreed to services, and gave verbal consent: 1. CCM service includes personalized support from designated clinical staff supervised by the primary care provider, including individualized plan of care and coordination with other care providers 2. 24/7 contact phone numbers for assistance for urgent and routine care needs. 3. Service will only be billed when office clinical staff spend 20 minutes or more in a month to coordinate care. 4. Only one practitioner may furnish and bill the service in a calendar month. 5.The patient may stop CCM services at any time (effective at the end of the month) by phone call to the office staff. 6. The patient will be  responsible for cost sharing (co-pay) of up to 20% of the service fee (after annual deductible is met). Patient agreed to services and consent obtained.  Patient Care Team: Pleas Koch, NP as PCP - General (Internal Medicine) End, Harrell Gave, MD as PCP - Cardiology (Cardiology) Debbora Dus, [facility] as Pharmacist (Pharmacist)   Recent office visits: 12/11/2020 - Alma Friendly, NP - Video Visit - Patient presented for fatigue. Labs: CMP, Microscopic exam, Urinalysis, Brain Natriuretic peptide, CBC, A1c, Iron and TIBC, TSH and Urine Culture. Stop due to completed course: HYDROcodone-acetaminophen (NORCO) 7.5-325 MG tablet. Start: sulfamethoxazole-trimethoprim (BACTRIM DS) 800-160 MG tablet - Take 1 tablet by mouth 2 times daily.  09/12/2020 - Alma Friendly, NP - Patient presented for Polyuria and Dysuria. Labs: POCT Urinalysis and Urine Culture. Unable to provide urine specimen; sent home with patient.    Recent consult visits:  01/08/2021 - Frankey Shown, MD - Patient Message - Patient's daughter wrote in as patient fell and hurt her back again (broke her back in July and had cement injected in her back). Patient is wanting pain meds. Patient told daughter she was not going to have anything else done at 88 years old. Start: Hydrocodone-acetaminophen (NORCO/VICODIN) 5-325 MG tablet  09/30/2020 - Frankey Shown, MD - Orthopedic Surgery - Patient presented for lower back pain. Ordered: Referral to Interventional Radiology. Start: HYDROcodone-acetaminophen (NORCO) 7.5-325 MG tablet - 1-2 Tablets 2 times daily PRN.  09/20/2020 - Radiology - Patient presented for MR Lumbar Spine.  09/01/2020 - Eunice Blase, MD - Family Medicine - Patient presented for lower back pain. Ordered: MR Lumbar Spine. Change: HYDROcodone-acetaminophen (NORCO/VICODIN) 5-325 MG tablet - Take 1-2 tablets by mouth at bedtime  as needed for moderate pain vs. 1 tablet every 4 hours PRN.    Hospital visits:  None in previous 6  months   Objective:  Lab Results  Component Value Date   CREATININE 0.83 12/11/2020   BUN 17 12/11/2020   GFR 66.33 12/25/2019   EGFR 67 12/11/2020   GFRNONAA >60 10/22/2020   GFRAA >60 09/06/2018   NA 142 12/11/2020   K 3.9 12/11/2020   CALCIUM 9.5 12/11/2020   CO2 23 12/11/2020   GLUCOSE 109 (H) 12/11/2020    Lab Results  Component Value Date/Time   HGBA1C 5.2 12/11/2020 02:13 PM   HGBA1C 5.8 (H) 01/22/2011 12:13 AM   GFR 66.33 12/25/2019 12:00 PM   GFR 76.73 12/04/2019 12:25 PM    Lab Results  Component Value Date   CHOL 188 03/27/2019   HDL 57.30 03/27/2019   LDLCALC 121 (H) 03/27/2019   TRIG 50.0 03/27/2019   CHOLHDL 3 03/27/2019    Hepatic Function Latest Ref Rng & Units 12/11/2020 03/27/2019 03/15/2018  Total Protein 6.0 - 8.5 g/dL 6.7 6.9 7.2  Albumin 3.5 - 4.6 g/dL 4.5 4.1 4.4  AST 0 - 40 IU/L _0 ALT 0 - 32 IU/L _1 Alk Phosphatase 44 - 121 IU/L 53 35(L) 42  Total Bilirubin 0.0 - 1.2 mg/dL 0.3 0.3 0.6    Lab Results  Component Value Date/Time   TSH 2.940 12/11/2020 02:13 PM   TSH 0.80 02/04/2020 12:59 PM   FREET4 1.51 11/07/2010 07:00 AM    CBC Latest Ref Rng & Units 12/11/2020 10/22/2020 12/25/2019  WBC 3.4 - 10.8 x10E3/uL 4.9 4.7 4.7  Hemoglobin 11.1 - 15.9 g/dL 12.0 12.0 11.5(L)  Hematocrit 34.0 - 46.6 % 36.4 38.0 34.8(L)  Platelets 150 - 450 x10E3/uL 202 202 233.0    No results found for: VD25OH  Clinical ASCVD: No  The ASCVD Risk score (Arnett DK, et al., 2019) failed to calculate for the following reasons:   The 2019 ASCVD risk score is only valid for ages 64 to 54    Depression screen PHQ 2/9 05/30/2020 04/26/2019 03/21/2018  Decreased Interest 0 0 0  Down, Depressed, Hopeless 0 0 3  PHQ - 2 Score 0 0 3  Altered sleeping 0 0 0  Tired, decreased energy 0 0 3  Change in appetite 0 0 3  Feeling bad or failure about yourself  0 0 0  Trouble concentrating 0 0 0  Moving slowly or fidgety/restless 0 0 0  Suicidal thoughts 0 0 1   PHQ-9 Score 0 0 10  Difficult doing work/chores Not difficult at all Not difficult at all Not difficult at all   Social History   Tobacco Use  Smoking Status Never  Smokeless Tobacco Never   BP Readings from Last 3 Encounters:  10/22/20 (!) 131/56  09/12/20 108/64  08/08/20 110/60   Pulse Readings from Last 3 Encounters:  10/22/20 85  09/12/20 73  08/08/20 90   Wt Readings from Last 3 Encounters:  10/22/20 105 lb (47.6 kg)  09/12/20 107 lb (48.5 kg)  08/08/20 108 lb (49 kg)   BMI Readings from Last 3 Encounters:  10/22/20 20.51 kg/m  09/12/20 18.95 kg/m  08/08/20 19.13 kg/m    Assessment/Interventions: Review of patient past medical history, allergies, medications, health status, including review of consultants reports, laboratory and other test data, was performed as part of comprehensive evaluation and provision of chronic care management services.   SDOH:  (Social  Determinants of Health) assessments and interventions performed: Yes SDOH Interventions    Flowsheet Row Most Recent Value  SDOH Interventions   Financial Strain Interventions Intervention Not Indicated  Housing Interventions Intervention Not Indicated      SDOH Screenings   Alcohol Screen: Low Risk    Last Alcohol Screening Score (AUDIT): 0  Depression (PHQ2-9): Low Risk    PHQ-2 Score: 0  Financial Resource Strain: Low Risk    Difficulty of Paying Living Expenses: Not very hard  Food Insecurity: No Food Insecurity   Worried About Charity fundraiser in the Last Year: Never true   Ran Out of Food in the Last Year: Never true  Housing: Low Risk    Last Housing Risk Score: 0  Physical Activity: Inactive   Days of Exercise per Week: 0 days   Minutes of Exercise per Session: 0 min  Social Connections: Not on file  Stress: Stress Concern Present   Feeling of Stress : To some extent  Tobacco Use: Low Risk    Smoking Tobacco Use: Never   Smokeless Tobacco Use: Never   Passive Exposure: Not  on file  Transportation Needs: No Transportation Needs   Lack of Transportation (Medical): No   Lack of Transportation (Non-Medical): No    CCM Care Plan  Allergies  Allergen Reactions   Penicillins Hives and Rash    Has patient had a PCN reaction causing immediate rash, facial/tongue/throat swelling, SOB or lightheadedness with hypotension: Yes Has patient had a PCN reaction causing severe rash involving mucus membranes or skin necrosis: Yes Has patient had a PCN reaction that required hospitalization No Has patient had a PCN reaction occurring within the last 10 years: No If all of the above answers are "NO", then may proceed with Cephalosporin use.    Prednisone Swelling    Medications Reviewed Today     Reviewed by Debbora Dus, [facility] (Pharmacist) on 02/23/21 at 1117  Med List Status: <None>   Medication Order Taking? Sig Documenting Provider Last Dose Status Informant  acetaminophen (TYLENOL) 650 MG CR tablet [id number] Yes Take 1,300 mg by mouth every 8 (eight) hours as needed for pain. [provider] Taking Active Family Member  ADVAIR DISKUS 250-50 MCG/DOSE AEPB [id number] Yes INHALE 1 PUFF INTO THE LUNGS 2 TIMES DAILY.  Patient taking differently: Inhale 1 puff into the lungs 2 (two) times daily as needed (shortness of breath).   Pleas Koch, NP Taking Active Family Member  albuterol (PROVENTIL) (2.5 MG/3ML) 0.083% nebulizer solution [id number] Yes Take 3 mLs (2.5 mg total) by nebulization every 6 (six) hours as needed for wheezing or shortness of breath. Pleas Koch, NP Taking Active Family Member  alendronate (FOSAMAX) 70 MG tablet [id number] Yes TAKE 1 TABLET BY MOUTH ONCE A WEEK. TAKE WITH A FULL GLASS OF WATER ON AN EMPTY STOMACH. MONDAYS Pleas Koch, NP Taking Active Family Member           Med Note Caryn Section, [place] A   Thu Oct 16, 2020  2:11 PM) Mondays  Cholecalciferol (VITAMIN D) 2000 units tablet [id number] Yes Take 2,000 Units by mouth  daily. [provider] Taking Active Family Member  Cyanocobalamin (B-12) 5000 MCG CAPS [id number] Yes Take 5,000 mcg by mouth daily. [provider] Taking Active Family Member  docusate sodium (COLACE) 100 MG capsule [id number] Yes Take 100 mg by mouth daily. [provider] Taking Active Family Member  ferrous sulfate 325 (65 FE) MG tablet [id number] Yes Take  325 mg by mouth daily. [provider] Taking Active Family Member  HYDROcodone-acetaminophen (NORCO/VICODIN) 5-325 MG tablet [id number] Yes Take 1 tablet by mouth 2 (two) times daily as needed for moderate pain. Aundra Dubin, PA-C Taking Active   mirtazapine (REMERON) 15 MG tablet [id number] Yes TAKE 1/2 TO 1 TABLET BY MOUTH AT BEDTIME FOR SLEEP AND APPETITE. Pleas Koch, NP Taking Active   sertraline (ZOLOFT) 100 MG tablet [id number] Yes TAKE 1 TABLET BY MOUTH DAILY FOR ANXIETY AND DEPRESSION. Pleas Koch, NP Taking Active   SYNTHROID 100 MCG tablet [id number] Yes TAKE 1 TABLET BY MOUTH ON EMPTY STOMACH WITH WATER ONLY ON SUNDAY THROUGH THURSDAY, NO FOOD OR OTHER MEDICINE FOR 30 MINUTES. Pleas Koch, NP Taking Active   SYNTHROID 112 MCG tablet [id number] Yes TAKE 1 TABLET BY MOUTH EVERY MORNING ON EMPTY STOMACH WITH WATER ONLY ON FRIDAY AND SATURDAY, NO FOOD OR OTHER MEDICINES FOR 30 MINUTES. Pleas Koch, NP Taking Active             Patient Active Problem List   Diagnosis Date Noted   Anemia 12/11/2020   Acute pelvic pain 08/08/2020   Acute left-sided low back pain without sciatica 08/08/2020   Acute pain of right wrist 08/08/2020   Acute right-sided thoracic back pain 12/25/2019   Osteoporosis with current pathological fracture 03/27/2019   Decreased appetite 03/27/2019   Insomnia 09/11/2018   Urinary frequency 08/30/2018   Cough 05/01/2018   Urinary incontinence 03/21/2018   Irritation of external ear canal 09/28/2017   Compression fracture of body of thoracic  vertebra (HCC) 09/14/2017   Anxiety and depression 09/14/2017   Syncope and collapse 10/24/2012   Orthostatic hypotension 10/24/2012   Other fatigue 01/21/2011   Dizziness 01/21/2011   COPD (chronic obstructive pulmonary disease) ([place]) 01/21/2011   Hypothyroidism 03/28/2008   DYSPNEA 03/28/2008    Immunization History  Administered Date(s) Administered   Influenza,inj,Quad PF,6+ Mos 09/14/2017   Influenza-Unspecified 10/23/2012   PFIZER(Purple Top)SARS-COV-2 Vaccination 05/12/2019, 06/02/2019   Zoster, Live 06/12/2015    Conditions to be addressed/monitored:  COPD, Hypothyroidism, Depression, and Osteoporosis  Care Plan : [place]  Updates made by Debbora Dus, [place] since 02/23/2021 12:00 AM     Problem: CHL AMB "PATIENT-SPECIFIC PROBLEM"      Long-Range Goal: Disease Management   Start Date: 02/23/2021  Priority: High  Note:   Current Barriers:  None identified  Pharmacist Clinical Goal(s):  Patient will contact provider office for questions/concerns as evidenced notation of same in electronic health record through collaboration with PharmD and provider.   Interventions: 1:1 collaboration with Pleas Koch, NP regarding development and update of comprehensive plan of care as evidenced by provider attestation and co-signature Inter-disciplinary care team collaboration (see longitudinal plan of care) Comprehensive medication review performed; medication list updated in electronic medical record  COPD (Goal: control symptoms and prevent exacerbations) -Controlled, per patient report  -Pulmonology consulted 04/2019, no medication changed. Suspected asthma. Query cardiac cause of dyspnea. Referred to cardiology. Follow up PRN. -Current treatment  Advair Diskus 250-50 mcg/dose - Inhale 1 puff twice daily Albuterol nebulizer - Inhale every 6 hours PRN wheezing -Medications previously tried: none reported  -Pulmonary function testing: no formal PFTs  completed -Exacerbations requiring treatment in last 6 months: none -Patient denies consistent use of maintenance inhaler -Frequency of rescue inhaler use: Advair less than once weekly (depends on activity level), rare albuterol use -Counseled on When to use rescue inhaler;  -Recommended  to continue current medication  Depression/Anxiety (Goal: Improve mood/sleep) -Controlled, mood is stable per pt report. Daughter affirms mood and appetite good. Snacks throughout day. She has not weighed recently but does not appear to have lost weight per her report. Sleeping well. Wakes up 2-3 times to urinate. She has to sleep in recliner due to back pain. -Current treatment: Mirtazapine 15 mg - 1/2 to 1 tablet at bedtime (usually takes 1 whole) Sertraline 100 mg  - 1 tablet daily  -Medications previously tried/failed: none -PHQ9: 0 (05/30/20) -GAD7: 14 (03/21/18) -Recommended to continue current medication  Osteoporosis / Osteopenia (Goal Improve bone density, prevent fractures) -Controlled -No recent falls. She uses walker at all times. -Last DEXA Scan: April 2021   T-Score femoral neck: -3.0 -Patient is a candidate for pharmacologic treatment due to T-Score < -2.5 in femoral neck -Current treatment  Alendronate 70 mg - 1 tablet once weekly (Mondays) Vitamin D3 2000 IU daily  -Medications previously tried: none  -Counseled on oral bisphosphonate administration: take in the morning, 30 minutes prior to food with 6-8 oz of water. Do not lie down for at least 30 minutes after taking.  -Recommended to continue current medication  Hypothyroidism (Goal: TSH, T4 WNL) -Controlled -Current treatment  Synthroid 100 mcg - 1 tablet on Monday-Thursday Synthroid 112 mcg - 1 tablet on Friday and Saturday -Medications previously tried: none  -Recommended to continue current medication  Patient Goals/Self-Care Activities Patient will:  - take medications as prescribed as evidenced by patient report and  record review  Follow Up Plan: Telephone follow up appointment with care management team member scheduled for: - CCM PharmD 12 months follow up     Medication Assistance: None required.  Patient affirms current coverage meets needs.  Compliance/Adherence/Medication fill history: Care Gaps: None  Star-Rating Drugs: None  Patient's preferred pharmacy is:  [facility], [place] [address] Phone: [phone] Fax: [phone]  Uses pill box? No -    daughter puts them in a cup daily for patient Pt endorses 100% compliance She is doing well with current pharmacy and medication strategy.  Care Plan and Follow Up Patient Decision:  Patient agrees to Care Plan and Follow-up.  Debbora Dus, PharmD Clinical Pharmacist  [facility] [phone]

## 2021-02-18 ENCOUNTER — Telehealth: Payer: Self-pay

## 2021-02-18 NOTE — Progress Notes (Signed)
Chronic Care Management Pharmacy Assistant   Name: [name]  MRN: [id number] DOB: [birth year]  Reason for Encounter: CCM (Initial Questions)   Recent office visits:  12/11/2020 - Alma Friendly, NP - Video Visit - Patient presented for fatigue. Labs: CMP, Microscopic exam, Urinalysis, Brain Natriuretic peptide, CBC, A1c, Iron and TIBC, TSH and Urine Culture. Stop due to completed course: HYDROcodone-acetaminophen (NORCO) 7.5-325 MG tablet. Start: sulfamethoxazole-trimethoprim (BACTRIM DS) 800-160 MG tablet - Take 1 tablet by mouth 2 times daily.  09/12/2020 - Alma Friendly, NP - Patient presented for Polyuria and Dysuria. Labs: POCT Urinalysis and Urine Culture. Unable to provide urine specimen; sent home with patient.   Recent consult visits:  01/08/2021 - Frankey Shown, MD - Patient Message - Patient's daughter wrote in as patient fell and hurt her back again (broke her back in July and had cement injected in her back). Patient is wanting pain meds. Patient told daughter she was not going to have anything else done at 88 years old. Start: Hydrocodone-acetaminophen (NORCO/VICODIN) 5-325 MG tablet  09/30/2020 - Frankey Shown, MD - Orthopedic Surgery - Patient presented for lower back pain. Ordered: Referral to Interventional Radiology. Start: HYDROcodone-acetaminophen (NORCO) 7.5-325 MG tablet - 1-2 Tablets 2 times daily PRN.  09/20/2020 - Radiology - Patient presented for MR Lumbar Spine.  09/01/2020 - Eunice Blase, MD - Family Medicine - Patient presented for lower back pain. Ordered: MR Lumbar Spine. Change: HYDROcodone-acetaminophen (NORCO/VICODIN) 5-325 MG tablet - Take 1-2 tablets by mouth at bedtime as needed for moderate pain vs. 1 tablet every 4 hours PRN.   Hospital visits:  None in previous 6 months  Medications: Outpatient Encounter Medications as of 02/18/2021  Medication Sig Note   acetaminophen (TYLENOL) 650 MG CR tablet Take 1,300 mg by mouth every 8 (eight) hours as  needed for pain.    ADVAIR DISKUS 250-50 MCG/DOSE AEPB INHALE 1 PUFF INTO THE LUNGS 2 TIMES DAILY. (Patient taking differently: Inhale 1 puff into the lungs 2 (two) times daily as needed (shortness of breath).)    albuterol (PROVENTIL) (2.5 MG/3ML) 0.083% nebulizer solution Take 3 mLs (2.5 mg total) by nebulization every 6 (six) hours as needed for wheezing or shortness of breath.    alendronate (FOSAMAX) 70 MG tablet TAKE 1 TABLET BY MOUTH ONCE A WEEK. TAKE WITH A FULL GLASS OF WATER ON AN EMPTY STOMACH. MONDAYS 10/16/2020: Mondays   Cholecalciferol (VITAMIN D) 2000 units tablet Take 2,000 Units by mouth daily.    Cyanocobalamin (B-12) 5000 MCG CAPS Take 5,000 mcg by mouth daily.    docusate sodium (COLACE) 100 MG capsule Take 100 mg by mouth daily.    ferrous sulfate 325 (65 FE) MG tablet Take 325 mg by mouth daily.    HYDROcodone-acetaminophen (NORCO/VICODIN) 5-325 MG tablet Take 1 tablet by mouth 2 (two) times daily as needed for moderate pain.    mirtazapine (REMERON) 15 MG tablet TAKE 1/2 TO 1 TABLET BY MOUTH AT BEDTIME FOR SLEEP AND APPETITE.    sertraline (ZOLOFT) 100 MG tablet TAKE 1 TABLET BY MOUTH DAILY FOR ANXIETY AND DEPRESSION.    sulfamethoxazole-trimethoprim (BACTRIM DS) 800-160 MG tablet Take 1 tablet by mouth 2 (two) times daily. For urinary tract infection.    SYNTHROID 100 MCG tablet TAKE 1 TABLET BY MOUTH ON EMPTY STOMACH WITH WATER ONLY ON SUNDAY THROUGH THURSDAY, NO FOOD OR OTHER MEDICINE FOR 30 MINUTES.    SYNTHROID 112 MCG tablet TAKE 1 TABLET BY MOUTH EVERY MORNING ON EMPTY STOMACH  WITH WATER ONLY ON FRIDAY AND SATURDAY, NO FOOD OR OTHER MEDICINES FOR 30 MINUTES.    No facility-administered encounter medications on file as of 02/18/2021.   Lab Results  Component Value Date/Time   HGBA1C 5.2 12/11/2020 02:13 PM   HGBA1C 5.8 (H) 01/22/2011 12:13 AM    BP Readings from Last 3 Encounters:  10/22/20 (!) 131/56  09/12/20 108/64  08/08/20 110/60   Patient contacted to  review initial questions prior to visit with Debbora Dus.  Unsuccessful attempt to reach patient. Left patient message to have all medications, supplements and any blood glucose and blood pressure readings available for review at appointment.    Star Rating Drugs:  Medication:  Last Fill: Day Supply No star rating drugs noted  Care Gaps: Annual wellness visit in last year? Yes 05/30/2020 Most Recent BP reading: 131/56 on 10/22/2020  Debbora Dus, CPP notified  Marijean Niemann, [place] [phone]  Time Spent: 28 Minutes

## 2021-02-23 ENCOUNTER — Ambulatory Visit (INDEPENDENT_AMBULATORY_CARE_PROVIDER_SITE_OTHER): Payer: Medicare Other

## 2021-02-23 ENCOUNTER — Other Ambulatory Visit: Payer: Self-pay

## 2021-02-23 DIAGNOSIS — F419 Anxiety disorder, unspecified: Secondary | ICD-10-CM

## 2021-02-23 DIAGNOSIS — J439 Emphysema, unspecified: Secondary | ICD-10-CM

## 2021-02-23 DIAGNOSIS — M8000XS Age-related osteoporosis with current pathological fracture, unspecified site, sequela: Secondary | ICD-10-CM

## 2021-02-23 DIAGNOSIS — E039 Hypothyroidism, unspecified: Secondary | ICD-10-CM

## 2021-02-23 NOTE — Patient Instructions (Signed)
February 23, 2021  Dear [name],  It was a pleasure meeting you during our initial appointment on February 23, 2021. Below is a summary of the goals we discussed and components of chronic care management. Please contact me anytime with questions or concerns.   Visit Information  Patient Care Plan: CCM Pharmacy Care Plan     Problem Identified: CHL AMB "PATIENT-SPECIFIC PROBLEM"      Long-Range Goal: Disease Management   Start Date: 02/23/2021  Priority: High  Note:   Current Barriers:  None identified  Pharmacist Clinical Goal(s):  Patient will contact provider office for questions/concerns as evidenced notation of same in electronic health record through collaboration with PharmD and provider.   Interventions: 1:1 collaboration with Pleas Koch, NP regarding development and update of comprehensive plan of care as evidenced by provider attestation and co-signature Inter-disciplinary care team collaboration (see longitudinal plan of care) Comprehensive medication review performed; medication list updated in electronic medical record  COPD (Goal: control symptoms and prevent exacerbations) -Controlled, per patient report  -Pulmonology consulted 04/2019, no medication changed. Suspected asthma. Query cardiac cause of dyspnea. Referred to cardiology. Follow up PRN. -Current treatment  Advair Diskus 250-50 mcg/dose - Inhale 1 puff twice daily Albuterol nebulizer - Inhale every 6 hours PRN wheezing -Medications previously tried: none reported  -Pulmonary function testing: no formal PFTs completed -Exacerbations requiring treatment in last 6 months: none -Patient denies consistent use of maintenance inhaler -Frequency of rescue inhaler use: Advair less than once weekly (depends on activity level), rare albuterol use -Counseled on When to use rescue inhaler;  -Recommended to continue current medication  Depression/Anxiety (Goal: Improve mood/sleep) -Controlled, mood is  stable per pt report. Daughter affirms mood and appetite good. Snacks throughout day. She has not weighed recently but does not appear to have lost weight per her report. Sleeping well. Wakes up 2-3 times to urinate. She has to sleep in recliner due to back pain. -Current treatment: Mirtazapine 15 mg - 1/2 to 1 tablet at bedtime (usually takes 1 whole) Sertraline 100 mg  - 1 tablet daily  -Medications previously tried/failed: none -PHQ9: 0 (05/30/20) -GAD7: 14 (03/21/18) -Recommended to continue current medication  Osteoporosis / Osteopenia (Goal Improve bone density, prevent fractures) -Controlled -No recent falls. She uses walker at all times. -Last DEXA Scan: April 2021   T-Score femoral neck: -3.0 -Patient is a candidate for pharmacologic treatment due to T-Score < -2.5 in femoral neck -Current treatment  Alendronate 70 mg - 1 tablet once weekly (Mondays) Vitamin D3 2000 IU daily  -Medications previously tried: none  -Counseled on oral bisphosphonate administration: take in the morning, 30 minutes prior to food with 6-8 oz of water. Do not lie down for at least 30 minutes after taking.  -Recommended to continue current medication  Hypothyroidism (Goal: TSH, T4 WNL) -Controlled -Current treatment  Synthroid 100 mcg - 1 tablet on Monday-Thursday Synthroid 112 mcg - 1 tablet on Friday and Saturday -Medications previously tried: none  -Recommended to continue current medication  Patient Goals/Self-Care Activities Patient will:  - take medications as prescribed as evidenced by patient report and record review  Follow Up Plan: Telephone follow up appointment with care management team member scheduled for: - CCM PharmD 12 months follow up     [name] was given information about Chronic Care Management services today including:  CCM service includes personalized support from designated clinical staff supervised by her physician, including individualized plan of care and  coordination with other  care providers 24/7 contact phone numbers for assistance for urgent and routine care needs. Standard insurance, coinsurance, copays and deductibles apply for chronic care management only during months in which we provide at least 20 minutes of these services. Most insurances cover these services at 100%, however patients may be responsible for any copay, coinsurance and/or deductible if applicable. This service may help you avoid the need for more expensive face-to-face services. Only one practitioner may furnish and bill the service in a calendar month. The patient may stop CCM services at any time (effective at the end of the month) by phone call to the office staff.  Patient agreed to services and verbal consent obtained.   Patient verbalizes understanding of instructions and care plan provided today and agrees to view in [place]. Active MyChart status confirmed with patient.    Debbora Dus, PharmD Clinical Pharmacist Practitioner [facility] [phone]

## 2021-02-26 ENCOUNTER — Telehealth: Payer: Self-pay | Admitting: Primary Care

## 2021-02-26 NOTE — Telephone Encounter (Signed)
Pt daughter called asking if she can come pick up a UTI kit for pt because pt is not able to come. Please advise.

## 2021-02-26 NOTE — Telephone Encounter (Signed)
Patient needs to be seen right?

## 2021-02-26 NOTE — Telephone Encounter (Signed)
Yes, she can pick up a kit and we can set up a virtual visit. °She will need a visit before I can prescribe anything if her urine is positive. °

## 2021-02-27 NOTE — Telephone Encounter (Signed)
LMTCB to schedule a vv

## 2021-03-09 ENCOUNTER — Other Ambulatory Visit: Payer: Self-pay | Admitting: Primary Care

## 2021-03-09 DIAGNOSIS — M8000XS Age-related osteoporosis with current pathological fracture, unspecified site, sequela: Secondary | ICD-10-CM

## 2021-03-10 DIAGNOSIS — F32A Depression, unspecified: Secondary | ICD-10-CM | POA: Diagnosis not present

## 2021-03-10 DIAGNOSIS — E039 Hypothyroidism, unspecified: Secondary | ICD-10-CM

## 2021-03-10 DIAGNOSIS — J449 Chronic obstructive pulmonary disease, unspecified: Secondary | ICD-10-CM

## 2021-03-10 DIAGNOSIS — M810 Age-related osteoporosis without current pathological fracture: Secondary | ICD-10-CM

## 2021-03-10 DIAGNOSIS — M8580 Other specified disorders of bone density and structure, unspecified site: Secondary | ICD-10-CM

## 2021-03-30 ENCOUNTER — Other Ambulatory Visit: Payer: Self-pay | Admitting: Physician Assistant

## 2021-04-03 ENCOUNTER — Encounter: Payer: Self-pay | Admitting: Orthopaedic Surgery

## 2021-04-06 ENCOUNTER — Other Ambulatory Visit: Payer: Self-pay | Admitting: Physician Assistant

## 2021-04-06 MED ORDER — HYDROCODONE-ACETAMINOPHEN 5-325 MG PO TABS: 1.0000 | ORAL_TABLET | Freq: Two times a day (BID) | ORAL | 0 refills | Status: DC | PRN

## 2021-04-06 NOTE — Telephone Encounter (Signed)
I refilled, but could you let her know that we need to refer to pain clinic or have pcp write pain meds as we cannot write chronic pain meds?

## 2021-05-22 ENCOUNTER — Other Ambulatory Visit: Payer: Self-pay | Admitting: Primary Care

## 2021-05-22 DIAGNOSIS — J439 Emphysema, unspecified: Secondary | ICD-10-CM

## 2021-05-26 ENCOUNTER — Encounter: Payer: Self-pay | Admitting: Medical

## 2021-05-26 ENCOUNTER — Ambulatory Visit (INDEPENDENT_AMBULATORY_CARE_PROVIDER_SITE_OTHER): Payer: Medicare Other | Admitting: Medical

## 2021-05-26 VITALS — BP 114/66 | HR 77 | Ht 60.0 in | Wt 99.0 lb

## 2021-05-26 DIAGNOSIS — R0609 Other forms of dyspnea: Secondary | ICD-10-CM | POA: Diagnosis not present

## 2021-05-26 NOTE — Progress Notes (Signed)
Cardiology Office Note:    Date:  05/26/2021   ID:  [name], DOB [birth year], MRN [id number]  PCP:  Pleas Koch, NP  [facility] Cardiologist:  Nelva Bush, MD  [facility] Electrophysiologist:  None   Referring MD: Pleas Koch, NP   Chief Complaint: 12 month follow-up  History of Present Illness:    [name] is a 88 y.o. female with a hx of with history of possible COPD, syncope, hypothyroidism, vitamin B12 deficiency, diverticulitis, arthritis, and anxiety who presents for follow-up of dyspnea.    She was previously followed in our practice by Dr. Stanford Breed though had been lost to follow-up from 2010 until she was seen most recently in 04/2019 at the request of pulmonology for evaluation of chest pressure and exertional dyspnea.  Echo as obtained by pulmonology in 04/2019 demonstrated an EF of 55 to 60%, no regional wall motion abnormalities, grade 1 diastolic dysfunction, normal RV systolic function and RV cavity size, trivial aortic valve insufficiency, mild to moderate aortic valve sclerosis without stenosis.  At her visit with Dr. Saunders Revel in 04/2019 she reported a several month history of intermittent chest pressure and shortness of breath.  She noted the symptoms seem to have improved some after she began eating more, and putting on more weight.  Both the patient and daughter attributed this to mirtazapine.  She noted her chest pressure would happen randomly though had not had any in several weeks prior to her visit at that time.  She noted her dyspnea was often with activity or even while talking on the phone.  Lastly, she noted some positional dizziness.  Given recent improvement in symptoms, advanced aged, frail state, occasional falls, and in the setting of her recent echo demonstrating preserved LVSF additional testing was deferred at that time.  Last seen 07/2019 and was doing well from a cardiac perspective.   Today, the patient reports she has chronic  back pain from a fall in July. Back limits her function. She uses a walker. No chest pain or shortness of breath. She has LLE. It improves at night. She sleeps in a recliner. She has occasional dizzy spells. DO not happen often. No orthopnea, pnd or palpitations.   Past Medical History:  Diagnosis Date   Anxiety    Arthritis    "hands" (10/24/2012)   B12 deficiency    "get shots twice/month" (10/24/2012)   Bursitis    Depression    Diverticulitis    Exertional shortness of breath    Headache(784.0)    "weekly at least" (10/24/2012)   Hypothyroidism    Insomnia    Ovarian cancer ([place])    mass hooked to sm. intestine/bladder   Skin cancer of face    Syncope    when neck is up or turned to side   Syncope and collapse    "first time I ever I collapsed"; confirms h/o presyncopal events (10/24/2012)   UTI (lower urinary tract infection)     Past Surgical History:  Procedure Laterality Date   ABDOMINAL HYSTERECTOMY     CATARACT EXTRACTION W/ INTRAOCULAR LENS  IMPLANT, BILATERAL Bilateral 2000's   DILATION AND CURETTAGE OF UTERUS     several   FOOT SURGERY Right 1943-1944   4 surgeries   IR KYPHO LUMBAR INC FX REDUCE BONE BX UNI/BIL CANNULATION INC/IMAGING  10/22/2020   IR KYPHO THORACIC WITH BONE BIOPSY  07/27/2017   THYROIDECTOMY     TONSILLECTOMY      Current  Medications: Current Meds  Medication Sig   acetaminophen (TYLENOL) 650 MG CR tablet Take 1,300 mg by mouth every 8 (eight) hours as needed for pain.   ADVAIR DISKUS 250-50 MCG/ACT AEPB INHALE 1 PUFF INTO THE LUNGS 2 TIMES DAILY.   albuterol (PROVENTIL) (2.5 MG/3ML) 0.083% nebulizer solution Take 3 mLs (2.5 mg total) by nebulization every 6 (six) hours as needed for wheezing or shortness of breath.   alendronate (FOSAMAX) 70 MG tablet TAKE 1 TABLET BY MOUTH ONCE A WEEK. TAKE WITH A FULL GLASS OF WATER ON AN EMPTY STOMACH. MONDAYS   Cholecalciferol (VITAMIN D) 2000 units tablet Take 2,000 Units by mouth daily.    Cyanocobalamin (B-12) 5000 MCG CAPS Take 5,000 mcg by mouth daily.   docusate sodium (COLACE) 100 MG capsule Take 100 mg by mouth daily.   ferrous sulfate 325 (65 FE) MG tablet Take 325 mg by mouth daily.   HYDROcodone-acetaminophen (NORCO/VICODIN) 5-325 MG tablet Take 1 tablet by mouth 2 (two) times daily as needed for moderate pain.   mirtazapine (REMERON) 15 MG tablet TAKE 1/2 TO 1 TABLET BY MOUTH AT BEDTIME FOR SLEEP AND APPETITE.   sertraline (ZOLOFT) 100 MG tablet TAKE 1 TABLET BY MOUTH DAILY FOR ANXIETY AND DEPRESSION.   SYNTHROID 100 MCG tablet TAKE 1 TABLET BY MOUTH ON EMPTY STOMACH WITH WATER ONLY ON SUNDAY THROUGH THURSDAY, NO FOOD OR OTHER MEDICINE FOR 30 MINUTES.   SYNTHROID 112 MCG tablet TAKE 1 TABLET BY MOUTH EVERY MORNING ON EMPTY STOMACH WITH WATER ONLY ON FRIDAY AND SATURDAY, NO FOOD OR OTHER MEDICINES FOR 30 MINUTES.     Allergies:   Penicillins and Prednisone   Social History   Socioeconomic History   Marital status: Widowed    Spouse name: Not on file   Number of children: Not on file   Years of education: Not on file   Highest education level: Not on file  Occupational History   Not on file  Tobacco Use   Smoking status: Never   Smokeless tobacco: Never  Vaping Use   Vaping Use: Never used  Substance and Sexual Activity   Alcohol use: No   Drug use: No   Sexual activity: Never  Other Topics Concern   Not on file  Social History Narrative   Not on file   Social Determinants of Health   Financial Resource Strain: Low Risk    Difficulty of Paying Living Expenses: Not very hard  Food Insecurity: No Food Insecurity   Worried About Running Out of Food in the Last Year: Never true   [place] in the Last Year: Never true  Transportation Needs: No Transportation Needs   Lack of Transportation (Medical): No   Lack of Transportation (Non-Medical): No  Physical Activity: Inactive   Days of Exercise per Week: 0 days   Minutes of Exercise per Session: 0  min  Stress: Stress Concern Present   Feeling of Stress : To some extent  Social Connections: Not on file     Family History: The patient's family history includes Breast cancer in her mother; Heart Problems in her father; Heart attack in her father.  ROS:   Please see the history of present illness.     All other systems reviewed and are negative.  EKGs/Labs/Other Studies Reviewed:    The following studies were reviewed today:  2D echo 04/2019: 1. Left ventricular ejection fraction, by estimation, is 55 to 60%. The  left ventricle has normal function. The  left ventricle has no regional  wall motion abnormalities. Left ventricular diastolic parameters are  consistent with Grade I diastolic  dysfunction (impaired relaxation).   2. Right ventricular systolic function is normal. The right ventricular  size is normal. Tricuspid regurgitation signal is inadequate for assessing  PA pressure.   3. The mitral valve is normal in structure. No evidence of mitral valve  regurgitation. No evidence of mitral stenosis.   4. The aortic valve is normal in structure. Aortic valve regurgitation is  trivial. Mild to moderate aortic valve sclerosis/calcification is present,  without any evidence of aortic stenosis.   5. The inferior vena cava is normal in size with <50% respiratory  variability, suggesting right atrial pressure of 8 mmHg.  EKG:  EKG is  ordered today.  The ekg ordered today demonstrates NSR, 77bpm, Qtc 836m, nonspecific ST/T wave changes  Recent Labs: 12/11/2020: ALT 7; BNP 69.0; BUN 17; Creatinine, Ser 0.83; Hemoglobin 12.0; Platelets 202; Potassium 3.9; Sodium 142; TSH 2.940  Recent Lipid Panel    Component Value Date/Time   CHOL 188 03/27/2019 1616   TRIG 50.0 03/27/2019 1616   HDL 57.30 03/27/2019 1616   CHOLHDL 3 03/27/2019 1616   VLDL 10.0 03/27/2019 1616   LDLCALC 121 (H) 03/27/2019 1616     Physical Exam:    VS:  BP 114/66 (BP Location: Left Arm, Patient  Position: Sitting, Cuff Size: Normal)   Pulse 77   Ht 5' (1.524 m)   Wt 99 lb (44.9 kg)   SpO2 98%   BMI 19.33 kg/m     Wt Readings from Last 3 Encounters:  05/26/21 99 lb (44.9 kg)  10/22/20 105 lb (47.6 kg)  09/12/20 107 lb (48.5 kg)     GEN:  Well nourished, well developed in no acute distress HEENT: Normal NECK: No JVD; No carotid bruits LYMPHATICS: No lymphadenopathy CARDIAC: RRR, no murmurs, rubs, gallops RESPIRATORY:  Clear to auscultation without rales, wheezing or rhonchi  ABDOMEN: Soft, non-tender, non-distended MUSCULOSKELETAL:  No edema; No deformity  SKIN: Warm and dry NEUROLOGIC:  Alert and oriented x 3 PSYCHIATRIC:  Normal affect   ASSESSMENT:    1. Dyspnea on exertion    PLAN:    In order of problems listed above:  Dyspnea on exertion Patient denies significant dyspnea on exertion or chest pain. Function is greatly limited by back pain. Overall she is not very active. She has dependent lower leg edema at times. BP is good. EKG shows SR prolonged Qtc 4891m Recommend caution with Qt pronging medications. She had labs earlier this year that looked good. Prior echo in 2021 showed normal LVEF with G1DD, mild to moderate aortic valve sclerosis/calcification. She is not on any diuretics at baseline. No further work-up planned.    Disposition: Follow up in 1 year(s) with MD/APP      Signed, Peighton Mehra H Ninfa MeekerPA-C  05/26/2021 3:16 PM    [facility]

## 2021-05-26 NOTE — Patient Instructions (Signed)
Medication Instructions:  ? ?Your physician recommends that you continue on your current medications as directed. Please refer to the Current Medication list given to you today. ? ?*If you need a refill on your cardiac medications before your next appointment, please call your pharmacy* ? ? ?Lab Work: ? ?None ordered ? ?Testing/Procedures: ? ?None ordered ? ? ?Follow-Up: ?At [facility], you and your health needs are our priority.  As part of our continuing mission to provide you with exceptional heart care, we have created designated Provider Care Teams.  These Care Teams include your primary Cardiologist (physician) and Advanced Practice Providers (APPs -  Physician Assistants and Nurse Practitioners) who all work together to provide you with the care you need, when you need it. ? ?We recommend signing up for the patient portal called "MyChart".  Sign up information is provided on this After Visit Summary.  MyChart is used to connect with patients for Virtual Visits (Telemedicine).  Patients are able to view lab/test results, encounter notes, upcoming appointments, etc.  Non-urgent messages can be sent to your provider as well.   ?To learn more about what you can do with MyChart, go to [email or web address].   ? ?Your next appointment:   ?1 year(s) ? ?The format for your next appointment:   ?In Person ? ?Provider:   ?You may see Christopher End, MD or one of the following Advanced Practice Providers on your designated Care Team:   ?Christopher Berge, NP ?Ryan Dunn, PA-C ?Cadence Furth, PA-C ? ?Important Information About Sugar ? ? ? ? ? ? ?

## 2021-06-01 ENCOUNTER — Other Ambulatory Visit: Payer: Self-pay | Admitting: Primary Care

## 2021-06-01 DIAGNOSIS — M8000XS Age-related osteoporosis with current pathological fracture, unspecified site, sequela: Secondary | ICD-10-CM

## 2021-06-01 DIAGNOSIS — E2839 Other primary ovarian failure: Secondary | ICD-10-CM

## 2021-06-01 NOTE — Telephone Encounter (Signed)
Patient needs repeat bone density scan in order to continue receiving her alendronate (Fosamax) bone density medication.  Also, I would like to see her in the office sometime this summer for follow up. Sometime in July is fine. Please schedule.   Where do they want to go for her bone density scan?

## 2021-06-01 NOTE — Telephone Encounter (Signed)
Noted, order for bone density scan placed. Make sure they know that they have to call to schedule. I sent order to [facility].

## 2021-06-01 NOTE — Telephone Encounter (Signed)
Called patient reviewed all information and repeated back to me. Will call if any questions.   They would like to have dexa  in [place] I have made f/u in office for July

## 2021-06-03 ENCOUNTER — Telehealth: Payer: Self-pay

## 2021-06-03 ENCOUNTER — Ambulatory Visit (INDEPENDENT_AMBULATORY_CARE_PROVIDER_SITE_OTHER): Payer: Medicare Other

## 2021-06-03 VITALS — Ht 60.0 in | Wt 90.0 lb

## 2021-06-03 DIAGNOSIS — Z0000 Encounter for general adult medical examination without abnormal findings: Secondary | ICD-10-CM | POA: Diagnosis not present

## 2021-06-03 DIAGNOSIS — Z5941 Food insecurity: Secondary | ICD-10-CM

## 2021-06-03 DIAGNOSIS — Z599 Problem related to housing and economic circumstances, unspecified: Secondary | ICD-10-CM | POA: Diagnosis not present

## 2021-06-03 NOTE — Progress Notes (Signed)
Subjective:   [name] is a 88 y.o. female who presents for Medicare Annual (Subsequent) preventive examination. Virtual Visit via Telephone Note  I connected with  [name] on 06/03/21 at 12:00 PM EDT by telephone and verified that I am speaking with the correct person using two identifiers.  Location: Patient: HOME Provider: [facility] Persons participating in the virtual visit: patient/Nurse Health Advisor   I discussed the limitations, risks, security and privacy concerns of performing an evaluation and management service by telephone and the availability of in person appointments. The patient expressed understanding and agreed to proceed.  Interactive audio and video telecommunications were attempted between this nurse and patient, however failed, due to patient having technical difficulties OR patient did not have access to video capability.  We continued and completed visit with audio only.  Some vital signs may be absent or patient reported.   Donna Driver, LPN  Review of Systems     Cardiac Risk Factors include: advanced age (>55mn, >>31women);sedentary lifestyle;Other (see comment), Risk factor comments: Hypotension, Compression fx, Osteoporosis.     Objective:    Today's Vitals   06/03/21 1209 06/03/21 1211  Weight: 90 lb (40.8 kg)   Height: 5' (1.524 m)   PainSc:  5    Body mass index is 17.58 kg/m.     06/03/2021   12:22 PM 10/22/2020   10:36 AM 08/03/2020    4:16 PM 05/30/2020    2:51 PM 04/26/2019    3:21 PM 09/06/2018    4:17 PM 03/21/2018    2:46 PM  Advanced Directives  Does Patient Have a Medical Advance Directive? Yes Yes Yes Yes Yes No Yes  Type of [place] will Living will [place] will [place] will  [place]an YgnacioLiving will  Does patient want to make changes to medical advance  directive?  No - Patient declined       Copy of [place] Chart? No - copy requested No - copy requested  No - copy requested No - copy requested  No - copy requested  Would patient like information on creating a medical advance directive?      No - Patient declined     Current Medications (verified) Outpatient Encounter Medications as of 06/03/2021  Medication Sig   acetaminophen (TYLENOL) 650 MG CR tablet Take 1,300 mg by mouth every 8 (eight) hours as needed for pain.   ADVAIR DISKUS 250-50 MCG/ACT AEPB INHALE 1 PUFF INTO THE LUNGS 2 TIMES DAILY.   albuterol (PROVENTIL) (2.5 MG/3ML) 0.083% nebulizer solution Take 3 mLs (2.5 mg total) by nebulization every 6 (six) hours as needed for wheezing or shortness of breath.   alendronate (FOSAMAX) 70 MG tablet TAKE 1 TABLET BY MOUTH ONCE A WEEK (ON MONDAYS). TAKE WITH A FULL GLASS OF WATER ON AN EMPTY STOMACH.   Cholecalciferol (VITAMIN D) 2000 units tablet Take 2,000 Units by mouth daily.   Cyanocobalamin (B-12) 5000 MCG CAPS Take 5,000 mcg by mouth daily.   docusate sodium (COLACE) 100 MG capsule Take 100 mg by mouth daily.   ferrous sulfate 325 (65 FE) MG tablet Take 325 mg by mouth daily.   HYDROcodone-acetaminophen (NORCO/VICODIN) 5-325 MG tablet Take 1 tablet by mouth 2 (two) times daily as needed for moderate pain.   mirtazapine (REMERON) 15 MG tablet TAKE 1/2 TO 1 TABLET BY MOUTH AT BEDTIME  FOR SLEEP AND APPETITE.   sertraline (ZOLOFT) 100 MG tablet TAKE 1 TABLET BY MOUTH DAILY FOR ANXIETY AND DEPRESSION.   SYNTHROID 100 MCG tablet TAKE 1 TABLET BY MOUTH ON EMPTY STOMACH WITH WATER ONLY ON Donna THROUGH THURSDAY, NO FOOD OR OTHER MEDICINE FOR 30 MINUTES.   SYNTHROID 112 MCG tablet TAKE 1 TABLET BY MOUTH EVERY MORNING ON EMPTY STOMACH WITH WATER ONLY ON FRIDAY AND SATURDAY, NO FOOD OR OTHER MEDICINES FOR 30 MINUTES.   No facility-administered encounter medications on file as of 06/03/2021.    Allergies  (verified) Penicillins and Prednisone   History: Past Medical History:  Diagnosis Date   Anxiety    Arthritis    "hands" (10/24/2012)   B12 deficiency    "get shots twice/month" (10/24/2012)   Bursitis    Depression    Diverticulitis    Exertional shortness of breath    Headache(784.0)    "weekly at least" (10/24/2012)   Hypothyroidism    Insomnia    Ovarian cancer ([place])    mass hooked to sm. intestine/bladder   Skin cancer of face    Syncope    when neck is up or turned to side   Syncope and collapse    "first time I ever I collapsed"; confirms h/o presyncopal events (10/24/2012)   UTI (lower urinary tract infection)    Past Surgical History:  Procedure Laterality Date   ABDOMINAL HYSTERECTOMY     CATARACT EXTRACTION W/ INTRAOCULAR LENS  IMPLANT, BILATERAL Bilateral 2000's   DILATION AND CURETTAGE OF UTERUS     several   FOOT SURGERY Right 1943-1944   4 surgeries   IR KYPHO LUMBAR INC FX REDUCE BONE BX UNI/BIL CANNULATION INC/IMAGING  10/22/2020   IR KYPHO THORACIC WITH BONE BIOPSY  07/27/2017   THYROIDECTOMY     TONSILLECTOMY     Family History  Problem Relation Age of Onset   Breast cancer Mother    Heart Problems Father    Heart attack Father    Social History   Socioeconomic History   Marital status: Widowed    Spouse name: Not on file   Number of children: Not on file   Years of education: Not on file   Highest education level: Not on file  Occupational History   Not on file  Tobacco Use   Smoking status: Never   Smokeless tobacco: Never  Vaping Use   Vaping Use: Never used  Substance and Sexual Activity   Alcohol use: No   Drug use: No   Sexual activity: Never  Other Topics Concern   Not on file  Social History Narrative   Not on file   Social Determinants of Health   Financial Resource Strain: High Risk   Difficulty of Paying Living Expenses: Hard  Food Insecurity: Food Insecurity Present   Worried About Running Out of Food in the  Last Year: Often true   Ran Out of Food in the Last Year: Often true  Transportation Needs: No Transportation Needs   Lack of Transportation (Medical): No   Lack of Transportation (Non-Medical): No  Physical Activity: Insufficiently Active   Days of Exercise per Week: 3 days   Minutes of Exercise per Session: 10 min  Stress: Stress Concern Present   Feeling of Stress : To some extent  Social Connections: Moderately Isolated   Frequency of Communication with Friends and Family: More than three times a week   Frequency of Social Gatherings with Friends and Family: More than three  times a week   Attends Religious Services: 1 to 4 times per year   Active Member of Clubs or Organizations: No   Attends Archivist Meetings: Never   Marital Status: Widowed    Tobacco Counseling Counseling given: Not Answered   Clinical Intake:  Pre-visit preparation completed: Yes  Pain : 0-10 Pain Score: 5  Pain Type: Chronic pain Pain Location: Back Pain Descriptors / Indicators: Aching Pain Onset: More than a month ago Pain Frequency: Intermittent     BMI - recorded: 17.58 Nutritional Status: BMI <19  Underweight Nutritional Risks: None Diabetes: No  How often do you need to have someone help you when you read instructions, pamphlets, or other written materials from your doctor or pharmacy?: 1 - Never  Diabetic?NO  Interpreter Needed?: No  Information entered by :: mj Onnie Hatchel, lpn   Activities of Daily Living    06/03/2021   12:24 PM  In your present state of health, do you have any difficulty performing the following activities:  Hearing? 1  Vision? 0  Difficulty concentrating or making decisions? 0  Walking or climbing stairs? 1  Dressing or bathing? 0  Doing errands, shopping? 1  Preparing Food and eating ? Y  Using the Toilet? N  In the past six months, have you accidently leaked urine? Y  Do you have problems with loss of bowel control? N  Managing your  Medications? Y  Managing your Finances? Y  Housekeeping or managing your Housekeeping? Y    Patient Care Team: Pleas Koch, NP as PCP - General (Internal Medicine) End, Harrell Gave, MD as PCP - Cardiology (Cardiology) Debbora Dus, [facility] as Pharmacist (Pharmacist)  Indicate any recent Medical Services you may have received from other than [facility] providers in the past year (date may be approximate).     Assessment:   This is a routine wellness examination for Genever.  Hearing/Vision screen Hearing Screening - Comments:: Some hearing issues.  Vision Screening - Comments:: Glasses. [facility] in [facility] 20[facility].  Dietary issues and exercise activities discussed: Current Exercise Habits: Home exercise routine, Type of exercise: walking, Time (Minutes): 10, Frequency (Times/Week): 5, Weekly Exercise (Minutes/Week): 50, Intensity: Mild, Exercise limited by: cardiac condition(s);orthopedic condition(s)   Goals Addressed             This Visit's Progress    Have 3 meals a day       Continue to eat healthy.     Patient Stated   On track    Starting 03/21/18, I will continue to take medications as prescribed.       Patient Stated   On track    04/26/2019, I will maintain and continue medications as prescribed.      Patient Stated   On track    05/30/2020, I will maintain and continue medications as prescribed.       Depression Screen    06/03/2021   12:15 PM 05/30/2020    3:02 PM 04/26/2019    3:23 PM 03/21/2018    2:39 PM  PHQ 2/9 Scores  PHQ - 2 Score 1 0 0 3  PHQ- 9 Score  0 0 10    Fall Risk    06/03/2021   12:23 PM 05/30/2020    2:53 PM 04/26/2019    3:22 PM 03/21/2018    2:39 PM  Fall Risk   Falls in the past year? '1 1 1 1  '$ Number falls in past yr: 0 '1 1 1  '$ Injury  with Fall? 1 0 0 1  Risk for fall due to : Impaired balance/gait;Impaired mobility;History of fall(s) Medication side effect Medication side effect;History of fall(s) Impaired  balance/gait;Impaired mobility;History of fall(s)  Follow up Falls prevention discussed Falls evaluation completed;Falls prevention discussed Falls evaluation completed;Falls prevention discussed     FALL RISK PREVENTION PERTAINING TO THE HOME:  Any stairs in or around the home? Yes  If so, are there any without handrails? No  Home free of loose throw rugs in walkways, pet beds, electrical cords, etc? Yes  Adequate lighting in your home to reduce risk of falls? Yes   ASSISTIVE DEVICES UTILIZED TO PREVENT FALLS:  Life alert? No  Use of a cane, walker or w/c? Yes  Grab bars in the bathroom? Yes  Shower chair or bench in shower? Yes  Elevated toilet seat or a handicapped toilet? Yes   TIMED UP AND GO:  Was the test performed? No .  Phone visit.  Cognitive Function:    05/30/2020    3:03 PM 04/26/2019    3:25 PM 03/21/2018    3:10 PM  MMSE - Mini Mental State Exam  Orientation to time '5 5 3  '$ Orientation to time comments   disoriented to year despite cues given  Orientation to Place '5 5 5  '$ Registration '3 3 3  '$ Attention/ Calculation 5 5 0  Recall '3 3 3  '$ Language- name 2 objects   0  Language- repeat '1 1 1  '$ Language- follow 3 step command   2  Language- follow 3 step command-comments   unable to follow 1 step of 3 step command despite cues given  Language- read & follow direction   0  Write a sentence   0  Copy design   0  Total score   17        06/03/2021   12:25 PM  6CIT Screen  What Year? 0 points  What month? 0 points  What time? 0 points  Count back from 20 0 points  Months in reverse 4 points  Repeat phrase 0 points  Total Score 4 points    Immunizations Immunization History  Administered Date(s) Administered   Influenza,inj,Quad PF,6+ Mos 09/14/2017   Influenza-Unspecified 10/23/2012   PFIZER(Purple Top)SARS-COV-2 Vaccination 05/12/2019, 06/02/2019   Zoster, Live 06/12/2015    TDAP status: Due, Education has been provided regarding the importance of  this vaccine. Advised may receive this vaccine at local pharmacy or Health Dept. Aware to provide a copy of the vaccination record if obtained from local pharmacy or Health Dept. Verbalized acceptance and understanding.  Flu Vaccine status: Declined, Education has been provided regarding the importance of this vaccine but patient still declined. Advised may receive this vaccine at local pharmacy or Health Dept. Aware to provide a copy of the vaccination record if obtained from local pharmacy or Health Dept. Verbalized acceptance and understanding.  Pneumococcal vaccine status: Declined,  Education has been provided regarding the importance of this vaccine but patient still declined. Advised may receive this vaccine at local pharmacy or Health Dept. Aware to provide a copy of the vaccination record if obtained from local pharmacy or Health Dept. Verbalized acceptance and understanding.   Covid-19 vaccine status: Completed vaccines  Qualifies for Shingles Vaccine? Yes   Zostavax completed No   Shingrix Completed?: No.    Education has been provided regarding the importance of this vaccine. Patient has been advised to call insurance company to determine out of pocket expense if they have  not yet received this vaccine. Advised may also receive vaccine at local pharmacy or Health Dept. Verbalized acceptance and understanding.  Screening Tests Health Maintenance  Topic Date Due   COVID-19 Vaccine (3 - Booster for Pfizer series) 06/19/2021 (Originally 07/28/2019)   Zoster Vaccines- Shingrix (1 of 2) 09/03/2021 (Originally 08/04/1980)   TETANUS/TDAP  05/31/2022 (Originally 08/04/1947)   Pneumonia Vaccine 23+ Years old (1 - PCV) 06/04/2022 (Originally 08/04/1934)   INFLUENZA VACCINE  08/11/2021   DEXA SCAN  Completed   HPV VACCINES  Aged Out    Health Maintenance  There are no preventive care reminders to display for this patient.   Colorectal cancer screening: No longer required.   Mammogram  status: No longer required due to age.  Bone Density status: Completed 09/14/2017. Results reflect: Bone density results: OSTEOPOROSIS. Repeat every 2 years.  Lung Cancer Screening: (Low Dose CT Chest recommended if Age 13-80 years, 30 pack-year currently smoking OR have quit w/in 15years.) does not qualify.   Additional Screening:  Hepatitis C Screening: does not qualify.  Vision Screening: Recommended annual ophthalmology exams for early detection of glaucoma and other disorders of the eye. Is the patient up to date with their annual eye exam?  Yes  Who is the provider or what is the name of the office in which the patient attends annual eye exams? [facility], [place] If pt is not established with a provider, would they like to be referred to a provider to establish care? No .   Dental Screening: Recommended annual dental exams for proper oral hygiene  Community Resource Referral / Chronic Care Management: CRR required this visit?  Yes   CCM required this visit?  No      Plan:     I have personally reviewed and noted the following in the patient's chart:   Medical and social history Use of alcohol, tobacco or illicit drugs  Current medications and supplements including opioid prescriptions.  Functional ability and status Nutritional status Physical activity Advanced directives List of other physicians Hospitalizations, surgeries, and ER visits in previous 12 months Vitals Screenings to include cognitive, depression, and falls Referrals and appointments  In addition, I have reviewed and discussed with patient certain preventive protocols, quality metrics, and best practice recommendations. A written personalized care plan for preventive services as well as general preventive health recommendations were provided to patient.     Donna Driver, LPN   1/61/0960   Nurse Notes: Visit completed with patient and daughter, [name]. [name] c/o issues with  being able to afford or running out of food before the end of the month. Discussed CRR referral with her and patient. Both agreeable and referral made.

## 2021-06-03 NOTE — Patient Instructions (Signed)
[name] , Thank you for taking time to come for your Medicare Wellness Visit. I appreciate your ongoing commitment to your health goals. Please review the following plan we discussed and let me know if I can assist you in the future.   Screening recommendations/referrals: Colonoscopy: No longer required. Mammogram: No longer required. Bone Density: Order placed, call to schedule.  Recommended yearly ophthalmology/optometry visit for glaucoma screening and checkup Recommended yearly dental visit for hygiene and checkup  Vaccinations: Influenza vaccine: Due Fall 2023. Pneumococcal vaccine: Discussed. Prevnar-20, one dose. Tdap vaccine: Due Repeat in 10 years  Shingles vaccine: Done 06/12/2015. Discussed Shingrix.   Covid-19:Done 05/12/2019 and 06/02/2019.  Advanced directives: Please bring a copy of your health care power of attorney and living will to the office to be added to your chart at your convenience.   Conditions/risks identified: KEEP UP THE GOOD WORK!!  Next appointment: Follow up in one year for your annual wellness visit 2024.   Preventive Care 9 Years and Older, Female Preventive care refers to lifestyle choices and visits with your health care provider that can promote health and wellness. What does preventive care include? A yearly physical exam. This is also called an annual well check. Dental exams once or twice a year. Routine eye exams. Ask your health care provider how often you should have your eyes checked. Personal lifestyle choices, including: Daily care of your teeth and gums. Regular physical activity. Eating a healthy diet. Avoiding tobacco and drug use. Limiting alcohol use. Practicing safe sex. Taking low-dose aspirin every day. Taking vitamin and mineral supplements as recommended by your health care provider. What happens during an annual well check? The services and screenings done by your health care provider during your annual well check  will depend on your age, overall health, lifestyle risk factors, and family history of disease. Counseling  Your health care provider may ask you questions about your: Alcohol use. Tobacco use. Drug use. Emotional well-being. Home and relationship well-being. Sexual activity. Eating habits. History of falls. Memory and ability to understand (cognition). Work and work Statistician. Reproductive health. Screening  You may have the following tests or measurements: Height, weight, and BMI. Blood pressure. Lipid and cholesterol levels. These may be checked every 5 years, or more frequently if you are over 48 years old. Skin check. Lung cancer screening. You may have this screening every year starting at age 11 if you have a 30-pack-year history of smoking and currently smoke or have quit within the past 15 years. Fecal occult blood test (FOBT) of the stool. You may have this test every year starting at age 74. Flexible sigmoidoscopy or colonoscopy. You may have a sigmoidoscopy every 5 years or a colonoscopy every 10 years starting at age 9. Hepatitis C blood test. Hepatitis B blood test. Sexually transmitted disease (STD) testing. Diabetes screening. This is done by checking your blood sugar (glucose) after you have not eaten for a while (fasting). You may have this done every 1-3 years. Bone density scan. This is done to screen for osteoporosis. You may have this done starting at age 44. Mammogram. This may be done every 1-2 years. Talk to your health care provider about how often you should have regular mammograms. Talk with your health care provider about your test results, treatment options, and if necessary, the need for more tests. Vaccines  Your health care provider may recommend certain vaccines, such as: Influenza vaccine. This is recommended every year. Tetanus, diphtheria, and acellular pertussis (Tdap,  Td) vaccine. You may need a Td booster every 10 years. Zoster vaccine. You  may need this after age 4. Pneumococcal 13-valent conjugate (PCV13) vaccine. One dose is recommended after age 75. Pneumococcal polysaccharide (PPSV23) vaccine. One dose is recommended after age 50. Talk to your health care provider about which screenings and vaccines you need and how often you need them. This information is not intended to replace advice given to you by your health care provider. Make sure you discuss any questions you have with your health care provider. Document Released: 01/24/2015 Document Revised: 09/17/2015 Document Reviewed: 10/29/2014 Elsevier Interactive Patient Education  2017 [employer] Prevention in the Home Falls can cause injuries. They can happen to people of all ages. There are many things you can do to make your home safe and to help prevent falls. What can I do on the outside of my home? Regularly fix the edges of walkways and driveways and fix any cracks. Remove anything that might make you trip as you walk through a door, such as a raised step or threshold. Trim any bushes or trees on the path to your home. Use bright outdoor lighting. Clear any walking paths of anything that might make someone trip, such as rocks or tools. Regularly check to see if handrails are loose or broken. Make sure that both sides of any steps have handrails. Any raised decks and porches should have guardrails on the edges. Have any leaves, snow, or ice cleared regularly. Use sand or salt on walking paths during winter. Clean up any spills in your garage right away. This includes oil or grease spills. What can I do in the bathroom? Use night lights. Install grab bars by the toilet and in the tub and shower. Do not use towel bars as grab bars. Use non-skid mats or decals in the tub or shower. If you need to sit down in the shower, use a plastic, non-slip stool. Keep the floor dry. Clean up any water that spills on the floor as soon as it happens. Remove soap buildup  in the tub or shower regularly. Attach bath mats securely with double-sided non-slip rug tape. Do not have throw rugs and other things on the floor that can make you trip. What can I do in the bedroom? Use night lights. Make sure that you have a light by your bed that is easy to reach. Do not use any sheets or blankets that are too big for your bed. They should not hang down onto the floor. Have a firm chair that has side arms. You can use this for support while you get dressed. Do not have throw rugs and other things on the floor that can make you trip. What can I do in the kitchen? Clean up any spills right away. Avoid walking on wet floors. Keep items that you use a lot in easy-to-reach places. If you need to reach something above you, use a strong step stool that has a grab bar. Keep electrical cords out of the way. Do not use floor polish or wax that makes floors slippery. If you must use wax, use non-skid floor wax. Do not have throw rugs and other things on the floor that can make you trip. What can I do with my stairs? Do not leave any items on the stairs. Make sure that there are handrails on both sides of the stairs and use them. Fix handrails that are broken or loose. Make sure that handrails are as  long as the stairways. Check any carpeting to make sure that it is firmly attached to the stairs. Fix any carpet that is loose or worn. Avoid having throw rugs at the top or bottom of the stairs. If you do have throw rugs, attach them to the floor with carpet tape. Make sure that you have a light switch at the top of the stairs and the bottom of the stairs. If you do not have them, ask someone to add them for you. What else can I do to help prevent falls? Wear shoes that: Do not have high heels. Have rubber bottoms. Are comfortable and fit you well. Are closed at the toe. Do not wear sandals. If you use a stepladder: Make sure that it is fully opened. Do not climb a closed  stepladder. Make sure that both sides of the stepladder are locked into place. Ask someone to hold it for you, if possible. Clearly mark and make sure that you can see: Any grab bars or handrails. First and last steps. Where the edge of each step is. Use tools that help you move around (mobility aids) if they are needed. These include: Canes. Walkers. Scooters. Crutches. Turn on the lights when you go into a dark area. Replace any light bulbs as soon as they burn out. Set up your furniture so you have a clear path. Avoid moving your furniture around. If any of your floors are uneven, fix them. If there are any pets around you, be aware of where they are. Review your medicines with your doctor. Some medicines can make you feel dizzy. This can increase your chance of falling. Ask your doctor what other things that you can do to help prevent falls. This information is not intended to replace advice given to you by your health care provider. Make sure you discuss any questions you have with your health care provider. Document Released: 10/24/2008 Document Revised: 06/05/2015 Document Reviewed: 02/01/2014 Elsevier Interactive Patient Education  2017 [employer].

## 2021-06-03 NOTE — Telephone Encounter (Signed)
Completed AWVS with pt and daughter, [name]. Pt and daughter request a refill on patient's Norco due to chronic back pain. Pt also c/o issues with increased depression and would like to know if she needs additional medication or a change in medication.   I advised both the patient and her daughter, that [name] will probably need and appointment to be seen but they requested that I ask you first. Thank you.

## 2021-06-03 NOTE — Telephone Encounter (Signed)
Patient will need an office visit.  Also, it looks like she has been getting Norco from her orthopedist?  I do not prescribe this medication routinely.  We can certainly meet to discuss other options for pain management.

## 2021-06-03 NOTE — Telephone Encounter (Signed)
Left message to return call to our office for daughter.

## 2021-06-04 NOTE — Telephone Encounter (Signed)
Left message to return call to our office.  

## 2021-06-10 ENCOUNTER — Telehealth: Payer: Self-pay | Admitting: *Deleted

## 2021-06-10 NOTE — Telephone Encounter (Signed)
   Telephone encounter was:  Successful.  06/10/2021 Name: GIANA CASTNER MRN: [id number] DOB: 1928/04/05  Verdie Mosher [place] is a 88 y.o. year old female who is a primary care patient of Pleas Koch, NP . The community resource team was consulted for assistance with Food Insecurity, Home Modifications, and Caregiver Stress  Care guide performed the following interventions: Patient provided with information about care guide support team and interviewed to confirm resource needs Obtained verbal consent to place patient referral to [facility] .Patients daughter provided various resources as needed via email , daughter confirmed receipt   Follow Up Plan:  No further follow up planned at this time. The patient has been provided with needed resources.  [facility], Care Management  [phone] [address] , [place] [address] Email : [email or web address]

## 2021-06-15 ENCOUNTER — Other Ambulatory Visit: Payer: Self-pay | Admitting: Primary Care

## 2021-06-15 DIAGNOSIS — F419 Anxiety disorder, unspecified: Secondary | ICD-10-CM

## 2021-06-15 DIAGNOSIS — F32A Depression, unspecified: Secondary | ICD-10-CM

## 2021-06-19 NOTE — Telephone Encounter (Signed)
Looks like patient has 7/11. I never got a call back do I need to call her and get in sooner?

## 2021-06-19 NOTE — Telephone Encounter (Signed)
Okay to be seen as scheduled.

## 2021-07-21 ENCOUNTER — Other Ambulatory Visit: Payer: Self-pay | Admitting: Primary Care

## 2021-07-21 ENCOUNTER — Ambulatory Visit (INDEPENDENT_AMBULATORY_CARE_PROVIDER_SITE_OTHER): Payer: Medicare Other | Admitting: Primary Care

## 2021-07-21 VITALS — BP 120/64 | HR 76 | Temp 97.7°F | Ht 60.0 in | Wt 101.0 lb

## 2021-07-21 DIAGNOSIS — G4700 Insomnia, unspecified: Secondary | ICD-10-CM

## 2021-07-21 DIAGNOSIS — R630 Anorexia: Secondary | ICD-10-CM | POA: Diagnosis not present

## 2021-07-21 DIAGNOSIS — F32A Depression, unspecified: Secondary | ICD-10-CM

## 2021-07-21 DIAGNOSIS — M545 Low back pain, unspecified: Secondary | ICD-10-CM | POA: Diagnosis not present

## 2021-07-21 DIAGNOSIS — J439 Emphysema, unspecified: Secondary | ICD-10-CM

## 2021-07-21 DIAGNOSIS — G8929 Other chronic pain: Secondary | ICD-10-CM

## 2021-07-21 DIAGNOSIS — F419 Anxiety disorder, unspecified: Secondary | ICD-10-CM | POA: Diagnosis not present

## 2021-07-21 DIAGNOSIS — E039 Hypothyroidism, unspecified: Secondary | ICD-10-CM

## 2021-07-21 DIAGNOSIS — M8000XS Age-related osteoporosis with current pathological fracture, unspecified site, sequela: Secondary | ICD-10-CM

## 2021-07-21 DIAGNOSIS — R5383 Other fatigue: Secondary | ICD-10-CM

## 2021-07-21 MED ORDER — GABAPENTIN 100 MG PO CAPS: 100.0000 mg | ORAL_CAPSULE | Freq: Three times a day (TID) | ORAL | 0 refills | Status: DC

## 2021-07-21 MED ORDER — DULOXETINE HCL 30 MG PO CPEP: 30.0000 mg | ORAL_CAPSULE | Freq: Every day | ORAL | 0 refills | Status: DC

## 2021-07-21 NOTE — Assessment & Plan Note (Signed)
Since fall in July 2022.  Long discussion today regarding pain management and treatment that is appropriate for her age.  We will start by switching her from Zoloft to Cymbalta to see if this helps with her pain/anxiety/depression. Start gabapentin 100 mg 1-3 times daily.  Drowsiness precautions provided.  Her daughter will update.

## 2021-07-21 NOTE — Assessment & Plan Note (Signed)
Appears uncontrolled.  Stop Zoloft 100 mg. Switch to Cymbalta 30 mg daily, may need to titrate up to 60 mg.  Continue to monitor.

## 2021-07-21 NOTE — Assessment & Plan Note (Signed)
Improved with weight gain of 11 pounds since last visit!  Continue mirtazapine 15 mg at bedtime.

## 2021-07-21 NOTE — Assessment & Plan Note (Signed)
She seems to be taking her Synthroid doses correctly.  Continue Synthroid 112 mcg 2 days weekly, Synthroid 100 mcg 5 days weekly. Repeat TSH pending.

## 2021-07-21 NOTE — Assessment & Plan Note (Signed)
We will update bone density scan.  Continue Fosamax 70 mg weekly.

## 2021-07-21 NOTE — Assessment & Plan Note (Signed)
Controlled.  Continue mirtazapine 15 mg at bedtime.

## 2021-07-21 NOTE — Patient Instructions (Addendum)
Stop by the lab prior to leaving today. I will notify you of your results once received.   Please schedule your bone density scan.   Use your albuterol nebulizer treatments as needed for shortness of breath.  Stop sertraline (Zoloft) for anxiety depression.  Start duloxetine (Cymbalta) for anxiety depression.  This may also help with pain.  Start gabapentin 100 mg for pain.  Take 1 capsule by mouth 3 times daily for pain.  This may cause drowsiness.  Schedule follow-up visit for 1 month.  It was a pleasure to see you today!

## 2021-07-21 NOTE — Progress Notes (Signed)
Subjective:    Patient ID: [name], female    DOB: 06-06-1928, 88 y.o.   MRN: [id number]  HPI  [name] is a very pleasant 88 y.o. female with a history of orthostatic hypotension, hypothyroidism, compression fracture of the thoracic vertebrae, osteoporosis, chronic fatigue, anxiety depression, weight loss, insomnia, urinary incontinence who presents today for follow-up of chronic conditions and to discuss home health.  Her daughter joins [place] today.   1) Pulmonary Emphysema: Currently managed on Advair Diskus 250-50 mcg and albuterol inhaler. She is compliant to her Advair daily. She continues to experience exertional dyspnea with mild exertion. She has an albuterol nebulizer at home for which she does not use. She is mostly sedentary during her day due to chronic back pain.   2) Anxiety and Depression: Currently managed on mirtazapine 15 mg at bedtime, sertraline 100 mg daily. She isn't sure if her medication regimen helps. She does continue to notice depression and anxiety, several times weekly. Her daughter notices increased symptoms during the late afternoon/early evening.   She continues to struggle with chronic thoracic back pain since her compression fracture.   3) Hypothyroidism: Currently managed on Synthroid 112 mcg 2 days weekly, and Synthroid 100 mcg 5 days weekly.  She is taking her Synthroid every morning on empty stomach with water only.  She is due for repeat TSH today.  4) Osteoporosis: Currently managed on alendronate 70 mg weekly.  History of compression fracture to thoracic vertebrae.  Her last bone density scan was in April 2021.  5) Chronic Back Pain: History of compression fracture to thoracic back in July 2022 after a fall. Her pain mostly occurs with movement. She has little pain with rest. She has to sleep in a recliner during the night due to her back pain. History of kyphplasty in October 2022.  Her daughter is requesting pain management for her ongoing  symptoms as this prevents her from being more active.   BP Readings from Last 3 Encounters:  07/21/21 120/64  05/26/21 114/66  10/22/20 (!) 131/56   Wt Readings from Last 3 Encounters:  07/21/21 101 lb (45.8 kg)  06/03/21 90 lb (40.8 kg)  05/26/21 99 lb (44.9 kg)      Review of Systems  Constitutional:  Positive for fatigue. Negative for fever.  Respiratory:  Positive for shortness of breath.   Musculoskeletal:  Positive for arthralgias and back pain.  Psychiatric/Behavioral:  The patient is nervous/anxious.          Past Medical History:  Diagnosis Date   Anxiety    Arthritis    "hands" (10/24/2012)   B12 deficiency    "get shots twice/month" (10/24/2012)   Bursitis    Depression    Diverticulitis    Exertional shortness of breath    Headache(784.0)    "weekly at least" (10/24/2012)   Hypothyroidism    Insomnia    Ovarian cancer ([place])    mass hooked to sm. intestine/bladder   Skin cancer of face    Syncope    when neck is up or turned to side   Syncope and collapse    "first time I ever I collapsed"; confirms h/o presyncopal events (10/24/2012)   UTI (lower urinary tract infection)     Social History   Socioeconomic History   Marital status: Widowed    Spouse name: Not on file   Number of children: Not on file   Years of education: Not on file   Highest education  level: Not on file  Occupational History   Not on file  Tobacco Use   Smoking status: Never   Smokeless tobacco: Never  Vaping Use   Vaping Use: Never used  Substance and Sexual Activity   Alcohol use: No   Drug use: No   Sexual activity: Never  Other Topics Concern   Not on file  Social History Narrative   Not on file   Social Determinants of Health   Financial Resource Strain: Medium Risk (06/10/2021)   Overall Financial Resource Strain (CARDIA)    Difficulty of Paying Living Expenses: Somewhat hard  Food Insecurity: Food Insecurity Present (06/10/2021)   Hunger Vital Sign     Worried About Running Out of Food in the Last Year: Sometimes true    Ran Out of Food in the Last Year: Sometimes true  Transportation Needs: No Transportation Needs (06/10/2021)   PRAPARE - Hydrologist (Medical): No    Lack of Transportation (Non-Medical): No  Physical Activity: Insufficiently Active (06/03/2021)   Exercise Vital Sign    Days of Exercise per Week: 3 days    Minutes of Exercise per Session: 10 min  Stress: Stress Concern Present (06/03/2021)   [place]    Feeling of Stress : To some extent  Social Connections: Moderately Isolated (06/03/2021)   Social Connection and Isolation Panel [NHANES]    Frequency of Communication with Friends and Family: More than three times a week    Frequency of Social Gatherings with Friends and Family: More than three times a week    Attends Religious Services: 1 to 4 times per year    Active Member of [employer] or Organizations: No    Attends Archivist Meetings: Never    Marital Status: Widowed  Intimate Partner Violence: Not At Risk (06/03/2021)   Humiliation, Afraid, Rape, and Kick questionnaire    Fear of Current or Ex-Partner: No    Emotionally Abused: No    Physically Abused: No    Sexually Abused: No    Past Surgical History:  Procedure Laterality Date   ABDOMINAL HYSTERECTOMY     CATARACT EXTRACTION W/ INTRAOCULAR LENS  IMPLANT, BILATERAL Bilateral 2000's   DILATION AND CURETTAGE OF UTERUS     several   FOOT SURGERY Right 1943-1944   4 surgeries   IR KYPHO LUMBAR INC FX REDUCE BONE BX UNI/BIL CANNULATION INC/IMAGING  10/22/2020   IR KYPHO THORACIC WITH BONE BIOPSY  07/27/2017   THYROIDECTOMY     TONSILLECTOMY      Family History  Problem Relation Age of Onset   Breast cancer Mother    Heart Problems Father    Heart attack Father     Allergies  Allergen Reactions   Penicillins Hives and Rash    Has patient had a PCN  reaction causing immediate rash, facial/tongue/throat swelling, SOB or lightheadedness with hypotension: Yes Has patient had a PCN reaction causing severe rash involving mucus membranes or skin necrosis: Yes Has patient had a PCN reaction that required hospitalization No Has patient had a PCN reaction occurring within the last 10 years: No If all of the above answers are "NO", then may proceed with Cephalosporin use.    Prednisone Swelling    Current Outpatient Medications on File Prior to Visit  Medication Sig Dispense Refill   acetaminophen (TYLENOL) 650 MG CR tablet Take 1,300 mg by mouth every 8 (eight) hours as needed for pain.  ADVAIR DISKUS 250-50 MCG/ACT AEPB INHALE 1 PUFF INTO THE LUNGS 2 TIMES DAILY. 60 each 0   albuterol (PROVENTIL) (2.5 MG/3ML) 0.083% nebulizer solution Take 3 mLs (2.5 mg total) by nebulization every 6 (six) hours as needed for wheezing or shortness of breath. 150 mL 0   alendronate (FOSAMAX) 70 MG tablet TAKE 1 TABLET BY MOUTH ONCE A WEEK (ON MONDAYS). TAKE WITH A FULL GLASS OF WATER ON AN EMPTY STOMACH. 12 tablet 0   Cholecalciferol (VITAMIN D) 2000 units tablet Take 2,000 Units by mouth daily.     Cyanocobalamin (B-12) 5000 MCG CAPS Take 5,000 mcg by mouth daily.     docusate sodium (COLACE) 100 MG capsule Take 100 mg by mouth daily.     ferrous sulfate 325 (65 FE) MG tablet Take 325 mg by mouth daily.     mirtazapine (REMERON) 15 MG tablet TAKE 1/2 TO 1 TABLET BY MOUTH AT BEDTIME FOR SLEEP AND APPETITE. 90 tablet 1   SYNTHROID 100 MCG tablet TAKE 1 TABLET BY MOUTH ON EMPTY STOMACH WITH WATER ONLY ON SUNDAY THROUGH THURSDAY, NO FOOD OR OTHER MEDICINE FOR 30 MINUTES. 60 tablet 2   SYNTHROID 112 MCG tablet TAKE 1 TABLET BY MOUTH EVERY MORNING ON EMPTY STOMACH WITH WATER ONLY ON FRIDAY AND SATURDAY, NO FOOD OR OTHER MEDICINES FOR 30 MINUTES. 24 tablet 2   No current facility-administered medications on file prior to visit.    BP 120/64   Pulse 76   Temp  97.7 F (36.5 C) (Temporal)   Ht 5' (1.524 m)   Wt 101 lb (45.8 kg)   SpO2 97%   BMI 19.73 kg/m  Objective:   Physical Exam Cardiovascular:     Rate and Rhythm: Normal rate and regular rhythm.  Pulmonary:     Effort: Pulmonary effort is normal.     Breath sounds: Normal breath sounds.  Musculoskeletal:     Cervical back: Neck supple.     Lumbar back: No tenderness. Decreased range of motion.       Back:  Skin:    General: Skin is warm and dry.  Neurological:     Mental Status: She is oriented to person, place, and time.           Assessment & Plan:   Problem List Items Addressed This Visit       Respiratory   COPD (chronic obstructive pulmonary disease) ([place])    Appears uncontrolled. Patient and daughter declined pulmonology referral.  I offered to change her maintenance regimen, she currently declines. She will start by using her albuterol nebulizer as needed.  She is in no distress today.        Endocrine   Hypothyroidism    She seems to be taking her Synthroid doses correctly.  Continue Synthroid 112 mcg 2 days weekly, Synthroid 100 mcg 5 days weekly. Repeat TSH pending.      Relevant Orders   TSH     Musculoskeletal and Integument   Osteoporosis with current pathological fracture    We will update bone density scan.  Continue Fosamax 70 mg weekly.      Relevant Orders   VITAMIN D 25 Hydroxy (Vit-D Deficiency, Fractures)     Other   Other fatigue    Likely multifactorial including chronic pain, emphysema that appears to be uncontrolled, sedentary lifestyle, depression.  We are working to treat each of these issues. Labs pending.      Relevant Orders   Basic metabolic panel  Vitamin B12   Anxiety and depression - Primary    Appears uncontrolled.  Stop Zoloft 100 mg. Switch to Cymbalta 30 mg daily, may need to titrate up to 60 mg.  Continue to monitor.      Relevant Medications   DULoxetine (CYMBALTA) 30 MG capsule   Insomnia     Controlled.  Continue mirtazapine 15 mg at bedtime.      Decreased appetite    Improved with weight gain of 11 pounds since last visit!  Continue mirtazapine 15 mg at bedtime.      Chronic back pain    Since fall in July 2022.  Long discussion today regarding pain management and treatment that is appropriate for her age.  We will start by switching her from Zoloft to Cymbalta to see if this helps with her pain/anxiety/depression. Start gabapentin 100 mg 1-3 times daily.  Drowsiness precautions provided.  Her daughter will update.      Relevant Medications   DULoxetine (CYMBALTA) 30 MG capsule   gabapentin (NEURONTIN) 100 MG capsule       Pleas Koch, NP

## 2021-07-21 NOTE — Assessment & Plan Note (Signed)
Appears uncontrolled. Patient and daughter declined pulmonology referral.  I offered to change her maintenance regimen, she currently declines. She will start by using her albuterol nebulizer as needed.  She is in no distress today.

## 2021-07-21 NOTE — Assessment & Plan Note (Signed)
Likely multifactorial including chronic pain, emphysema that appears to be uncontrolled, sedentary lifestyle, depression.  We are working to treat each of these issues. Labs pending.

## 2021-07-22 LAB — BASIC METABOLIC PANEL
BUN: 24 mg/dL — ABNORMAL HIGH (ref 6–23)
CO2: 27 mEq/L (ref 19–32)
Calcium: 9.5 mg/dL (ref 8.4–10.5)
Chloride: 105 mEq/L (ref 96–112)
Creatinine, Ser: 1.02 mg/dL (ref 0.40–1.20)
GFR: 48.28 mL/min — ABNORMAL LOW (ref >60.00)
Glucose, Bld: 122 mg/dL — ABNORMAL HIGH (ref 70–99)
Potassium: 4 mEq/L (ref 3.5–5.1)
Sodium: 139 mEq/L (ref 135–145)

## 2021-07-22 LAB — VITAMIN D 25 HYDROXY (VIT D DEFICIENCY, FRACTURES): VITD: 44.59 ng/mL (ref 30.00–100.00)

## 2021-07-22 LAB — VITAMIN B12: Vitamin B-12: 1240 pg/mL — ABNORMAL HIGH (ref 211–911)

## 2021-07-22 LAB — TSH: TSH: 2.8 u[IU]/mL (ref 0.35–5.50)

## 2021-07-31 ENCOUNTER — Other Ambulatory Visit: Payer: Self-pay | Admitting: Primary Care

## 2021-07-31 DIAGNOSIS — J439 Emphysema, unspecified: Secondary | ICD-10-CM

## 2021-08-17 ENCOUNTER — Telehealth: Payer: Self-pay

## 2021-08-17 NOTE — Progress Notes (Signed)
Chronic Care Management Pharmacy Assistant   Name: BRYANNE RIQUELME  MRN: [id number] DOB: 05/29/1928  Reason for Encounter: CCM (General Adherence)  Recent office visits:  07/21/21 Lonell Grandchild, NP Anxiety and Depression Abnormal Labs: "Kidney function has declined, likely dehydration. Needs to increase water intake. Thyroid and vitamin D look good. B12 is too high, reduce B12 pills to 3 days weekly." Start: CYMBALTA 30 MG Start: NEURONTIN 100 MG Stop (patient):NORCO/VICODIN 5-325 MG Stop: ZOLOFT 100 MG FU 1 month 06/03/21 AWV  Recent consult visits:  05/26/21 Cadence Furth, PA (Cardiology) Dyspnea on exertion Ordered: EKG FU 1 year  Hospital visits:  None in previous 6 months  Medications: Outpatient Encounter Medications as of 08/17/2021  Medication Sig   ADVAIR DISKUS 250-50 MCG/ACT AEPB INHALE 1 PUFF INTO THE LUNGS 2 TIMES DAILY.   acetaminophen (TYLENOL) 650 MG CR tablet Take 1,300 mg by mouth every 8 (eight) hours as needed for pain.   albuterol (PROVENTIL) (2.5 MG/3ML) 0.083% nebulizer solution Take 3 mLs (2.5 mg total) by nebulization every 6 (six) hours as needed for wheezing or shortness of breath.   alendronate (FOSAMAX) 70 MG tablet TAKE 1 TABLET BY MOUTH ONCE A WEEK (ON MONDAYS). TAKE WITH A FULL GLASS OF WATER ON AN EMPTY STOMACH.   Cholecalciferol (VITAMIN D) 2000 units tablet Take 2,000 Units by mouth daily.   Cyanocobalamin (B-12) 5000 MCG CAPS Take 5,000 mcg by mouth daily.   docusate sodium (COLACE) 100 MG capsule Take 100 mg by mouth daily.   DULoxetine (CYMBALTA) 30 MG capsule Take 1 capsule (30 mg total) by mouth daily. For anxiety and depression   ferrous sulfate 325 (65 FE) MG tablet Take 325 mg by mouth daily.   gabapentin (NEURONTIN) 100 MG capsule Take 1 capsule (100 mg total) by mouth 3 (three) times daily. For back pain.   mirtazapine (REMERON) 15 MG tablet TAKE 1/2 TO 1 TABLET BY MOUTH AT BEDTIME FOR SLEEP AND APPETITE.   SYNTHROID 100 MCG tablet TAKE 1  TABLET BY MOUTH ON EMPTY STOMACH WITH WATER ONLY ON SUNDAY THROUGH THURSDAY, NO FOOD OR OTHER MEDICINE FOR 30 MINUTES.   SYNTHROID 112 MCG tablet TAKE 1 TABLET BY MOUTH EVERY MORNING ON EMPTY STOMACH WITH WATER ONLY ON FRIDAY AND SATURDAY, NO FOOD OR OTHER MEDICINES FOR 30 MINUTES.   No facility-administered encounter medications on file as of 08/17/2021.   Contacted Cherice L Macek on 08/24/21 for general disease state and medication adherence call. Spoke with patient's daughter Sunday Spillers) for the call.   Star Medications: Medication Name/mg Last Fill Days Supply No star rating drugs noted  What concerns do you have about your medications? No concerns at this time  The patient denies side effects with their medications.   How often do you forget or accidentally miss a dose? Never  Do you use a pillbox? No  Are you having any problems getting your medications from your pharmacy? No  Has the cost of your medications been a concern? No  Since last visit with CPP, the following interventions have been made.  Start: CYMBALTA 30 MG  Start: NEURONTIN 100 MG  Reduce B12 pills to 3 days weekly Stop (patient):NORCO/VICODIN 5-325 MG  Stop: ZOLOFT 100 MG   The patient has not had an ED visit since last contact.   The patient denies problems with their health.   Patient denies concerns or questions for Charlene Brooke, PharmD at this time.   Care Gaps: Annual wellness visit in last  year? Yes on 06/03/2021 Most Recent BP reading: 120/64 on 07/21/2021  Upcoming appointments: CCM appointment on 02/24/2022  Charlene Brooke, CPP notified  Marijean Niemann, [place] Assistant [phone]

## 2021-08-24 ENCOUNTER — Other Ambulatory Visit: Payer: Self-pay | Admitting: Primary Care

## 2021-08-24 DIAGNOSIS — G8929 Other chronic pain: Secondary | ICD-10-CM

## 2021-08-25 NOTE — Telephone Encounter (Signed)
Called patient and daughter. It is helping with  the back pain she is taking 1- 3 times a day . No side effects. Would like to continue and have refills sent in.

## 2021-08-25 NOTE — Telephone Encounter (Signed)
Please call patient and her daughter:  How's she doing on the gabapentin for her back pain? Is this helping? Is she taking 100 mg TID?

## 2021-08-26 NOTE — Telephone Encounter (Signed)
Noted. Refill(s) sent to pharmacy.  

## 2021-09-16 ENCOUNTER — Other Ambulatory Visit: Payer: Self-pay | Admitting: Primary Care

## 2021-09-16 DIAGNOSIS — M8000XS Age-related osteoporosis with current pathological fracture, unspecified site, sequela: Secondary | ICD-10-CM

## 2021-09-17 NOTE — Telephone Encounter (Signed)
Called daughter. She is aware that appointment needs to be made. She will call and get that set up. She had the contact information and did not need sent or provided.

## 2021-09-17 NOTE — Telephone Encounter (Signed)
Please notify patient's daughter that she is overdue for her bone density scan. This was ordered in May 2023. She needs to call the breast center to schedule. She is on bone density medication so her bone density needs to be monitored.

## 2021-09-28 ENCOUNTER — Other Ambulatory Visit: Payer: Self-pay | Admitting: Primary Care

## 2021-09-28 DIAGNOSIS — F419 Anxiety disorder, unspecified: Secondary | ICD-10-CM

## 2021-09-28 NOTE — Telephone Encounter (Signed)
Please call patient/daughter:  How is her anxiety/depression since we switched her to the duloxetine (Cymbalta) medication in July?  We received a refill notification from her pharmacy.

## 2021-09-29 NOTE — Telephone Encounter (Signed)
Noted and glad to hear that she's doing better! Refill(s) sent to pharmacy.

## 2021-09-29 NOTE — Telephone Encounter (Signed)
Patients daughter states patient is doing very well since switching to Cymbalta. Would like the refill.

## 2021-10-09 DIAGNOSIS — H02132 Senile ectropion of right lower eyelid: Secondary | ICD-10-CM | POA: Diagnosis not present

## 2021-10-09 DIAGNOSIS — H52213 Irregular astigmatism, bilateral: Secondary | ICD-10-CM | POA: Diagnosis not present

## 2021-10-09 DIAGNOSIS — H04221 Epiphora due to insufficient drainage, right lacrimal gland: Secondary | ICD-10-CM | POA: Diagnosis not present

## 2021-10-09 DIAGNOSIS — H02135 Senile ectropion of left lower eyelid: Secondary | ICD-10-CM | POA: Diagnosis not present

## 2021-10-09 DIAGNOSIS — H35371 Puckering of macula, right eye: Secondary | ICD-10-CM | POA: Diagnosis not present

## 2021-10-09 DIAGNOSIS — Z9841 Cataract extraction status, right eye: Secondary | ICD-10-CM | POA: Diagnosis not present

## 2021-10-09 DIAGNOSIS — Z9842 Cataract extraction status, left eye: Secondary | ICD-10-CM | POA: Diagnosis not present

## 2021-10-29 ENCOUNTER — Other Ambulatory Visit: Payer: Self-pay | Admitting: Primary Care

## 2021-10-29 DIAGNOSIS — E039 Hypothyroidism, unspecified: Secondary | ICD-10-CM

## 2021-12-28 ENCOUNTER — Telehealth: Payer: Self-pay | Admitting: Primary Care

## 2021-12-28 DIAGNOSIS — M8000XS Age-related osteoporosis with current pathological fracture, unspecified site, sequela: Secondary | ICD-10-CM

## 2021-12-28 NOTE — Telephone Encounter (Signed)
Please call patient:  Bone density scan is overdue and will need to be completed before we can continue to refill her bone density medication, alendronate (Fosamax). I ordered the test in May 2023, all she has to do is call [facility] to schedule.

## 2021-12-29 MED ORDER — ALENDRONATE SODIUM 70 MG PO TABS: ORAL_TABLET | ORAL | 1 refills | Status: DC

## 2021-12-29 NOTE — Telephone Encounter (Signed)
Noted.  Please notify her daughter that we understand and will continue to refill her bone density medicine.  We want to prevent a fracture at all costs if possible.  Make sure she is taking calcium and vitamin D daily.

## 2021-12-29 NOTE — Telephone Encounter (Signed)
Unable to reach patients daughter. Left voicemail to return call to our office.  Need her to call [phone] to set up patients Bone density scan in order to refill medication.

## 2021-12-29 NOTE — Telephone Encounter (Signed)
Patient daughter called in and relayed message below. She stated that [name] is getting weaker and not able to do that. She was wondering if there is something that she can buy from OTC that will help her. Please advise. Thank you!

## 2021-12-29 NOTE — Telephone Encounter (Signed)
Called and notified patients daughter, she is appreciative of the consideration.  She states that the patient is taking calcium and vitamin d daily.

## 2021-12-31 ENCOUNTER — Other Ambulatory Visit: Payer: Self-pay | Admitting: Primary Care

## 2021-12-31 DIAGNOSIS — R5383 Other fatigue: Secondary | ICD-10-CM

## 2021-12-31 DIAGNOSIS — R531 Weakness: Secondary | ICD-10-CM

## 2021-12-31 DIAGNOSIS — R630 Anorexia: Secondary | ICD-10-CM

## 2022-01-07 ENCOUNTER — Telehealth: Payer: Self-pay | Admitting: Primary Care

## 2022-01-07 NOTE — Telephone Encounter (Signed)
Called [place] with [facility] and gave approval of verbal orders. She stated the patient will need an appointment in 30 days for insurance, and she will advise patients daughter to call and schedule appointment.

## 2022-01-07 NOTE — Telephone Encounter (Signed)
Approved.  

## 2022-01-07 NOTE — Telephone Encounter (Signed)
Home Health verbal orders Caller Name:Cheryl Agency Name: [facility] number: [phone]  Requesting OT/PT/Skilled nursing/Social Work/Speech:  Reason:Skilled nursing , CNA , PT  Frequency  Please forward to [facility] pool or providers CMA

## 2022-01-13 ENCOUNTER — Telehealth (INDEPENDENT_AMBULATORY_CARE_PROVIDER_SITE_OTHER): Payer: Medicare Other | Admitting: Primary Care

## 2022-01-13 ENCOUNTER — Encounter: Payer: Self-pay | Admitting: Primary Care

## 2022-01-13 ENCOUNTER — Telehealth: Payer: Medicare Other | Admitting: Primary Care

## 2022-01-13 DIAGNOSIS — R5383 Other fatigue: Secondary | ICD-10-CM

## 2022-01-13 DIAGNOSIS — R350 Frequency of micturition: Secondary | ICD-10-CM

## 2022-01-13 DIAGNOSIS — M7989 Other specified soft tissue disorders: Secondary | ICD-10-CM | POA: Insufficient documentation

## 2022-01-13 NOTE — Assessment & Plan Note (Signed)
Likely secondary to venous insufficiency, especially given improvement with elevation. Discussed to elevate, try compression socks.  Would not recommend diuretic as she is already experiencing this. Also with risk of falls. Discussed this with daughter and patient.

## 2022-01-13 NOTE — Progress Notes (Signed)
Patient ID: [name], female    DOB: 12-11-1928, 89 y.o.   MRN: [id number]  Virtual visit completed through [place], a video enabled telemedicine application. Due to national recommendations of social distancing due to COVID-19, a virtual visit is felt to be most appropriate for this patient at this time. Reviewed limitations, risks, security and privacy concerns of performing a virtual visit and the availability of in person appointments. I also reviewed that there may be a patient responsible charge related to this service. The patient agreed to proceed.   Patient location: home Provider location: [place] at [facility], office Persons participating in this virtual visit: patient, provider, patient's daughter  If any vitals were documented, they were collected by patient at home unless specified below.    There were no vitals taken for this visit.   CC: Home health referral Subjective:   HPI: [name] is a 89 y.o. female presenting on 01/13/2022 for [place] ([id number] //Patients daughter would like to see about getting patient on a fluid pill, she states she is using the bathroom a lot and her feet and legs are swelling. )   [facility] and physical therapy. Due to insurance reasons, a face-to-face visit is required within 30 days of referral for patient to continue participation.   Her daughter joins [employer] who is helping to provide the information for HPI. The request for home health is due to her gradual decline in overall health. Her daughter is needing assistance with her ADL's including bathing and meal preporation as the patient continues to feel weaker and weaker. Her daughter does most of her household work and meal preparation. She lives alone but her daughter lives nearby.   Her daughter is also requesting a "fluid pill" for lower extremity swelling. She is getting up  at least 5 times nightly to urinate. She denies intake of fluid before bed, but she does drink caffeine (coffee) in the evening. Her lower extremity swelling improves with elevation in her recliner. She does not wear compression socks.   She does not qualify for hospice.       Relevant past medical, surgical, family and social history reviewed and updated as indicated. Interim medical history since our last visit reviewed. Allergies and medications reviewed and updated. Outpatient Medications Prior to Visit  Medication Sig Dispense Refill   acetaminophen (TYLENOL) 650 MG CR tablet Take 1,300 mg by mouth every 8 (eight) hours as needed for pain.     ADVAIR DISKUS 250-50 MCG/ACT AEPB INHALE 1 PUFF INTO THE LUNGS 2 TIMES DAILY. 60 each 5   albuterol (PROVENTIL) (2.5 MG/3ML) 0.083% nebulizer solution Take 3 mLs (2.5 mg total) by nebulization every 6 (six) hours as needed for wheezing or shortness of breath. 150 mL 0   alendronate (FOSAMAX) 70 MG tablet TAKE 1 TABLET BY MOUTH ONCE A WEEK (ON MONDAYS). TAKE WITH A FULL GLASS OF WATER ON AN EMPTY STOMACH. 12 tablet 1   Cholecalciferol (VITAMIN D) 2000 units tablet Take 2,000 Units by mouth daily.     Cyanocobalamin (B-12) 5000 MCG CAPS Take 5,000 mcg by mouth daily.     docusate sodium (COLACE) 100 MG capsule Take 100 mg by mouth daily.     DULoxetine (CYMBALTA) 30 MG capsule TAKE 1 CAPSULE BY MOUTH DAILY FOR ANXIETY AND DEPRESSION 90 capsule 2   ferrous sulfate 325 (65 FE) MG tablet  Take 325 mg by mouth daily.     gabapentin (NEURONTIN) 100 MG capsule TAKE 1 CAPSULE BY MOUTH 3 TIMES DAILY FOR BACK PAIN. 270 capsule 1   mirtazapine (REMERON) 15 MG tablet TAKE 1/2 TO 1 TABLET BY MOUTH AT BEDTIME FOR SLEEP AND APPETITE. 90 tablet 3   SYNTHROID 100 MCG tablet TAKE 1 TABLET BY MOUTH ON EMPTY STOMACH WITH WATER ONLY ON SUNDAY THROUGH THURSDAY, NO FOOD OR OTHER MEDICINE FOR 30 MINUTES. 60 tablet 2   SYNTHROID 112 MCG tablet TAKE 1 TABLET BY MOUTH EVERY  MORNING ON EMPTY STOMACH WITH WATER ONLY ON FRIDAY AND SATURDAY, NO FOOD OR OTHER MEDICINES FOR 30 MINUTES. 24 tablet 2   No facility-administered medications prior to visit.     Per HPI unless specifically indicated in ROS section below Review of Systems  Constitutional:  Positive for fatigue.  Respiratory:  Negative for shortness of breath.   Cardiovascular:  Positive for leg swelling.  Skin:  Negative for color change.  Neurological:  Positive for weakness.   Objective:  There were no vitals taken for this visit.  Wt Readings from Last 3 Encounters:  07/21/21 101 lb (45.8 kg)  06/03/21 90 lb (40.8 kg)  05/26/21 99 lb (44.9 kg)       Physical exam: General: Alert and oriented x 3, no distress, does not appear sickly  Pulmonary: Speaks in complete sentences without increased work of breathing, no cough during visit.  Psychiatric: Normal mood, thought content, and behavior.  Cardiovascular: No lower extremity edema noted on exam. Exam limited given video platform.      Results for orders placed or performed in visit on 46/50/35  Basic metabolic panel  Result Value Ref Range   Sodium 139 135 - 145 mEq/L   Potassium 4.0 3.5 - 5.1 mEq/L   Chloride 105 96 - 112 mEq/L   CO2 27 19 - 32 mEq/L   Glucose, Bld 122 (H) 70 - 99 mg/dL   BUN 24 (H) 6 - 23 mg/dL   Creatinine, Ser 1.02 0.40 - 1.20 mg/dL   GFR 48.28 (L) >60.00 mL/min   Calcium 9.5 8.4 - 10.5 mg/dL  TSH  Result Value Ref Range   TSH 2.80 0.35 - 5.50 uIU/mL  VITAMIN D 25 Hydroxy (Vit-D Deficiency, Fractures)  Result Value Ref Range   VITD 44.59 30.00 - 100.00 ng/mL  Vitamin B12  Result Value Ref Range   Vitamin B-12 1,240 (H) 211 - 911 pg/mL   Assessment & Plan:   Problem List Items Addressed This Visit       Other   Other fatigue - Primary    Progressing. Face to face visit completed today.  Agree with home health nursing/CNA and PT. She does not qualify for hospice.   Referral has already been  placed. Will have office notes faxed.      Urinary frequency    Likely secondary to caffeine consumption at night.  Discussed to avoid caffeine after 12 pm, limit liquids and sugar prior to bedtime. Daughter will update.       Swelling of lower extremity    Likely secondary to venous insufficiency, especially given improvement with elevation. Discussed to elevate, try compression socks.  Would not recommend diuretic as she is already experiencing this. Also with risk of falls. Discussed this with daughter and patient.         No orders of the defined types were placed in this encounter.  No orders of the defined types  were placed in this encounter.   I discussed the assessment and treatment plan with the patient. The patient was provided an opportunity to ask questions and all were answered. The patient agreed with the plan and demonstrated an understanding of the instructions. The patient was advised to call back or seek an in-person evaluation if the symptoms worsen or if the condition fails to improve as anticipated.  Follow up plan:  Continue to elevate your legs when resting. Consider wearing compression socks.   Avoid caffeine intake after 12 pm.  Limit liquids and sugar before bedtime.  We will update Home Health of this visit.   It was a pleasure to see you today!    Pleas Koch, NP

## 2022-01-13 NOTE — Assessment & Plan Note (Signed)
Progressing. Face to face visit completed today.  Agree with home health nursing/CNA and PT. She does not qualify for hospice.   Referral has already been placed. Will have office notes faxed.

## 2022-01-13 NOTE — Assessment & Plan Note (Signed)
Likely secondary to caffeine consumption at night.  Discussed to avoid caffeine after 12 pm, limit liquids and sugar prior to bedtime. Daughter will update.

## 2022-01-13 NOTE — Patient Instructions (Signed)
Continue to elevate your legs when resting. Consider wearing compression socks.   Avoid caffeine intake after 12 pm.  Limit liquids and sugar before bedtime.  We will update Home Health of this visit.   It was a pleasure to see you today!

## 2022-01-16 DIAGNOSIS — G4700 Insomnia, unspecified: Secondary | ICD-10-CM | POA: Diagnosis not present

## 2022-01-16 DIAGNOSIS — M545 Low back pain, unspecified: Secondary | ICD-10-CM | POA: Diagnosis not present

## 2022-01-16 DIAGNOSIS — Z9181 History of falling: Secondary | ICD-10-CM | POA: Diagnosis not present

## 2022-01-16 DIAGNOSIS — R32 Unspecified urinary incontinence: Secondary | ICD-10-CM | POA: Diagnosis not present

## 2022-01-16 DIAGNOSIS — R600 Localized edema: Secondary | ICD-10-CM | POA: Diagnosis not present

## 2022-01-16 DIAGNOSIS — E039 Hypothyroidism, unspecified: Secondary | ICD-10-CM | POA: Diagnosis not present

## 2022-01-16 DIAGNOSIS — M1990 Unspecified osteoarthritis, unspecified site: Secondary | ICD-10-CM | POA: Diagnosis not present

## 2022-01-16 DIAGNOSIS — Z8781 Personal history of (healed) traumatic fracture: Secondary | ICD-10-CM | POA: Diagnosis not present

## 2022-01-16 DIAGNOSIS — F419 Anxiety disorder, unspecified: Secondary | ICD-10-CM | POA: Diagnosis not present

## 2022-01-16 DIAGNOSIS — G8929 Other chronic pain: Secondary | ICD-10-CM | POA: Diagnosis not present

## 2022-01-16 DIAGNOSIS — Z85828 Personal history of other malignant neoplasm of skin: Secondary | ICD-10-CM | POA: Diagnosis not present

## 2022-01-16 DIAGNOSIS — J439 Emphysema, unspecified: Secondary | ICD-10-CM | POA: Diagnosis not present

## 2022-01-16 DIAGNOSIS — I872 Venous insufficiency (chronic) (peripheral): Secondary | ICD-10-CM | POA: Diagnosis not present

## 2022-01-16 DIAGNOSIS — F32A Depression, unspecified: Secondary | ICD-10-CM | POA: Diagnosis not present

## 2022-01-16 DIAGNOSIS — E538 Deficiency of other specified B group vitamins: Secondary | ICD-10-CM | POA: Diagnosis not present

## 2022-01-16 DIAGNOSIS — M810 Age-related osteoporosis without current pathological fracture: Secondary | ICD-10-CM | POA: Diagnosis not present

## 2022-01-16 DIAGNOSIS — I951 Orthostatic hypotension: Secondary | ICD-10-CM | POA: Diagnosis not present

## 2022-01-16 DIAGNOSIS — Z8673 Personal history of transient ischemic attack (TIA), and cerebral infarction without residual deficits: Secondary | ICD-10-CM | POA: Diagnosis not present

## 2022-01-16 DIAGNOSIS — Z9089 Acquired absence of other organs: Secondary | ICD-10-CM | POA: Diagnosis not present

## 2022-01-16 DIAGNOSIS — Z87440 Personal history of urinary (tract) infections: Secondary | ICD-10-CM | POA: Diagnosis not present

## 2022-01-16 DIAGNOSIS — R5383 Other fatigue: Secondary | ICD-10-CM | POA: Diagnosis not present

## 2022-01-19 DIAGNOSIS — R600 Localized edema: Secondary | ICD-10-CM | POA: Diagnosis not present

## 2022-01-19 DIAGNOSIS — R5383 Other fatigue: Secondary | ICD-10-CM | POA: Diagnosis not present

## 2022-01-19 DIAGNOSIS — I872 Venous insufficiency (chronic) (peripheral): Secondary | ICD-10-CM | POA: Diagnosis not present

## 2022-01-19 DIAGNOSIS — J439 Emphysema, unspecified: Secondary | ICD-10-CM | POA: Diagnosis not present

## 2022-01-19 DIAGNOSIS — G8929 Other chronic pain: Secondary | ICD-10-CM | POA: Diagnosis not present

## 2022-01-19 DIAGNOSIS — M545 Low back pain, unspecified: Secondary | ICD-10-CM | POA: Diagnosis not present

## 2022-01-26 DIAGNOSIS — I872 Venous insufficiency (chronic) (peripheral): Secondary | ICD-10-CM | POA: Diagnosis not present

## 2022-01-26 DIAGNOSIS — G8929 Other chronic pain: Secondary | ICD-10-CM | POA: Diagnosis not present

## 2022-01-26 DIAGNOSIS — R600 Localized edema: Secondary | ICD-10-CM | POA: Diagnosis not present

## 2022-01-26 DIAGNOSIS — J439 Emphysema, unspecified: Secondary | ICD-10-CM | POA: Diagnosis not present

## 2022-01-26 DIAGNOSIS — M545 Low back pain, unspecified: Secondary | ICD-10-CM | POA: Diagnosis not present

## 2022-01-26 DIAGNOSIS — R5383 Other fatigue: Secondary | ICD-10-CM | POA: Diagnosis not present

## 2022-02-02 DIAGNOSIS — J439 Emphysema, unspecified: Secondary | ICD-10-CM | POA: Diagnosis not present

## 2022-02-02 DIAGNOSIS — M545 Low back pain, unspecified: Secondary | ICD-10-CM | POA: Diagnosis not present

## 2022-02-02 DIAGNOSIS — R5383 Other fatigue: Secondary | ICD-10-CM | POA: Diagnosis not present

## 2022-02-02 DIAGNOSIS — G8929 Other chronic pain: Secondary | ICD-10-CM | POA: Diagnosis not present

## 2022-02-02 DIAGNOSIS — R600 Localized edema: Secondary | ICD-10-CM | POA: Diagnosis not present

## 2022-02-02 DIAGNOSIS — I872 Venous insufficiency (chronic) (peripheral): Secondary | ICD-10-CM | POA: Diagnosis not present

## 2022-02-05 DIAGNOSIS — J439 Emphysema, unspecified: Secondary | ICD-10-CM | POA: Diagnosis not present

## 2022-02-05 DIAGNOSIS — M545 Low back pain, unspecified: Secondary | ICD-10-CM | POA: Diagnosis not present

## 2022-02-05 DIAGNOSIS — R600 Localized edema: Secondary | ICD-10-CM | POA: Diagnosis not present

## 2022-02-05 DIAGNOSIS — R5383 Other fatigue: Secondary | ICD-10-CM | POA: Diagnosis not present

## 2022-02-05 DIAGNOSIS — G8929 Other chronic pain: Secondary | ICD-10-CM | POA: Diagnosis not present

## 2022-02-05 DIAGNOSIS — I872 Venous insufficiency (chronic) (peripheral): Secondary | ICD-10-CM | POA: Diagnosis not present

## 2022-02-15 DIAGNOSIS — G8929 Other chronic pain: Secondary | ICD-10-CM | POA: Diagnosis not present

## 2022-02-15 DIAGNOSIS — Z9181 History of falling: Secondary | ICD-10-CM | POA: Diagnosis not present

## 2022-02-15 DIAGNOSIS — R32 Unspecified urinary incontinence: Secondary | ICD-10-CM | POA: Diagnosis not present

## 2022-02-15 DIAGNOSIS — M545 Low back pain, unspecified: Secondary | ICD-10-CM | POA: Diagnosis not present

## 2022-02-15 DIAGNOSIS — E538 Deficiency of other specified B group vitamins: Secondary | ICD-10-CM | POA: Diagnosis not present

## 2022-02-15 DIAGNOSIS — E039 Hypothyroidism, unspecified: Secondary | ICD-10-CM | POA: Diagnosis not present

## 2022-02-15 DIAGNOSIS — Z87440 Personal history of urinary (tract) infections: Secondary | ICD-10-CM | POA: Diagnosis not present

## 2022-02-15 DIAGNOSIS — Z9089 Acquired absence of other organs: Secondary | ICD-10-CM | POA: Diagnosis not present

## 2022-02-15 DIAGNOSIS — Z8673 Personal history of transient ischemic attack (TIA), and cerebral infarction without residual deficits: Secondary | ICD-10-CM | POA: Diagnosis not present

## 2022-02-15 DIAGNOSIS — F32A Depression, unspecified: Secondary | ICD-10-CM | POA: Diagnosis not present

## 2022-02-15 DIAGNOSIS — M1990 Unspecified osteoarthritis, unspecified site: Secondary | ICD-10-CM | POA: Diagnosis not present

## 2022-02-15 DIAGNOSIS — M810 Age-related osteoporosis without current pathological fracture: Secondary | ICD-10-CM | POA: Diagnosis not present

## 2022-02-15 DIAGNOSIS — I872 Venous insufficiency (chronic) (peripheral): Secondary | ICD-10-CM | POA: Diagnosis not present

## 2022-02-15 DIAGNOSIS — G4700 Insomnia, unspecified: Secondary | ICD-10-CM | POA: Diagnosis not present

## 2022-02-15 DIAGNOSIS — J439 Emphysema, unspecified: Secondary | ICD-10-CM | POA: Diagnosis not present

## 2022-02-15 DIAGNOSIS — F419 Anxiety disorder, unspecified: Secondary | ICD-10-CM | POA: Diagnosis not present

## 2022-02-15 DIAGNOSIS — Z8781 Personal history of (healed) traumatic fracture: Secondary | ICD-10-CM | POA: Diagnosis not present

## 2022-02-15 DIAGNOSIS — R5383 Other fatigue: Secondary | ICD-10-CM | POA: Diagnosis not present

## 2022-02-15 DIAGNOSIS — I951 Orthostatic hypotension: Secondary | ICD-10-CM | POA: Diagnosis not present

## 2022-02-15 DIAGNOSIS — R600 Localized edema: Secondary | ICD-10-CM | POA: Diagnosis not present

## 2022-02-15 DIAGNOSIS — Z85828 Personal history of other malignant neoplasm of skin: Secondary | ICD-10-CM | POA: Diagnosis not present

## 2022-02-16 ENCOUNTER — Encounter: Payer: Self-pay | Admitting: Primary Care

## 2022-02-16 ENCOUNTER — Ambulatory Visit (INDEPENDENT_AMBULATORY_CARE_PROVIDER_SITE_OTHER): Payer: Medicare Other | Admitting: Primary Care

## 2022-02-16 ENCOUNTER — Ambulatory Visit (INDEPENDENT_AMBULATORY_CARE_PROVIDER_SITE_OTHER)
Admission: RE | Admit: 2022-02-16 | Discharge: 2022-02-16 | Disposition: A | Discharge status: Home / Self Care | Payer: Medicare Other | Source: Ambulatory Visit | Attending: Primary Care | Admitting: Primary Care

## 2022-02-16 VITALS — BP 126/78 | HR 68 | Temp 98.6°F | Ht 60.0 in | Wt 128.0 lb

## 2022-02-16 DIAGNOSIS — R0609 Other forms of dyspnea: Secondary | ICD-10-CM

## 2022-02-16 DIAGNOSIS — R350 Frequency of micturition: Secondary | ICD-10-CM | POA: Diagnosis not present

## 2022-02-16 DIAGNOSIS — M7989 Other specified soft tissue disorders: Secondary | ICD-10-CM

## 2022-02-16 DIAGNOSIS — R0600 Dyspnea, unspecified: Secondary | ICD-10-CM | POA: Diagnosis not present

## 2022-02-16 LAB — BASIC METABOLIC PANEL
BUN: 14 mg/dL (ref 6–23)
CO2: 26 mEq/L (ref 19–32)
Calcium: 9.3 mg/dL (ref 8.4–10.5)
Chloride: 102 mEq/L (ref 96–112)
Creatinine, Ser: 0.84 mg/dL (ref 0.40–1.20)
GFR: 60.7 mL/min (ref >60.00)
Glucose, Bld: 91 mg/dL (ref 70–99)
Potassium: 3.6 mEq/L (ref 3.5–5.1)
Sodium: 139 mEq/L (ref 135–145)

## 2022-02-16 LAB — BRAIN NATRIURETIC PEPTIDE: Pro B Natriuretic peptide (BNP): 172 pg/mL — ABNORMAL HIGH (ref 0.0–100.0)

## 2022-02-16 NOTE — Progress Notes (Signed)
Established Patient Office Visit  Subjective   Patient ID: [name], female    DOB: 1928-06-20  Age: 89 y.o. MRN: [id number]  No chief complaint on file.   HPI  [name] is a 89 year old female with past medical history of orthostatic hypotension, COPD, hypothyroidism dyspnea, presents today for an acute visit.   Her daughter reports patient has been experiencing bilateral leg swelling. Patient reports that her swelling started from her ankles and went up to her knees and hips. Her daughter reports that they bough a diuretic over the counter five days ago and she took it for two days with no significant relief. She denies any pain, redness. She uses a walker at home. She has difficulty breathing and takes breathing treatment. Her daughter reports that when patient walks to the bathroom, she gets short-winded and has to sit down. She sleeps in a recliner since July after back surgery. She does live alone. She has been experiencing urinary frequency through out the day and night; however more at night. She tried physical therapy one time but then stopped.   She is sitting for a long period of time. She does not elevate her legs for while at times.   She has home health ordered with physical therapy, nurse and social worker. However, she has declined physical therapy after one visit.     Patient Active Problem List   Diagnosis Date Noted   Dyspnea on exertion 02/16/2022   Swelling of lower extremity 01/13/2022   Anemia 12/11/2020   Acute pelvic pain 08/08/2020   Chronic back pain 08/08/2020   Acute pain of right wrist 08/08/2020   Acute right-sided thoracic back pain 12/25/2019   Osteoporosis with current pathological fracture 03/27/2019   Decreased appetite 03/27/2019   Insomnia 09/11/2018   Urinary frequency 08/30/2018   Cough 05/01/2018   Urinary incontinence 03/21/2018   Irritation of external ear canal 09/28/2017   Compression fracture of body of thoracic vertebra  (HCC) 09/14/2017   Anxiety and depression 09/14/2017   Syncope and collapse 10/24/2012   Orthostatic hypotension 10/24/2012   Other fatigue 01/21/2011   Dizziness 01/21/2011   COPD (chronic obstructive pulmonary disease) ([place]) 01/21/2011   Hypothyroidism 03/28/2008   DYSPNEA 03/28/2008   Past Medical History:  Diagnosis Date   Anxiety    Arthritis    "hands" (10/24/2012)   B12 deficiency    "get shots twice/month" (10/24/2012)   Bursitis    Depression    Diverticulitis    Exertional shortness of breath    Headache(784.0)    "weekly at least" (10/24/2012)   Hypothyroidism    Insomnia    Ovarian cancer ([place])    mass hooked to sm. intestine/bladder   Skin cancer of face    Syncope    when neck is up or turned to side   Syncope and collapse    "first time I ever I collapsed"; confirms h/o presyncopal events (10/24/2012)   UTI (lower urinary tract infection)    Past Surgical History:  Procedure Laterality Date   ABDOMINAL HYSTERECTOMY     CATARACT EXTRACTION W/ INTRAOCULAR LENS  IMPLANT, BILATERAL Bilateral 2000's   DILATION AND CURETTAGE OF UTERUS     several   FOOT SURGERY Right 1943-1944   4 surgeries   IR KYPHO LUMBAR INC FX REDUCE BONE BX UNI/BIL CANNULATION INC/IMAGING  10/22/2020   IR KYPHO THORACIC WITH BONE BIOPSY  07/27/2017   THYROIDECTOMY     TONSILLECTOMY  Family History  Problem Relation Age of Onset   Breast cancer Mother    Heart Problems Father    Heart attack Father    Allergies  Allergen Reactions   Penicillins Hives and Rash    Has patient had a PCN reaction causing immediate rash, facial/tongue/throat swelling, SOB or lightheadedness with hypotension: Yes Has patient had a PCN reaction causing severe rash involving mucus membranes or skin necrosis: Yes Has patient had a PCN reaction that required hospitalization No Has patient had a PCN reaction occurring within the last 10 years: No If all of the above answers are "NO", then may proceed  with Cephalosporin use.    Prednisone Swelling      Review of Systems  Constitutional:  Negative for chills and fever.  Respiratory:  Positive for shortness of breath. Negative for cough.        Dyspnea on exertion.  Cardiovascular:  Positive for leg swelling. Negative for chest pain and palpitations.  Genitourinary:  Positive for frequency. Negative for dysuria.  Neurological:  Positive for dizziness. Negative for headaches.      Objective:     BP 126/78   Pulse 68   Temp 98.6 F (37 C) (Oral)   Ht 5' (1.524 m)   Wt 128 lb (58.1 kg)   SpO2 97%   BMI 25.00 kg/m  BP Readings from Last 3 Encounters:  02/16/22 126/78  07/21/21 120/64  05/26/21 114/66   Wt Readings from Last 3 Encounters:  02/16/22 128 lb (58.1 kg)  07/21/21 101 lb (45.8 kg)  06/03/21 90 lb (40.8 kg)      Physical Exam Vitals and nursing note reviewed.  Constitutional:      Appearance: Normal appearance.  Cardiovascular:     Rate and Rhythm: Normal rate and regular rhythm.     Pulses: Normal pulses.     Heart sounds: Normal heart sounds.  Pulmonary:     Effort: Pulmonary effort is normal.     Breath sounds: Normal breath sounds.  Musculoskeletal:        General: Tenderness present.     Right lower leg: Edema present.     Left lower leg: No edema.  Neurological:     Mental Status: She is alert and oriented to person, place, and time.  Psychiatric:        Mood and Affect: Mood normal.        Behavior: Behavior normal.      No results found for any visits on 02/16/22.     The ASCVD Risk score (Arnett DK, et al., 2019) failed to calculate for the following reasons:   The 2019 ASCVD risk score is only valid for ages 64 to 57    Assessment & Plan:   Problem List Items Addressed This Visit       Other   Urinary frequency    Urine sample kit provided to patient to bring back sample for UA.       Relevant Orders   POCT Urinalysis Dipstick (Automated)   Urine Culture   Swelling  of lower extremity    Differentials include exacerbation congestive heart failure, PE and DVT  STAT Venous Ultrasound of bilateral lower extremities ordered.   Chest x-ray pending.   BMP, BNP, D-dimer pending.   Discussed with patient and daughter the importance of elevating her legs throughout the day and trying compression socks. Also, restarting physical therapy would help with strengthening and endurance.       Relevant Orders  Basic Metabolic Panel   DG Chest 2 View   D-dimer, quantitative   Brain natriuretic peptide   Dyspnea on exertion - Primary   Relevant Orders   Basic Metabolic Panel   DG Chest 2 View   D-dimer, quantitative   Brain natriuretic peptide    No follow-ups on file.    Tinnie Gens, BSN-RN, DNP STUDENT

## 2022-02-16 NOTE — Patient Instructions (Signed)
Complete xray(s) prior to leaving today. I will notify you of your results once received.  Stop by the lab prior to leaving today. I will notify you of your results once received.  Please bring urine sample back to the lab.   Please elevate your legs while you are sitting. Please wear compression socks they will help with the swelling.   You will get a phone call with your ultrasound appointment.   It was a pleasure to see you today!

## 2022-02-16 NOTE — Assessment & Plan Note (Addendum)
Differentials include congestive heart failure, PE and DVT, chronic venous insufficiency.  STAT Venous Ultrasound of bilateral lower extremities ordered.   Chest x-ray pending.   BMP, BNP, D-dimer pending.   Discussed with patient and daughter the importance of elevating her legs throughout the day and trying compression socks. Also, restarting physical therapy would help with strengthening and endurance.  Await results.  I evaluated patient, was consulted regarding treatment, and agree with assessment and plan per Tinnie Gens, RN, DNP student.   Allie Bossier, NP-C

## 2022-02-16 NOTE — Progress Notes (Signed)
Subjective:    Patient ID: [name], female    DOB: [birth year], 89 y.o.   MRN: [id number]  HPI  [name] is a very pleasant 89 y.o. female with a history of orthostatic hypotension, COPD, hypothyroidism, compression fracture, chronic fatigue, chronic back pain, lower extremity swelling who presents today to discuss lower extremity swelling.  Her daughter joins [place] today who is helping to provide information for HPI.  She was last evaluated for this in January 2024, virtual visit, lower extremity edema was bilateral and improved with elevation. Her daughter was requesting a "fluid pill" for her swelling and also mentioned the patient getting up to urinate during the night at least 5 times already. Given this information it was recommended she try compression socks, elevation, and follow up for in person visit without improvement.   Her swelling is located to the bilateral lower extremities which began to her feet and ankles and since progressed to the lower legs and now up to her thighs.  Her daughter purchased an OTC diuretic two days ago, has taken for the last two days without improvement. She leads a mostly sedentary life, only gets up for food or bathroom. She sleeps in a recliner since her back surgery last Summer. She does notice exertional dyspnea each time she goes to the bathroom.   She was referred for home health PT but she and her daughter declined their services.   Her daughter has noticed intermittent diaphoresis with chills without fevers, this is new. She does not elevate her legs. She has not purchased compression socks.   She has chronic urinary frequency, is getting up 4-5 times nightly, but does urinary frequently during the day.   BP Readings from Last 3 Encounters:  02/16/22 126/78  07/21/21 120/64  05/26/21 114/66      Review of Systems  Constitutional:  Positive for fatigue.  HENT:  Negative for congestion.   Respiratory:  Positive for shortness  of breath.   Cardiovascular:  Positive for leg swelling. Negative for chest pain.  Gastrointestinal:  Negative for constipation and diarrhea.  Genitourinary:  Positive for frequency.         Past Medical History:  Diagnosis Date   Anxiety    Arthritis    "hands" (10/24/2012)   B12 deficiency    "get shots twice/month" (10/24/2012)   Bursitis    Depression    Diverticulitis    Exertional shortness of breath    Headache(784.0)    "weekly at least" (10/24/2012)   Hypothyroidism    Insomnia    Ovarian cancer ([place])    mass hooked to sm. intestine/bladder   Skin cancer of face    Syncope    when neck is up or turned to side   Syncope and collapse    "first time I ever I collapsed"; confirms h/o presyncopal events (10/24/2012)   UTI (lower urinary tract infection)     Social History   Socioeconomic History   Marital status: Widowed    Spouse name: Not on file   Number of children: Not on file   Years of education: Not on file   Highest education level: Not on file  Occupational History   Not on file  Tobacco Use   Smoking status: Never   Smokeless tobacco: Never  Vaping Use   Vaping Use: Never used  Substance and Sexual Activity   Alcohol use: No   Drug use: No   Sexual activity: Never  Other Topics  Concern   Not on file  Social History Narrative   Not on file   Social Determinants of Health   Financial Resource Strain: Medium Risk (06/10/2021)   Overall Financial Resource Strain (CARDIA)    Difficulty of Paying Living Expenses: Somewhat hard  Food Insecurity: Food Insecurity Present (06/10/2021)   Hunger Vital Sign    Worried About Running Out of Food in the Last Year: Sometimes true    Ran Out of Food in the Last Year: Sometimes true  Transportation Needs: No Transportation Needs (06/10/2021)   PRAPARE - Hydrologist (Medical): No    Lack of Transportation (Non-Medical): No  Physical Activity: Insufficiently Active (06/03/2021)    Exercise Vital Sign    Days of Exercise per Week: 3 days    Minutes of Exercise per Session: 10 min  Stress: Stress Concern Present (06/03/2021)   [place]    Feeling of Stress : To some extent  Social Connections: Moderately Isolated (06/03/2021)   Social Connection and Isolation Panel [NHANES]    Frequency of Communication with Friends and Family: More than three times a week    Frequency of Social Gatherings with Friends and Family: More than three times a week    Attends Religious Services: 1 to 4 times per year    Active Member of [employer] or Organizations: No    Attends Archivist Meetings: Never    Marital Status: Widowed  Intimate Partner Violence: Not At Risk (06/03/2021)   Humiliation, Afraid, Rape, and Kick questionnaire    Fear of Current or Ex-Partner: No    Emotionally Abused: No    Physically Abused: No    Sexually Abused: No    Past Surgical History:  Procedure Laterality Date   ABDOMINAL HYSTERECTOMY     CATARACT EXTRACTION W/ INTRAOCULAR LENS  IMPLANT, BILATERAL Bilateral 2000's   DILATION AND CURETTAGE OF UTERUS     several   FOOT SURGERY Right 1943-1944   4 surgeries   IR KYPHO LUMBAR INC FX REDUCE BONE BX UNI/BIL CANNULATION INC/IMAGING  10/22/2020   IR KYPHO THORACIC WITH BONE BIOPSY  07/27/2017   THYROIDECTOMY     TONSILLECTOMY      Family History  Problem Relation Age of Onset   Breast cancer Mother    Heart Problems Father    Heart attack Father     Allergies  Allergen Reactions   Penicillins Hives and Rash    Has patient had a PCN reaction causing immediate rash, facial/tongue/throat swelling, SOB or lightheadedness with hypotension: Yes Has patient had a PCN reaction causing severe rash involving mucus membranes or skin necrosis: Yes Has patient had a PCN reaction that required hospitalization No Has patient had a PCN reaction occurring within the last 10 years: No If  all of the above answers are "NO", then may proceed with Cephalosporin use.    Prednisone Swelling    Current Outpatient Medications on File Prior to Visit  Medication Sig Dispense Refill   acetaminophen (TYLENOL) 650 MG CR tablet Take 1,300 mg by mouth every 8 (eight) hours as needed for pain.     ADVAIR DISKUS 250-50 MCG/ACT AEPB INHALE 1 PUFF INTO THE LUNGS 2 TIMES DAILY. 60 each 5   albuterol (PROVENTIL) (2.5 MG/3ML) 0.083% nebulizer solution Take 3 mLs (2.5 mg total) by nebulization every 6 (six) hours as needed for wheezing or shortness of breath. 150 mL 0   alendronate (FOSAMAX)  70 MG tablet TAKE 1 TABLET BY MOUTH ONCE A WEEK (ON MONDAYS). TAKE WITH A FULL GLASS OF WATER ON AN EMPTY STOMACH. 12 tablet 1   Cholecalciferol (VITAMIN D) 2000 units tablet Take 2,000 Units by mouth daily.     docusate sodium (COLACE) 100 MG capsule Take 100 mg by mouth daily.     DULoxetine (CYMBALTA) 30 MG capsule TAKE 1 CAPSULE BY MOUTH DAILY FOR ANXIETY AND DEPRESSION 90 capsule 2   ferrous sulfate 325 (65 FE) MG tablet Take 325 mg by mouth daily.     gabapentin (NEURONTIN) 100 MG capsule TAKE 1 CAPSULE BY MOUTH 3 TIMES DAILY FOR BACK PAIN. 270 capsule 1   mirtazapine (REMERON) 15 MG tablet TAKE 1/2 TO 1 TABLET BY MOUTH AT BEDTIME FOR SLEEP AND APPETITE. 90 tablet 3   SYNTHROID 100 MCG tablet TAKE 1 TABLET BY MOUTH ON EMPTY STOMACH WITH WATER ONLY ON SUNDAY THROUGH THURSDAY, NO FOOD OR OTHER MEDICINE FOR 30 MINUTES. 60 tablet 2   Cyanocobalamin (B-12) 5000 MCG CAPS Take 5,000 mcg by mouth daily. (Patient not taking: Reported on 02/16/2022)     SYNTHROID 112 MCG tablet TAKE 1 TABLET BY MOUTH EVERY MORNING ON EMPTY STOMACH WITH WATER ONLY ON FRIDAY AND SATURDAY, NO FOOD OR OTHER MEDICINES FOR 30 MINUTES. (Patient not taking: Reported on 02/16/2022) 24 tablet 2   No current facility-administered medications on file prior to visit.    BP 126/78   Pulse 68   Temp 98.6 F (37 C) (Oral)   Ht 5' (1.524 m)   Wt  128 lb (58.1 kg)   SpO2 97%   BMI 25.00 kg/m  Objective:   Physical Exam Cardiovascular:     Rate and Rhythm: Normal rate and regular rhythm.     Comments: Mild to moderate right lower extremity edema from ankle to knee.  Trace edema to left ankle.   No pitting to either lower extremity. Pulmonary:     Effort: Pulmonary effort is normal.     Breath sounds: No wheezing, rhonchi or rales.     Comments: No crackles  Abdominal:     General: Bowel sounds are normal.     Palpations: Abdomen is soft.     Tenderness: There is no abdominal tenderness.  Skin:    General: Skin is warm and dry.  Neurological:     Mental Status: She is alert and oriented to person, place, and time.           Assessment & Plan:  Dyspnea on exertion Assessment & Plan: Unclear etiology, differentials include CHF, deconditioning, PE.   Checking labs and chest xray today. She is stable for out patient treatment.  Await results.  Orders: -     Basic metabolic panel -     DG Chest 2 View -     D-dimer, quantitative -     Brain natriuretic peptide  Swelling of lower extremity Assessment & Plan: Differentials include congestive heart failure, PE and DVT, chronic venous insufficiency.  STAT Venous Ultrasound of bilateral lower extremities ordered.   Chest x-ray pending.   BMP, BNP, D-dimer pending.   Discussed with patient and daughter the importance of elevating her legs throughout the day and trying compression socks. Also, restarting physical therapy would help with strengthening and endurance.  Await results.  I evaluated patient, was consulted regarding treatment, and agree with assessment and plan per Tinnie Gens, RN, DNP student.   Allie Bossier, NP-C    Orders: -  Basic metabolic panel -     DG Chest 2 View -     D-dimer, quantitative -     Brain natriuretic peptide -     VAS [place] LOWER EXTREMITY VENOUS (DVT); Future  Urinary frequency Assessment & Plan: Urine sample kit  provided to patient to bring back sample for UA.  I evaluated patient, was consulted regarding treatment, and agree with assessment and plan per Tinnie Gens, RN, DNP student.   Allie Bossier, NP-C   Orders: -     POCT Urinalysis Dipstick (Automated); Future -     Urine Culture; Future        Pleas Koch, NP

## 2022-02-16 NOTE — Assessment & Plan Note (Signed)
Unclear etiology, differentials include CHF, deconditioning, PE.   Checking labs and chest xray today. She is stable for out patient treatment.  Await results.

## 2022-02-16 NOTE — Assessment & Plan Note (Addendum)
Urine sample kit provided to patient to bring back sample for UA.  I evaluated patient, was consulted regarding treatment, and agree with assessment and plan per Tinnie Gens, RN, DNP student.   Allie Bossier, NP-C

## 2022-02-17 ENCOUNTER — Ambulatory Visit
Admission: RE | Admit: 2022-02-17 | Discharge: 2022-02-17 | Disposition: A | Discharge status: Home / Self Care | Payer: Medicare Other | Source: Ambulatory Visit | Attending: Primary Care | Admitting: Primary Care

## 2022-02-17 ENCOUNTER — Telehealth: Payer: Self-pay

## 2022-02-17 ENCOUNTER — Ambulatory Visit (HOSPITAL_COMMUNITY)
Admission: RE | Admit: 2022-02-17 | Discharge: 2022-02-17 | Disposition: A | Discharge status: Home / Self Care | Payer: Medicare Other | Source: Ambulatory Visit | Attending: Primary Care | Admitting: Primary Care

## 2022-02-17 ENCOUNTER — Other Ambulatory Visit: Payer: Self-pay | Admitting: Primary Care

## 2022-02-17 DIAGNOSIS — M7989 Other specified soft tissue disorders: Secondary | ICD-10-CM | POA: Diagnosis not present

## 2022-02-17 DIAGNOSIS — R7989 Other specified abnormal findings of blood chemistry: Secondary | ICD-10-CM

## 2022-02-17 DIAGNOSIS — R0600 Dyspnea, unspecified: Secondary | ICD-10-CM | POA: Diagnosis not present

## 2022-02-17 DIAGNOSIS — J90 Pleural effusion, not elsewhere classified: Secondary | ICD-10-CM | POA: Diagnosis not present

## 2022-02-17 DIAGNOSIS — R0609 Other forms of dyspnea: Secondary | ICD-10-CM | POA: Insufficient documentation

## 2022-02-17 DIAGNOSIS — I82811 Embolism and thrombosis of superficial veins of right lower extremities: Secondary | ICD-10-CM

## 2022-02-17 DIAGNOSIS — I700 Atherosclerosis of aorta: Secondary | ICD-10-CM | POA: Diagnosis not present

## 2022-02-17 LAB — D-DIMER, QUANTITATIVE: D-Dimer, Quant: 3.57 mcg/mL FEU — ABNORMAL HIGH (ref <0.50)

## 2022-02-17 MED ORDER — IOHEXOL 350 MG/ML SOLN
75.0000 mL | Freq: Once | INTRAVENOUS | Status: AC | PRN
  Administered 2022-02-17: 75 mL via INTRAVENOUS

## 2022-02-17 NOTE — Telephone Encounter (Signed)
Waiting on venous ultrasound results.

## 2022-02-17 NOTE — Telephone Encounter (Signed)
[facility] - Client TELEPHONE ADVICE RECORD [facility] Patient Name: [name] Gender: Female DOB: 1928/04/13 Age: 89 Y 72 M 13 D Return Phone Number: [phone] (Primary) Address: City/ State/ Zip: [place] [address] Client [facility] - Client Client Site [facility] Provider Alma Friendly - NP Contact Type Call Who Is Calling Lab / Radiology Lab Name [facility] Phone Number [phone] Lab Tech Name [facility] Reference Number [id number] y Chief Complaint Lab Result (Critical or Stat) Call Type Lab Send to RN Reason for Call Report lab results Initial Comment Caller states they are April from [place] with critical results. Translation No Nurse Assessment Nurse: Alford Highland, RN, Magda Paganini Date/Time Eilene Ghazi Time): 02/17/2022 5:26:10 AM Is there an on-call provider listed? ---Yes Please list name of person reporting value (Lab Employee) and a contact number. ---Shirlean Mylar [phone] Please document the following items: Lab name Lab value (read back to lab to verify) Reference range for lab value Date and time blood was drawn Collect time of birth for bilirubin results ---Lab: [facility] Result: 3.57 Ref Range: <0.50 Collected on 02/16/2022 1322 Previous: none Dr who ordered: NP Alma Friendly Please collect the patient contact information from the lab. (name, phone number and address) ---[phone] Disp. Time Eilene Ghazi Time) Disposition Final User 02/17/2022 5:31:57 AM Called On-Call Provider Alford Highland, RN, Magda Paganini 02/17/2022 5:32:22 AM Clinical Call Yes Alford Highland RN, Magda Paganini Final Disposition 02/17/2022 5:32:22 AM Clinical Call Yes Alford Highland, RN, Abby Potash NOTE: All timestamps contained within this report are represented as [place] Standard Time. CONFIDENTIALTY NOTICE: This fax transmission is intended only for the addressee. It contains information that is legally privileged,  confidential or otherwise protected from use or disclosure. If you are not the intended recipient, you are strictly prohibited from reviewing, disclosing, copying using or disseminating any of this information or taking any action in reliance on or regarding this information. If you have received this fax in error, please notify us immediately by telephone so that we can arrange for its return to [place]. Phone: [phone], Toll-Free: [phone], Fax: [phone] Page: 2 of 2 Call Id: [id number] Paging DoctorName Phone DateTime Result/ Outcome Message Type Notes Walker Kehr MD [phone] 02/17/2022 5:31:57 AM Called On Call Provider - Reached Doctor Paged Walker Kehr - MD 02/17/2022 5:32:11 AM Spoke with On Call - General Message Result Spoke to on call, relayed critical lab results. no further orders.   Sending note to Gentry Fitz NP, [facility] pool and will take copy of access note to Gentry Fitz NP area.

## 2022-02-18 ENCOUNTER — Telehealth: Payer: Self-pay

## 2022-02-18 ENCOUNTER — Other Ambulatory Visit: Payer: Self-pay | Admitting: Primary Care

## 2022-02-18 DIAGNOSIS — M7989 Other specified soft tissue disorders: Secondary | ICD-10-CM

## 2022-02-18 DIAGNOSIS — R0609 Other forms of dyspnea: Secondary | ICD-10-CM

## 2022-02-18 MED ORDER — FUROSEMIDE 20 MG PO TABS: 20.0000 mg | ORAL_TABLET | Freq: Every day | ORAL | 0 refills | Status: DC

## 2022-02-18 NOTE — Telephone Encounter (Signed)
Patients daughter called back in and wanted to know if there was anything else they can do in the meantime for her shortness of breath. She states the patient cannot even go to the bathroom and back with out getting completely winded.   Advised of result note again, and patients daughter voiced understanding. She will contact pharmacy to pickup.

## 2022-02-18 NOTE — Telephone Encounter (Signed)
Yes, the furosemide pill should help with her shortness of breath if it is caused by fluid overload.

## 2022-02-18 NOTE — Progress Notes (Signed)
Care Management & Coordination Services Pharmacy Team  Reason for Encounter: Appointment Reminder  Contacted patient to confirm telephone appointment with Charlene Brooke, PharmD on 02/24/2022 at 8:45.  Spoke with family on 02/19/2022   Do you have any problems getting your medications? No  What is your top health concern you would like to discuss at your upcoming visit? No concerns  Have you seen any other providers since your last visit with PCP? No  Star Rating Drugs:  Medication:  Last Fill: Day Supply No Star Rating Drugs  Care Gaps: Annual wellness visit in last year? Yes 06/04/2022  Charlene Brooke, PharmD notified  Marijean Niemann, [place] Pharmacy Assistant [phone]

## 2022-02-18 NOTE — Telephone Encounter (Addendum)
Called patients daughter and reviewed all information. She verbalized understanding. Will call if any further questions.

## 2022-02-19 DIAGNOSIS — R350 Frequency of micturition: Secondary | ICD-10-CM | POA: Diagnosis not present

## 2022-02-19 LAB — POC URINALSYSI DIPSTICK (AUTOMATED)
Bilirubin, UA: NEGATIVE
Blood, UA: NEGATIVE
Glucose, UA: NEGATIVE
Ketones, UA: NEGATIVE
Leukocytes, UA: NEGATIVE
Nitrite, UA: POSITIVE
Protein, UA: NEGATIVE
Spec Grav, UA: 1.015 (ref 1.010–1.025)
Urobilinogen, UA: 0.2 E.U./dL
pH, UA: 6 (ref 5.0–8.0)

## 2022-02-21 ENCOUNTER — Other Ambulatory Visit: Payer: Self-pay | Admitting: Primary Care

## 2022-02-21 DIAGNOSIS — N3000 Acute cystitis without hematuria: Secondary | ICD-10-CM

## 2022-02-21 LAB — URINE CULTURE
MICRO NUMBER:: 14545007
SPECIMEN QUALITY:: ADEQUATE

## 2022-02-21 MED ORDER — NITROFURANTOIN MONOHYD MACRO 100 MG PO CAPS: 100.0000 mg | ORAL_CAPSULE | Freq: Two times a day (BID) | ORAL | 0 refills | Status: AC

## 2022-02-23 ENCOUNTER — Ambulatory Visit (INDEPENDENT_AMBULATORY_CARE_PROVIDER_SITE_OTHER): Payer: Medicare Other | Admitting: Primary Care

## 2022-02-23 ENCOUNTER — Encounter: Payer: Self-pay | Admitting: Primary Care

## 2022-02-23 VITALS — BP 138/80 | HR 88 | Temp 98.6°F | Ht 60.0 in | Wt 123.2 lb

## 2022-02-23 DIAGNOSIS — H6120 Impacted cerumen, unspecified ear: Secondary | ICD-10-CM | POA: Insufficient documentation

## 2022-02-23 DIAGNOSIS — M7989 Other specified soft tissue disorders: Secondary | ICD-10-CM

## 2022-02-23 DIAGNOSIS — H6121 Impacted cerumen, right ear: Secondary | ICD-10-CM | POA: Diagnosis not present

## 2022-02-23 DIAGNOSIS — R0609 Other forms of dyspnea: Secondary | ICD-10-CM

## 2022-02-23 LAB — BASIC METABOLIC PANEL
BUN: 15 mg/dL (ref 6–23)
CO2: 31 mEq/L (ref 19–32)
Calcium: 9.5 mg/dL (ref 8.4–10.5)
Chloride: 99 mEq/L (ref 96–112)
Creatinine, Ser: 0.96 mg/dL (ref 0.40–1.20)
GFR: 51.71 mL/min — ABNORMAL LOW (ref >60.00)
Glucose, Bld: 94 mg/dL (ref 70–99)
Potassium: 3.7 mEq/L (ref 3.5–5.1)
Sodium: 137 mEq/L (ref 135–145)

## 2022-02-23 LAB — BRAIN NATRIURETIC PEPTIDE: Pro B Natriuretic peptide (BNP): 61 pg/mL (ref 0.0–100.0)

## 2022-02-23 NOTE — Assessment & Plan Note (Signed)
Right cerumen impaction identified on exam. Patient consented to irrigation of canals bilaterally.  Right canal irrigated. Patient tolerated well. TM's and canals post irrigation unremarkable.   Discussed home care instructions.

## 2022-02-23 NOTE — Patient Instructions (Signed)
Stop by the lab prior to leaving today. I will notify you of your results once received.   Check your weight daily at the same time.   Please update if you see a 2lb weight gain in 24 hours or 5 lb weight gain with 7 days.   Referral has been sent for your echocardiogram. You will get a phone call about the appointment.   It was a pleasure to see you today!

## 2022-02-23 NOTE — Assessment & Plan Note (Addendum)
In light of recent work up, CHF suspected to be cause. Other differentials include COPD, deconditioning  Will repeat echocardiogram, orders placed and pending.  Exam improved today.   Educated and written instructions provided to patient and daughter for monitoring daily weight at the same time and reporting weight gain of 2 lb within 24 hours or 5 lb within 7 days.  Repeat BNP pending. Add BMP give recent furosemide course. Consider PRN furosemide course.   Await Echocardiogram results and labs.  I evaluated patient, was consulted regarding treatment, and agree with assessment and plan per Tinnie Gens, RN, DNP student.   Allie Bossier, NP-C'

## 2022-02-23 NOTE — Progress Notes (Signed)
Subjective:    Patient ID: [name], female    DOB: 20-Jun-1928, 89 y.o.   MRN: [id number]  HPI  [name] is a very pleasant 89 y.o. female with a history of orthostatic hypotension, COPD, hypothyroidism, lower extremity edema, chronic right superficial venous thrombosis, cardiomegaly, chronic back pain who presents today for follow up of lower extremity edema and exertional dyspnea.  Her daughter joins [place] today.  She is also reporting ear fullness. Her daughter mentions decreased ability to hear.   She was last evaluated on 02/16/22 for ongoing lower extremity edema (right worse than left) and increased exertional dyspnea. She underwent lab work which revealed mildly elevated BNP and d-dimer. She underwent bilateral venous US which revealed right superficial venous thrombosis. She underwent CTA Chest which was negative for PE but showed cardiomegaly with mild pleural effusion. She was treated with furosemide 20 mg daily x 5 days. She is here for follow up today.  Hematology was consulted regarding superficial venous thrombosis, and given her frailty/falls risk/age, PCP and hematology agreed that coagulation treatment was not recommended. Urine culture returned positive for E coli so Macrobid course BID x 5 days was sent to pharmacy.   Since her last visit her exertional dyspnea has decreased some, she's lost 5 pounds according to home scales, but her lower extremity edema remains the same. She is compliant to her Macrobid BID for which she began yesterday. She has noticed left lower flank pain. She denies dysuria, increased frequency, foul odor.   BP Readings from Last 3 Encounters:  02/23/22 138/80  02/16/22 126/78  07/21/21 120/64   Wt Readings from Last 3 Encounters:  02/23/22 123 lb 4 oz (55.9 kg)  02/16/22 128 lb (58.1 kg)  07/21/21 101 lb (45.8 kg)      Review of Systems  Constitutional:  Negative for chills and fever.  Respiratory:  Positive for shortness of  breath.   Cardiovascular:  Positive for leg swelling. Negative for chest pain.  Gastrointestinal:  Negative for nausea.  Genitourinary:  Positive for flank pain. Negative for dysuria and frequency.         Past Medical History:  Diagnosis Date   Anxiety    Arthritis    "hands" (10/24/2012)   B12 deficiency    "get shots twice/month" (10/24/2012)   Bursitis    Depression    Diverticulitis    Exertional shortness of breath    Headache(784.0)    "weekly at least" (10/24/2012)   Hypothyroidism    Insomnia    Ovarian cancer ([place])    mass hooked to sm. intestine/bladder   Skin cancer of face    Syncope    when neck is up or turned to side   Syncope and collapse    "first time I ever I collapsed"; confirms h/o presyncopal events (10/24/2012)   UTI (lower urinary tract infection)     Social History   Socioeconomic History   Marital status: Widowed    Spouse name: Not on file   Number of children: Not on file   Years of education: Not on file   Highest education level: Not on file  Occupational History   Not on file  Tobacco Use   Smoking status: Never   Smokeless tobacco: Never  Vaping Use   Vaping Use: Never used  Substance and Sexual Activity   Alcohol use: No   Drug use: No   Sexual activity: Never  Other Topics Concern   Not on file  Social History Narrative   Not on file   Social Determinants of Health   Financial Resource Strain: Medium Risk (06/10/2021)   Overall Financial Resource Strain (CARDIA)    Difficulty of Paying Living Expenses: Somewhat hard  Food Insecurity: Food Insecurity Present (06/10/2021)   Hunger Vital Sign    Worried About Running Out of Food in the Last Year: Sometimes true    Ran Out of Food in the Last Year: Sometimes true  Transportation Needs: No Transportation Needs (06/10/2021)   PRAPARE - Hydrologist (Medical): No    Lack of Transportation (Non-Medical): No  Physical Activity: Insufficiently  Active (06/03/2021)   Exercise Vital Sign    Days of Exercise per Week: 3 days    Minutes of Exercise per Session: 10 min  Stress: Stress Concern Present (06/03/2021)   [place]    Feeling of Stress : To some extent  Social Connections: Moderately Isolated (06/03/2021)   Social Connection and Isolation Panel [NHANES]    Frequency of Communication with Friends and Family: More than three times a week    Frequency of Social Gatherings with Friends and Family: More than three times a week    Attends Religious Services: 1 to 4 times per year    Active Member of [employer] or Organizations: No    Attends Archivist Meetings: Never    Marital Status: Widowed  Intimate Partner Violence: Not At Risk (06/03/2021)   Humiliation, Afraid, Rape, and Kick questionnaire    Fear of Current or Ex-Partner: No    Emotionally Abused: No    Physically Abused: No    Sexually Abused: No    Past Surgical History:  Procedure Laterality Date   ABDOMINAL HYSTERECTOMY     CATARACT EXTRACTION W/ INTRAOCULAR LENS  IMPLANT, BILATERAL Bilateral 2000's   DILATION AND CURETTAGE OF UTERUS     several   FOOT SURGERY Right 1943-1944   4 surgeries   IR KYPHO LUMBAR INC FX REDUCE BONE BX UNI/BIL CANNULATION INC/IMAGING  10/22/2020   IR KYPHO THORACIC WITH BONE BIOPSY  07/27/2017   THYROIDECTOMY     TONSILLECTOMY      Family History  Problem Relation Age of Onset   Breast cancer Mother    Heart Problems Father    Heart attack Father     Allergies  Allergen Reactions   Penicillins Hives and Rash    Has patient had a PCN reaction causing immediate rash, facial/tongue/throat swelling, SOB or lightheadedness with hypotension: Yes Has patient had a PCN reaction causing severe rash involving mucus membranes or skin necrosis: Yes Has patient had a PCN reaction that required hospitalization No Has patient had a PCN reaction occurring within the  last 10 years: No If all of the above answers are "NO", then may proceed with Cephalosporin use.    Prednisone Swelling    Current Outpatient Medications on File Prior to Visit  Medication Sig Dispense Refill   acetaminophen (TYLENOL) 650 MG CR tablet Take 1,300 mg by mouth every 8 (eight) hours as needed for pain.     ADVAIR DISKUS 250-50 MCG/ACT AEPB INHALE 1 PUFF INTO THE LUNGS 2 TIMES DAILY. 60 each 5   albuterol (PROVENTIL) (2.5 MG/3ML) 0.083% nebulizer solution Take 3 mLs (2.5 mg total) by nebulization every 6 (six) hours as needed for wheezing or shortness of breath. 150 mL 0   alendronate (FOSAMAX) 70 MG tablet TAKE 1 TABLET BY  MOUTH ONCE A WEEK (ON MONDAYS). TAKE WITH A FULL GLASS OF WATER ON AN EMPTY STOMACH. 12 tablet 1   Cholecalciferol (VITAMIN D) 2000 units tablet Take 2,000 Units by mouth daily.     docusate sodium (COLACE) 100 MG capsule Take 100 mg by mouth daily.     DULoxetine (CYMBALTA) 30 MG capsule TAKE 1 CAPSULE BY MOUTH DAILY FOR ANXIETY AND DEPRESSION 90 capsule 2   ferrous sulfate 325 (65 FE) MG tablet Take 325 mg by mouth daily.     gabapentin (NEURONTIN) 100 MG capsule TAKE 1 CAPSULE BY MOUTH 3 TIMES DAILY FOR BACK PAIN. 270 capsule 1   mirtazapine (REMERON) 15 MG tablet TAKE 1/2 TO 1 TABLET BY MOUTH AT BEDTIME FOR SLEEP AND APPETITE. 90 tablet 3   nitrofurantoin, macrocrystal-monohydrate, (MACROBID) 100 MG capsule Take 1 capsule (100 mg total) by mouth 2 (two) times daily for 5 days. 10 capsule 0   SYNTHROID 100 MCG tablet TAKE 1 TABLET BY MOUTH ON EMPTY STOMACH WITH WATER ONLY ON SUNDAY THROUGH THURSDAY, NO FOOD OR OTHER MEDICINE FOR 30 MINUTES. 60 tablet 2   Cyanocobalamin (B-12) 5000 MCG CAPS Take 5,000 mcg by mouth daily. (Patient not taking: Reported on 02/23/2022)     furosemide (LASIX) 20 MG tablet Take 1 tablet (20 mg total) by mouth daily for 5 days. For fluid. (Patient not taking: Reported on 02/23/2022) 5 tablet 0   SYNTHROID 112 MCG tablet TAKE 1 TABLET BY  MOUTH EVERY MORNING ON EMPTY STOMACH WITH WATER ONLY ON FRIDAY AND SATURDAY, NO FOOD OR OTHER MEDICINES FOR 30 MINUTES. (Patient not taking: Reported on 02/16/2022) 24 tablet 2   No current facility-administered medications on file prior to visit.    BP 138/80 (BP Location: Left Arm, Patient Position: Sitting)   Pulse 88   Temp 98.6 F (37 C) (Oral)   Ht 5' (1.524 m)   Wt 123 lb 4 oz (55.9 kg)   SpO2 96%   BMI 24.07 kg/m  Objective:   Physical Exam HENT:     Right Ear: There is impacted cerumen.  Cardiovascular:     Rate and Rhythm: Normal rate and regular rhythm.     Comments: Bilateral lower extremity edema reduced.  No pitting. Pulmonary:     Effort: Pulmonary effort is normal.     Breath sounds: Normal breath sounds.  Musculoskeletal:     Cervical back: Neck supple.  Skin:    General: Skin is warm and dry.           Assessment & Plan:  Dyspnea on exertion Assessment & Plan: In light of recent work up, CHF suspected to be cause. Other differentials include COPD, deconditioning  Will repeat echocardiogram, orders placed and pending.  Exam improved today.   Educated and written instructions provided to patient and daughter for monitoring daily weight at the same time and reporting weight gain of 2 lb within 24 hours or 5 lb within 7 days.  Repeat BNP pending. Add BMP give recent furosemide course. Consider PRN furosemide course.   Await Echocardiogram results and labs.  I evaluated patient, was consulted regarding treatment, and agree with assessment and plan per Tinnie Gens, RN, DNP student.   Allie Bossier, NP-C'  Orders: -     ECHOCARDIOGRAM COMPLETE; Future -     Basic metabolic panel -     Brain natriuretic peptide  Swelling of lower extremity Assessment & Plan: Improved.  Long discussion with patient and daughter regarding risks vs  benefits for treatment of superficial clot in her right leg. Given her frailty and age, she/her daughter, and I agree  to defer anticoagulant therapy.  Consulted with hematology who agrees with deferring anticoagulation.   Continue aspirin.  Repeat BNP and BMP pending.   Discussed with patient and daughter the importance to continue to elevate her legs throughout the day. Consider PRN furosemide.  I evaluated patient, was consulted regarding treatment, and agree with assessment and plan per Tinnie Gens, RN, DNP student.   Allie Bossier, NP-C    Impacted cerumen of right ear Assessment & Plan: Right cerumen impaction identified on exam. Patient consented to irrigation of canals bilaterally.  Right canal irrigated. Patient tolerated well. TM's and canals post irrigation unremarkable.   Discussed home care instructions.           Pleas Koch, NP

## 2022-02-23 NOTE — Assessment & Plan Note (Addendum)
Improved.  Long discussion with patient and daughter regarding risks vs benefits for treatment of superficial clot in her right leg. Given her frailty and age, she/her daughter, and I agree to defer anticoagulant therapy.  Consulted with hematology who agrees with deferring anticoagulation.   Continue aspirin.  Repeat BNP and BMP pending.   Discussed with patient and daughter the importance to continue to elevate her legs throughout the day. Consider PRN furosemide.  I evaluated patient, was consulted regarding treatment, and agree with assessment and plan per Tinnie Gens, RN, DNP student.   Allie Bossier, NP-C

## 2022-02-23 NOTE — Progress Notes (Signed)
Established Patient Office Visit  Subjective   Patient ID: [name], female    DOB: [birth year]  Age: 89 y.o. MRN: [id number]  Chief Complaint  Patient presents with   Follow-up    Had blood clot in leg   Hearing Problem    See if ears are clogged up    HPI  [name] is a 89 year old female with past medical history of COPD, dyspnea, Anemia, hypothyroidism, anxiety and depression presents today for a follow up.   She was evaluated on 02/16/22 for dyspnea on exertion. Labs were ordered and she was found to have her D-dimer and BNP were elevated. She also had a Urine Culture which revealed E.coli. She had a STAT vascular ultrasound completed of her lower extremities. She was found to have a superficial clot in her right lower extremity. Her chest xray was negative. CTA was ordered and revealed no evidence of pulmonary embolus, cardiomegaly without pericardial effusion and aortic atherosclerosis.   She was prescribed a Furosemide 20 mg for five days. She was also started on Nitrofurantoin 100 mg BID for five days.   Today, she is here with her daughter. Reports feeling better overall. Her dyspnea with exertion has improved some but still gets short-winded really easily. She completed a five day course of lasix and her daughter reports that the patient has lost five lbs. She reports that swelling is about the same. She has been elevating her legs more. She has not been wearing compression socks. She denies any leg pain or calf pain.   2) Acute cystitis with E.coli: She just started the Nitrofurantoin yesterday. She reports left sided flank pain. She denies any urinary burning, odor, hematuria or frequency.   Patient Active Problem List   Diagnosis Date Noted   Dyspnea on exertion 02/16/2022   Swelling of lower extremity 01/13/2022   Anemia 12/11/2020   Acute pelvic pain 08/08/2020   Chronic back pain 08/08/2020   Acute pain of right wrist 08/08/2020   Acute right-sided thoracic  back pain 12/25/2019   Osteoporosis with current pathological fracture 03/27/2019   Decreased appetite 03/27/2019   Insomnia 09/11/2018   Urinary frequency 08/30/2018   Cough 05/01/2018   Urinary incontinence 03/21/2018   Irritation of external ear canal 09/28/2017   Compression fracture of body of thoracic vertebra (HCC) 09/14/2017   Anxiety and depression 09/14/2017   Syncope and collapse 10/24/2012   Orthostatic hypotension 10/24/2012   Other fatigue 01/21/2011   Dizziness 01/21/2011   COPD (chronic obstructive pulmonary disease) ([place]) 01/21/2011   Hypothyroidism 03/28/2008   DYSPNEA 03/28/2008   Past Medical History:  Diagnosis Date   Anxiety    Arthritis    "hands" (10/24/2012)   B12 deficiency    "get shots twice/month" (10/24/2012)   Bursitis    Depression    Diverticulitis    Exertional shortness of breath    Headache(784.0)    "weekly at least" (10/24/2012)   Hypothyroidism    Insomnia    Ovarian cancer ([place])    mass hooked to sm. intestine/bladder   Skin cancer of face    Syncope    when neck is up or turned to side   Syncope and collapse    "first time I ever I collapsed"; confirms h/o presyncopal events (10/24/2012)   UTI (lower urinary tract infection)    Past Surgical History:  Procedure Laterality Date   ABDOMINAL HYSTERECTOMY     CATARACT EXTRACTION W/ INTRAOCULAR LENS  IMPLANT, BILATERAL  Bilateral 2000's   DILATION AND CURETTAGE OF UTERUS     several   FOOT SURGERY Right 1943-1944   4 surgeries   IR KYPHO LUMBAR INC FX REDUCE BONE BX UNI/BIL CANNULATION INC/IMAGING  10/22/2020   IR KYPHO THORACIC WITH BONE BIOPSY  07/27/2017   THYROIDECTOMY     TONSILLECTOMY     Social History   Tobacco Use   Smoking status: Never   Smokeless tobacco: Never  Vaping Use   Vaping Use: Never used  Substance Use Topics   Alcohol use: No   Drug use: No   Allergies  Allergen Reactions   Penicillins Hives and Rash    Has patient had a PCN reaction causing  immediate rash, facial/tongue/throat swelling, SOB or lightheadedness with hypotension: Yes Has patient had a PCN reaction causing severe rash involving mucus membranes or skin necrosis: Yes Has patient had a PCN reaction that required hospitalization No Has patient had a PCN reaction occurring within the last 10 years: No If all of the above answers are "NO", then may proceed with Cephalosporin use.    Prednisone Swelling      Review of Systems  Constitutional:  Negative for chills and fever.  Respiratory:  Positive for shortness of breath.   Cardiovascular:  Negative for chest pain, palpitations and leg swelling.  Gastrointestinal:  Negative for nausea and vomiting.  Genitourinary:  Positive for flank pain and frequency. Negative for dysuria and hematuria.  Neurological:  Negative for dizziness and headaches.      Objective:     BP 138/80 (BP Location: Left Arm, Patient Position: Sitting)   Pulse 88   Temp 98.6 F (37 C) (Oral)   Ht 5' (1.524 m)   Wt 123 lb 4 oz (55.9 kg)   SpO2 96%   BMI 24.07 kg/m  BP Readings from Last 3 Encounters:  02/23/22 138/80  02/16/22 126/78  07/21/21 120/64   Wt Readings from Last 3 Encounters:  02/23/22 123 lb 4 oz (55.9 kg)  02/16/22 128 lb (58.1 kg)  07/21/21 101 lb (45.8 kg)      Physical Exam Vitals and nursing note reviewed.  Constitutional:      Appearance: Normal appearance.  Cardiovascular:     Rate and Rhythm: Normal rate and regular rhythm.  Pulmonary:     Effort: Pulmonary effort is normal.     Breath sounds: Normal breath sounds.  Abdominal:     Tenderness: There is no right CVA tenderness or left CVA tenderness.  Neurological:     Mental Status: She is alert and oriented to person, place, and time.  Psychiatric:        Mood and Affect: Mood normal.        Behavior: Behavior normal.      No results found for any visits on 02/23/22.     The ASCVD Risk score (Arnett DK, et al., 2019) failed to calculate for  the following reasons:   The 2019 ASCVD risk score is only valid for ages 39 to 76    Assessment & Plan:   Problem List Items Addressed This Visit       Other   Swelling of lower extremity    Controlled.   Long discussion with patient and daughter regarding risks vs benefits for treatment of superficial clot in her right leg. At this time, they do not want to pursue further. They will continue to monitor.  Repeat BNP and BMP pending.   Discussed with patient and daughter  the importance to continue to elevate her legs throughout the day.      Dyspnea on exertion - Primary    Unclear etiology, differentials include CHF, COPD, deconditioning  Will repeat echocardiogram   Exam stable today.   Educated and written instructions provided to patient and daughter for monitoring daily weight at the same time and reporting weight gain of 2 lb within 24 hours or 5 lb within 7 days.  Repeat BNP pending.  Await Echocardiogram results.       Relevant Orders   ECHOCARDIOGRAM COMPLETE   Basic Metabolic Panel   Brain natriuretic peptide    No follow-ups on file.    Tinnie Gens, BSN-RN, DNP STUDENT

## 2022-02-24 ENCOUNTER — Other Ambulatory Visit: Payer: Self-pay | Admitting: Primary Care

## 2022-02-24 ENCOUNTER — Ambulatory Visit: Payer: Medicare Other | Admitting: Pharmacist

## 2022-02-24 DIAGNOSIS — M545 Low back pain, unspecified: Secondary | ICD-10-CM | POA: Diagnosis not present

## 2022-02-24 DIAGNOSIS — G8929 Other chronic pain: Secondary | ICD-10-CM | POA: Diagnosis not present

## 2022-02-24 DIAGNOSIS — J439 Emphysema, unspecified: Secondary | ICD-10-CM | POA: Diagnosis not present

## 2022-02-24 DIAGNOSIS — I872 Venous insufficiency (chronic) (peripheral): Secondary | ICD-10-CM | POA: Diagnosis not present

## 2022-02-24 DIAGNOSIS — R0609 Other forms of dyspnea: Secondary | ICD-10-CM

## 2022-02-24 DIAGNOSIS — R600 Localized edema: Secondary | ICD-10-CM | POA: Diagnosis not present

## 2022-02-24 DIAGNOSIS — R5383 Other fatigue: Secondary | ICD-10-CM | POA: Diagnosis not present

## 2022-02-24 DIAGNOSIS — M7989 Other specified soft tissue disorders: Secondary | ICD-10-CM

## 2022-02-24 MED ORDER — FUROSEMIDE 20 MG PO TABS: 20.0000 mg | ORAL_TABLET | Freq: Every day | ORAL | 0 refills | Status: DC | PRN

## 2022-02-24 NOTE — Patient Instructions (Signed)
Visit Information  Phone number for Pharmacist: [phone]  Thank you for meeting with me to discuss your medications! Below is a summary of what we talked about during the visit:   Recommendations/Changes made from today's visit: -Advised to resume precribed Synthroid regimen; re-check TSH at next [place] to use Advair twice daily  Follow up plan: -Health Concierge will call patient 3 months for general update -Pharmacist follow up televisit PRN   Charlene Brooke, PharmD, BCACP Clinical Pharmacist [facility] [phone]

## 2022-02-24 NOTE — Progress Notes (Signed)
Care Management & Coordination Services Pharmacy Note  02/24/2022 Name:  [name] MRN:  [id number] DOB:  Feb 23, 1928  Summary: F/U visit -Pt stopped taking 112 mcg dose of Synthroid 2 weeks ago due to pt "feeling down"; discussed hypothyroidism and role of Synthroid; reviewed last TSH from 07/2021 was 2.8 on the prescribed regimen -Asthma/COPD - pt c/o of DOE, she is using Advair only PRN; while there are multiple etiologies that may be contributing to DOE (possible CHF, deconditioning, etc), she may benefit from more consistent use of maintenance inhaler  Recommendations/Changes made from today's visit: -Advised to resume precribed Synthroid regimen; re-check TSH at next OV -Advised to use Advair twice daily  Follow up plan: -Health Concierge will call patient 3 months for general update -Pharmacist follow up televisit PRN    Subjective: [name] is an 89 y.o. year old female who is a primary patient of Pleas Koch, NP.  The care coordination team was consulted for assistance with disease management and care coordination needs.    Engaged with patient by telephone for follow up visit.  Recent office visits: 02/23/22 NP Allie Bossier OV: DOE f/u, s/p 5 days of furosemide and macrobid. Lost 5 lbs. Vas [place] superficial clot in R leg - opted no treatment. Repeat ECHO.   02/16/22 NP Allie Bossier OV: DOE, swelling. Ddx CHF, PE, DVT. Ordered Vas [place], BMP, BMP, D-dimer.  Positive UTI, rx macrobid. Rx furosemide x 5 days.  01/13/22 NP Allie Bossier VV: fatigue, swelling, urinary frequency.. Elevate legs. Avoid caffeine after 12 pm. Limit liquids and sugar at HS.   Recent consult visits: None  Hospital visits: None in previous 6 months   Objective:  Lab Results  Component Value Date   CREATININE 0.96 02/23/2022   BUN 15 02/23/2022   GFR 51.71 (L) 02/23/2022   EGFR 67 12/11/2020   GFRNONAA >60 10/22/2020   GFRAA >60 09/06/2018   NA 137 02/23/2022   K 3.7 02/23/2022   CALCIUM  9.5 02/23/2022   CO2 31 02/23/2022   GLUCOSE 94 02/23/2022    Lab Results  Component Value Date/Time   HGBA1C 5.2 12/11/2020 02:13 PM   HGBA1C 5.8 (H) 01/22/2011 12:13 AM   GFR 51.71 (L) 02/23/2022 12:47 PM   GFR 60.70 02/16/2022 01:22 PM    Last diabetic Eye exam: No results found for: "HMDIABEYEEXA"  Last diabetic Foot exam: No results found for: "HMDIABFOOTEX"   Lab Results  Component Value Date   CHOL 188 03/27/2019   HDL 57.30 03/27/2019   LDLCALC 121 (H) 03/27/2019   TRIG 50.0 03/27/2019   CHOLHDL 3 03/27/2019       Latest Ref Rng & Units 12/11/2020    2:13 PM 03/27/2019    4:16 PM 03/15/2018    9:46 AM  Hepatic Function  Total Protein 6.0 - 8.5 g/dL 6.7  6.9  7.2   Albumin 3.5 - 4.6 g/dL 4.5  4.1  4.4   AST 0 - 40 IU/L 14  13  15   $ ALT 0 - 32 IU/L 7  7  8   $ Alk Phosphatase 44 - 121 IU/L 53  35  42   Total Bilirubin 0.0 - 1.2 mg/dL 0.3  0.3  0.6     Lab Results  Component Value Date/Time   TSH 2.80 07/21/2021 03:15 PM   TSH 2.940 12/11/2020 02:13 PM   FREET4 1.51 11/07/2010 07:00 AM       Latest Ref Rng & Units 12/11/2020  2:13 PM 10/22/2020   10:16 AM 12/25/2019   12:00 PM  CBC  WBC 3.4 - 10.8 x10E3/uL 4.9  4.7  4.7   Hemoglobin 11.1 - 15.9 g/dL 12.0  12.0  11.5   Hematocrit 34.0 - 46.6 % 36.4  38.0  34.8   Platelets 150 - 450 x10E3/uL 202  202  233.0     Lab Results  Component Value Date/Time   VD25OH 44.59 07/21/2021 03:15 PM   VITAMINB12 1,240 (H) 07/21/2021 03:15 PM    Clinical ASCVD: No  The ASCVD Risk score (Arnett DK, et al., 2019) failed to calculate for the following reasons:   The 2019 ASCVD risk score is only valid for ages 36 to 49        02/23/2022   11:34 AM 02/16/2022   12:37 PM 06/03/2021   12:15 PM  Depression screen PHQ 2/9  Decreased Interest 1 2 0  Down, Depressed, Hopeless  2 1  PHQ - 2 Score 1 4 1  $ Altered sleeping 0 0   Tired, decreased energy 3 3   Change in appetite 1 2   Feeling bad or failure about yourself  1  0   Trouble concentrating 2 0   Moving slowly or fidgety/restless 3 0   Suicidal thoughts 0 0   PHQ-9 Score 11 9   Difficult doing work/chores Somewhat difficult Somewhat difficult        02/23/2022   11:36 AM 02/16/2022   12:38 PM 03/21/2018    3:36 PM 11/08/2017    3:25 PM  GAD 7 : Generalized Anxiety Score  Nervous, Anxious, on Edge 2 2 3 3  $ Control/stop worrying 1 1 1 3  $ Worry too much - different things 1 1 3 3  $ Trouble relaxing 1 1 1 1  $ Restless 0 0 1 1  Easily annoyed or irritable 0 0 3 2  Afraid - awful might happen 0 0 2 1  Total GAD 7 Score 5 5 14 14  $ Anxiety Difficulty Somewhat difficult Somewhat difficult Somewhat difficult Somewhat difficult     Social History   Tobacco Use  Smoking Status Never  Smokeless Tobacco Never   BP Readings from Last 3 Encounters:  02/23/22 138/80  02/16/22 126/78  07/21/21 120/64   Pulse Readings from Last 3 Encounters:  02/23/22 88  02/16/22 68  07/21/21 76   Wt Readings from Last 3 Encounters:  02/23/22 123 lb 4 oz (55.9 kg)  02/16/22 128 lb (58.1 kg)  07/21/21 101 lb (45.8 kg)   BMI Readings from Last 3 Encounters:  02/23/22 24.07 kg/m  02/16/22 25.00 kg/m  07/21/21 19.73 kg/m    Allergies  Allergen Reactions   Penicillins Hives and Rash    Has patient had a PCN reaction causing immediate rash, facial/tongue/throat swelling, SOB or lightheadedness with hypotension: Yes Has patient had a PCN reaction causing severe rash involving mucus membranes or skin necrosis: Yes Has patient had a PCN reaction that required hospitalization No Has patient had a PCN reaction occurring within the last 10 years: No If all of the above answers are "NO", then may proceed with Cephalosporin use.    Prednisone Swelling    Medications Reviewed Today     Reviewed by Charlton Haws, [facility] (Pharmacist) on 02/24/22 at 1415  Med List Status: <None>   Medication Order Taking? Sig Documenting Provider Last Dose Status Informant   acetaminophen (TYLENOL) 650 MG CR tablet [id number] Yes Take 1,300 mg by mouth every 8 (eight)  hours as needed for pain. [provider] Taking Active Family Member  ADVAIR DISKUS 250-50 MCG/ACT AEPB [id number] Yes INHALE 1 PUFF INTO THE LUNGS 2 TIMES DAILY. Pleas Koch, NP Taking Active   albuterol (PROVENTIL) (2.5 MG/3ML) 0.083% nebulizer solution [id number] Yes Take 3 mLs (2.5 mg total) by nebulization every 6 (six) hours as needed for wheezing or shortness of breath. Pleas Koch, NP Taking Active Family Member  alendronate (FOSAMAX) 70 MG tablet [id number] Yes TAKE 1 TABLET BY MOUTH ONCE A WEEK (ON MONDAYS). TAKE WITH A FULL GLASS OF WATER ON AN EMPTY STOMACH. Pleas Koch, NP Taking Active   Cholecalciferol (VITAMIN D) 2000 units tablet [id number] Yes Take 2,000 Units by mouth daily. [provider] Taking Active Family Member  Cyanocobalamin (B-12) 5000 MCG CAPS [id number] Yes Take 5,000 mcg by mouth daily. [provider] Taking Active Family Member  docusate sodium (COLACE) 100 MG capsule [id number] Yes Take 100 mg by mouth daily. [provider] Taking Active Family Member  DULoxetine (CYMBALTA) 30 MG capsule [id number] Yes TAKE 1 CAPSULE BY MOUTH DAILY FOR ANXIETY AND DEPRESSION Pleas Koch, NP Taking Active   ferrous sulfate 325 (65 FE) MG tablet [id number] Yes Take 325 mg by mouth daily. [provider] Taking Active Family Member  furosemide (LASIX) 20 MG tablet [id number]  Take 1 tablet (20 mg total) by mouth daily as needed for fluid or edema. Pleas Koch, NP  Active   gabapentin (NEURONTIN) 100 MG capsule [id number] Yes TAKE 1 CAPSULE BY MOUTH 3 TIMES DAILY FOR BACK PAIN. Pleas Koch, NP Taking Active   mirtazapine (REMERON) 15 MG tablet [id number] Yes TAKE 1/2 TO 1 TABLET BY MOUTH AT BEDTIME FOR SLEEP AND APPETITE. Pleas Koch, NP Taking Active   nitrofurantoin, macrocrystal-monohydrate, (MACROBID) 100 MG  capsule [id number] Yes Take 1 capsule (100 mg total) by mouth 2 (two) times daily for 5 days. Pleas Koch, NP Taking Active   SYNTHROID 100 MCG tablet [id number] Yes TAKE 1 TABLET BY MOUTH ON EMPTY STOMACH WITH WATER ONLY ON SUNDAY THROUGH THURSDAY, NO FOOD OR OTHER MEDICINE FOR 30 MINUTES. Pleas Koch, NP Taking Active   SYNTHROID 112 MCG tablet [id number] No TAKE 1 TABLET BY MOUTH EVERY MORNING ON EMPTY STOMACH WITH WATER ONLY ON FRIDAY AND SATURDAY, NO FOOD OR OTHER MEDICINES FOR 30 MINUTES.  Patient not taking: Reported on 02/24/2022   Pleas Koch, NP Not Taking Active             SDOH:  (Social Determinants of Health) assessments and interventions performed: No SDOH Interventions    Flowsheet Row Office Visit from 02/16/2022 in [place] at [place] Telephone from 06/10/2021 in [place] from 06/03/2021 in [place] at [place] Management from 02/23/2021 in [place] at [place] from 05/30/2020 in [place] at [place] from 04/26/2019 in [place] at [place] Interventions -- Assist with SNAP Application [id number] Referral -- -- --  Housing Interventions -- -- Intervention Not Indicated Intervention Not Indicated -- --  Transportation Interventions -- Intervention Not Indicated Intervention Not Indicated -- -- --  Depression Interventions/Treatment  Counseling -- -- -- PHQ2-9 Score <4 Follow-up Not Indicated PHQ2-9 Score <4 Follow-up Not Indicated  Financial Strain Interventions -- --  [id number] Referral Intervention Not Indicated -- --  Physical Activity Interventions -- -- Other (Comments)  [encouraged movement as tolerated.] -- -- --  Stress Interventions -- -- Other (Comment)  [medication.] -- -- --   Social Connections Interventions -- -- Other (Comment)  [Sees family daily.] -- -- --       Medication Assistance: None required.  Patient affirms current coverage meets needs.  Medication Access: Within the past 30 days, how often has patient missed a dose of medication? Synthroid 100 Is a pillbox or other method used to improve adherence? Yes  Factors that may affect medication adherence? lack of understanding of disease management Are meds synced by current pharmacy? No  Are meds delivered by current pharmacy? No  Does patient experience delays in picking up medications due to transportation concerns? No   Upstream Services Reviewed: Is patient disadvantaged to use [facility] Pharmacy?: Yes  Current Rx insurance plan: [employer] [place] Name and location of Current pharmacy:  [place], [place] [address] Phone: [phone] Fax: [phone]  [facility] Pharmacy services reviewed with patient today?: No  Patient requests to transfer care to Upstream Pharmacy?: No  Reason patient declined to change pharmacies: Disadvantaged due to insurance/mail order  Compliance/Adherence/Medication fill history: Care Gaps: None  Star-Rating Drugs: none   ASSESSMENT / PLAN  COPD (Goal: control symptoms and prevent exacerbations) -Query controlled - pt reports fatigue, shortness of breath; she has been seen by PCP for this in past 2 weeks, etiology is multifactorial but asthma/COPD may be contributing; she is not taking Advair BID, just a few times a week -Pulmonology consulted 04/2019, no medication changed. Suspected asthma. Query cardiac cause of dyspnea. Referred to cardiology. Follow up PRN. -Pulmonary function testing: no formal PFTs completed -Exacerbations requiring treatment in last 6 months: none -Current treatment  Advair Diskus 250-50 mcg/dose 1 puff BID Appropriate, Effective, Safe,  Accessible Albuterol nebulizer PRN -Medications previously tried: none reported  -Patient denies consistent use of maintenance inhaler -Counseled on Benefits of consistent maintenance inhaler use; advised to use Advair BID and monitor dyspnea  Depression/Anxiety (Goal: Improve mood/sleep) -Controlled, mood is stable per family report -Current treatment: Mirtazapine 15 mg - 1/2 to 1 tab HS - Appropriate, Effective, Safe, Accessible Duloxetine 30 mg daily - Appropriate, Effective, Safe, Accessible -Medications previously tried/failed: none -Recommended to continue current medication  Osteoporosis (Goal Improve bone density, prevent fractures) -Controlled - DEXA due, but pt feels too weak to pursue this, PCP deferred scan. -No recent falls. She uses walker at all times. -Last DEXA Scan: April 2021   T-Score femoral neck: -3.0 -Patient is a candidate for pharmacologic treatment due to T-Score < -2.5 in femoral neck -Current treatment  Alendronate 70 mg weekly (Mon) - Appropriate, Effective, Safe, Accessible Vitamin D3 2000 IU daily - Appropriate, Effective, Safe, Accessible -Medications previously tried: none   -Recommended to continue current medication  Hypothyroidism (Goal: TSH, T4 WNL) -Controlled - TSH 2.8 (07/2021) at goal -Pt has been taking synthroid 100 mcg daily, not taking 112 mcg dose for about 2 weeks now. Reviewed hypothyroidsm and role of synthroid, as well as long duration of action so dose change takes 6-8 weeks for effect -Current treatment  Synthroid 100 mcg Sunday-Thursday Synthroid 112 mcg Friday-Saturday -Medications previously tried: none  -Recommended to continue current medication as prescribed; pt agreed to resume 112 mcg dose Fri-Sat  Health Maintenance -Vaccine gaps: Shingrix, TDAP    Mendel Ryder  Lenord Fellers, PharmD, [place] Clinical Pharmacist [facility] at [facility] [phone]

## 2022-03-02 DIAGNOSIS — J439 Emphysema, unspecified: Secondary | ICD-10-CM | POA: Diagnosis not present

## 2022-03-02 DIAGNOSIS — M545 Low back pain, unspecified: Secondary | ICD-10-CM | POA: Diagnosis not present

## 2022-03-02 DIAGNOSIS — I872 Venous insufficiency (chronic) (peripheral): Secondary | ICD-10-CM | POA: Diagnosis not present

## 2022-03-02 DIAGNOSIS — R5383 Other fatigue: Secondary | ICD-10-CM | POA: Diagnosis not present

## 2022-03-02 DIAGNOSIS — R600 Localized edema: Secondary | ICD-10-CM | POA: Diagnosis not present

## 2022-03-02 DIAGNOSIS — G8929 Other chronic pain: Secondary | ICD-10-CM | POA: Diagnosis not present

## 2022-03-08 ENCOUNTER — Ambulatory Visit (INDEPENDENT_AMBULATORY_CARE_PROVIDER_SITE_OTHER): Payer: Medicare Other | Admitting: Family Medicine

## 2022-03-08 ENCOUNTER — Telehealth: Payer: Self-pay

## 2022-03-08 ENCOUNTER — Encounter: Payer: Self-pay | Admitting: Family Medicine

## 2022-03-08 VITALS — BP 100/64 | HR 74 | Temp 98.2°F | Ht 60.0 in | Wt 126.5 lb

## 2022-03-08 DIAGNOSIS — R7989 Other specified abnormal findings of blood chemistry: Secondary | ICD-10-CM | POA: Diagnosis not present

## 2022-03-08 DIAGNOSIS — H538 Other visual disturbances: Secondary | ICD-10-CM | POA: Diagnosis not present

## 2022-03-08 DIAGNOSIS — M542 Cervicalgia: Secondary | ICD-10-CM

## 2022-03-08 DIAGNOSIS — R0609 Other forms of dyspnea: Secondary | ICD-10-CM | POA: Diagnosis not present

## 2022-03-08 DIAGNOSIS — M7989 Other specified soft tissue disorders: Secondary | ICD-10-CM

## 2022-03-08 LAB — SEDIMENTATION RATE: Sed Rate: 36 mm/hr — ABNORMAL HIGH (ref 0–30)

## 2022-03-08 LAB — BASIC METABOLIC PANEL
BUN: 13 mg/dL (ref 6–23)
CO2: 30 mEq/L (ref 19–32)
Calcium: 9.8 mg/dL (ref 8.4–10.5)
Chloride: 101 mEq/L (ref 96–112)
Creatinine, Ser: 0.9 mg/dL (ref 0.40–1.20)
GFR: 55.86 mL/min — ABNORMAL LOW (ref >60.00)
Glucose, Bld: 75 mg/dL (ref 70–99)
Potassium: 4.6 mEq/L (ref 3.5–5.1)
Sodium: 138 mEq/L (ref 135–145)

## 2022-03-08 LAB — HEPATIC FUNCTION PANEL
ALT: 9 U/L (ref 0–35)
AST: 16 U/L (ref 0–37)
Albumin: 3.7 g/dL (ref 3.5–5.2)
Alkaline Phosphatase: 62 U/L (ref 39–117)
Bilirubin, Direct: 0.1 mg/dL (ref 0.0–0.3)
Total Bilirubin: 0.5 mg/dL (ref 0.2–1.2)
Total Protein: 6.9 g/dL (ref 6.0–8.3)

## 2022-03-08 LAB — CBC WITH DIFFERENTIAL/PLATELET
Basophils Absolute: 0 10*3/uL (ref 0.0–0.1)
Basophils Relative: 0.7 % (ref 0.0–3.0)
Eosinophils Absolute: 0.3 10*3/uL (ref 0.0–0.7)
Eosinophils Relative: 6.1 % — ABNORMAL HIGH (ref 0.0–5.0)
HCT: 35.1 % — ABNORMAL LOW (ref 36.0–46.0)
Hemoglobin: 11.9 g/dL — ABNORMAL LOW (ref 12.0–15.0)
Lymphocytes Relative: 33 % (ref 12.0–46.0)
Lymphs Abs: 1.4 10*3/uL (ref 0.7–4.0)
MCHC: 33.8 g/dL (ref 30.0–36.0)
MCV: 90.3 fl (ref 78.0–100.0)
Monocytes Absolute: 0.5 10*3/uL (ref 0.1–1.0)
Monocytes Relative: 10.5 % (ref 3.0–12.0)
Neutro Abs: 2.2 10*3/uL (ref 1.4–7.7)
Neutrophils Relative %: 49.7 % (ref 43.0–77.0)
Platelets: 212 10*3/uL (ref 150.0–400.0)
RBC: 3.88 Mil/uL (ref 3.87–5.11)
RDW: 13.6 % (ref 11.5–15.5)
WBC: 4.4 10*3/uL (ref 4.0–10.5)

## 2022-03-08 LAB — BRAIN NATRIURETIC PEPTIDE: Pro B Natriuretic peptide (BNP): 71 pg/mL (ref 0.0–100.0)

## 2022-03-08 NOTE — Progress Notes (Unsigned)
    [name] T. Quindarrius Joplin, MD, [place] at [facility] [place], [address]  Phone: [phone]  FAX: [place] - 89 y.o. female  MRN [id number]  Date of Birth: [birth year]  Date: 03/08/2022  PCP: Pleas Koch, NP  Referral: Pleas Koch, NP  Chief Complaint  Patient presents with   Neck Pain    Pain starts in neck and shoots up to right temple   Eye Problem    Not Focusing good today-Feels like she has film over her eyes    Edema    Leg Swelling   Subjective:   [name] is a 89 y.o. very pleasant female patient with Body mass index is 24.71 kg/m. who presents with the following:  89 year old with COPD, dyspnea, anemia, hypothyroidism, depression, recent elevated cardiac BNP to 172.   B lower extremity edema.   Complicated 89 year old that I have never seen before with multiple recent OV with PCP.   Recent BNP and D-dimer were elevated, and she was found to have a superficial RLE clot.  CTA did not show evidence of PE.   She has a pending echocardiogram.   Eyes are not focusing all that well.  Will get a sharp pain in the R eye and feels like her eyes have water on them.  Vision does not seem all that impaired. Feels like there is a film over.    No neurological changes.   Has gotten really weak.  Face did not exactly act right.  Something seems to not be right.    CN 2-12 intact   Review of Systems is noted in the HPI, as appropriate  Objective:   BP 100/64   Pulse 74   Temp 98.2 F (36.8 C) (Temporal)   Ht 5' (1.524 m)   Wt 126 lb 8 oz (57.4 kg)   SpO2 96%   BMI 24.71 kg/m   GEN: No acute distress; alert,appropriate. PULM: Breathing comfortably in no respiratory distress PSYCH: Normally interactive.   Laboratory and Imaging Data:  Assessment and Plan:   ***

## 2022-03-08 NOTE — Telephone Encounter (Signed)
Per appt notes pt has already been arrived for appt to see Dr Lorelei Pont 03/08/22 at 9:20 AM. Sending note to Dr Lorelei Pont and [place] pool.    [place] Night - Client TELEPHONE ADVICE RECORD AccessNurse Patient Name: [name] Gender: Female DOB: 1928/05/02 Age: 89 Y 78 M 1 D Return Phone Number: [phone] (Primary) Address: City/ State/ Zip: [place] [address] Client [facility] Night - Client Client Site [place] - Night Provider Alma Friendly - NP Contact Type Call Who Is Calling Patient / Member / Family / Caregiver Call Type Triage / Clinical Caller Name Gwynneth Macleod Relationship To Patient Daughter Return Phone Number [phone] (Primary) Chief Complaint Leg Swelling And Edema Reason for Call Request to Schedule Office Appointment Initial Comment Caller states she wants an appt. Her mother has leg swelling, pain and right side of the face pain last night. Translation No No Triage Reason Patient declined Nurse Assessment Nurse: Windle Guard, RN, Lesa Date/Time Eilene Ghazi Time): 03/08/2022 8:56:47 AM Confirm and document reason for call. If symptomatic, describe symptoms. ---Caller states her mother's legs are swollen. Does the patient have any new or worsening symptoms? ---Yes Will a triage be completed? ---No Select reason for no triage. ---Patient declined Disp. Time Eilene Ghazi Time) Disposition Final User 03/08/2022 8:17:29 AM Attempt made - message left Conner, RN, Lesa 03/08/2022 8:31:32 AM Attempt made - no message left Windle Guard, RN, Lesa 03/08/2022 8:58:10 AM Clinical Call Yes Windle Guard, RN, Emmaline Kluver Final Disposition 03/08/2022 8:58:10 AM Clinical Call Yes Conner, RN, Romilda Joy

## 2022-03-09 ENCOUNTER — Encounter: Payer: Self-pay | Admitting: Family Medicine

## 2022-03-09 DIAGNOSIS — I872 Venous insufficiency (chronic) (peripheral): Secondary | ICD-10-CM | POA: Diagnosis not present

## 2022-03-09 DIAGNOSIS — G8929 Other chronic pain: Secondary | ICD-10-CM | POA: Diagnosis not present

## 2022-03-09 DIAGNOSIS — J439 Emphysema, unspecified: Secondary | ICD-10-CM | POA: Diagnosis not present

## 2022-03-09 DIAGNOSIS — R600 Localized edema: Secondary | ICD-10-CM | POA: Diagnosis not present

## 2022-03-09 DIAGNOSIS — R5383 Other fatigue: Secondary | ICD-10-CM | POA: Diagnosis not present

## 2022-03-09 DIAGNOSIS — M545 Low back pain, unspecified: Secondary | ICD-10-CM | POA: Diagnosis not present

## 2022-03-12 ENCOUNTER — Other Ambulatory Visit: Payer: Self-pay | Admitting: Primary Care

## 2022-03-12 DIAGNOSIS — G8929 Other chronic pain: Secondary | ICD-10-CM

## 2022-03-15 DIAGNOSIS — R600 Localized edema: Secondary | ICD-10-CM | POA: Diagnosis not present

## 2022-03-15 DIAGNOSIS — M545 Low back pain, unspecified: Secondary | ICD-10-CM | POA: Diagnosis not present

## 2022-03-15 DIAGNOSIS — I872 Venous insufficiency (chronic) (peripheral): Secondary | ICD-10-CM | POA: Diagnosis not present

## 2022-03-15 DIAGNOSIS — R5383 Other fatigue: Secondary | ICD-10-CM | POA: Diagnosis not present

## 2022-03-15 DIAGNOSIS — G8929 Other chronic pain: Secondary | ICD-10-CM | POA: Diagnosis not present

## 2022-03-15 DIAGNOSIS — J439 Emphysema, unspecified: Secondary | ICD-10-CM | POA: Diagnosis not present

## 2022-03-16 ENCOUNTER — Ambulatory Visit
Admission: RE | Admit: 2022-03-16 | Discharge: 2022-03-16 | Disposition: A | Discharge status: Home / Self Care | Payer: Medicare Other | Source: Ambulatory Visit | Attending: Primary Care | Admitting: Primary Care

## 2022-03-16 DIAGNOSIS — R0609 Other forms of dyspnea: Secondary | ICD-10-CM | POA: Diagnosis not present

## 2022-03-16 DIAGNOSIS — I351 Nonrheumatic aortic (valve) insufficiency: Secondary | ICD-10-CM | POA: Diagnosis not present

## 2022-03-16 DIAGNOSIS — R55 Syncope and collapse: Secondary | ICD-10-CM | POA: Insufficient documentation

## 2022-03-16 DIAGNOSIS — R0600 Dyspnea, unspecified: Secondary | ICD-10-CM | POA: Diagnosis not present

## 2022-03-16 DIAGNOSIS — I517 Cardiomegaly: Secondary | ICD-10-CM | POA: Diagnosis not present

## 2022-03-16 LAB — ECHOCARDIOGRAM COMPLETE
AR max vel: 2.18 cm2
AV Area VTI: 2.6 cm2
AV Area mean vel: 1.91 cm2
AV Mean grad: 2 mmHg
AV Peak grad: 4 mmHg
Ao pk vel: 1 m/s
Area-P 1/2: 5.27 cm2
Calc EF: 32.4 %
S' Lateral: 2.5 cm
Single Plane A2C EF: 9.2 %
Single Plane A4C EF: 51.2 %

## 2022-03-16 NOTE — Progress Notes (Signed)
*  PRELIMINARY RESULTS* Echocardiogram 2D Echocardiogram has been performed.  Sherrie Sport 03/16/2022, 9:37 AM

## 2022-03-17 DIAGNOSIS — R5383 Other fatigue: Secondary | ICD-10-CM | POA: Diagnosis not present

## 2022-03-17 DIAGNOSIS — M810 Age-related osteoporosis without current pathological fracture: Secondary | ICD-10-CM | POA: Diagnosis not present

## 2022-03-17 DIAGNOSIS — J439 Emphysema, unspecified: Secondary | ICD-10-CM | POA: Diagnosis not present

## 2022-03-17 DIAGNOSIS — Z9089 Acquired absence of other organs: Secondary | ICD-10-CM | POA: Diagnosis not present

## 2022-03-17 DIAGNOSIS — E538 Deficiency of other specified B group vitamins: Secondary | ICD-10-CM | POA: Diagnosis not present

## 2022-03-17 DIAGNOSIS — I951 Orthostatic hypotension: Secondary | ICD-10-CM | POA: Diagnosis not present

## 2022-03-17 DIAGNOSIS — Z87440 Personal history of urinary (tract) infections: Secondary | ICD-10-CM | POA: Diagnosis not present

## 2022-03-17 DIAGNOSIS — I872 Venous insufficiency (chronic) (peripheral): Secondary | ICD-10-CM | POA: Diagnosis not present

## 2022-03-17 DIAGNOSIS — R600 Localized edema: Secondary | ICD-10-CM | POA: Diagnosis not present

## 2022-03-17 DIAGNOSIS — F419 Anxiety disorder, unspecified: Secondary | ICD-10-CM | POA: Diagnosis not present

## 2022-03-17 DIAGNOSIS — Z8781 Personal history of (healed) traumatic fracture: Secondary | ICD-10-CM | POA: Diagnosis not present

## 2022-03-17 DIAGNOSIS — Z85828 Personal history of other malignant neoplasm of skin: Secondary | ICD-10-CM | POA: Diagnosis not present

## 2022-03-17 DIAGNOSIS — R32 Unspecified urinary incontinence: Secondary | ICD-10-CM | POA: Diagnosis not present

## 2022-03-17 DIAGNOSIS — Z8673 Personal history of transient ischemic attack (TIA), and cerebral infarction without residual deficits: Secondary | ICD-10-CM | POA: Diagnosis not present

## 2022-03-17 DIAGNOSIS — M545 Low back pain, unspecified: Secondary | ICD-10-CM | POA: Diagnosis not present

## 2022-03-17 DIAGNOSIS — E039 Hypothyroidism, unspecified: Secondary | ICD-10-CM | POA: Diagnosis not present

## 2022-03-17 DIAGNOSIS — Z9181 History of falling: Secondary | ICD-10-CM | POA: Diagnosis not present

## 2022-03-17 DIAGNOSIS — G4700 Insomnia, unspecified: Secondary | ICD-10-CM | POA: Diagnosis not present

## 2022-03-17 DIAGNOSIS — F32A Depression, unspecified: Secondary | ICD-10-CM | POA: Diagnosis not present

## 2022-03-17 DIAGNOSIS — G8929 Other chronic pain: Secondary | ICD-10-CM | POA: Diagnosis not present

## 2022-03-17 DIAGNOSIS — M1990 Unspecified osteoarthritis, unspecified site: Secondary | ICD-10-CM | POA: Diagnosis not present

## 2022-03-19 ENCOUNTER — Ambulatory Visit (INDEPENDENT_AMBULATORY_CARE_PROVIDER_SITE_OTHER): Payer: Medicare Other | Admitting: Orthopaedic Surgery

## 2022-03-19 ENCOUNTER — Ambulatory Visit (INDEPENDENT_AMBULATORY_CARE_PROVIDER_SITE_OTHER): Payer: Medicare Other

## 2022-03-19 ENCOUNTER — Encounter: Payer: Self-pay | Admitting: Orthopaedic Surgery

## 2022-03-19 VITALS — BP 152/93 | HR 86 | Ht 60.0 in | Wt 125.0 lb

## 2022-03-19 DIAGNOSIS — R0781 Pleurodynia: Secondary | ICD-10-CM

## 2022-03-19 DIAGNOSIS — S2242XA Multiple fractures of ribs, left side, initial encounter for closed fracture: Secondary | ICD-10-CM | POA: Diagnosis not present

## 2022-03-19 DIAGNOSIS — M549 Dorsalgia, unspecified: Secondary | ICD-10-CM

## 2022-03-19 DIAGNOSIS — S2249XA Multiple fractures of ribs, unspecified side, initial encounter for closed fracture: Secondary | ICD-10-CM | POA: Insufficient documentation

## 2022-03-19 MED ORDER — ACETAMINOPHEN-CODEINE 300-30 MG PO TABS: 1.0000 | ORAL_TABLET | Freq: Two times a day (BID) | ORAL | 0 refills | Status: DC | PRN

## 2022-03-19 NOTE — Progress Notes (Signed)
Office Visit Note   Patient: [name]           Date of Birth: 06-04-1928           MRN: [id number] Visit Date: 03/19/2022              Requested by: Pleas Koch, NP [place],  [place] [address] PCP: Pleas Koch, NP   Assessment & Plan: Visit Diagnoses:  1. Rib pain on left side   2. Mid back pain   3. Closed fracture of multiple ribs of left side, initial encounter     Plan: Tylenol with codeine 1 p.o. every 12 prescribed for pain.  Recheck 4 weeks.  She uses sparingly.  She can use heating pad we discussed making sure she does not burn the skin.  Follow-Up Instructions: No follow-ups on file.   Orders:  Orders Placed This Encounter  Procedures   XR Ribs Unilateral Left   XR Thoracic Spine 2 View   No orders of the defined types were placed in this encounter.     Procedures: No procedures performed   Clinical Data: No additional findings.   Subjective: Chief Complaint  Patient presents with   Middle Back - Pain    Fall 03/16/2022   left ribs    Fall 03/16/2022    HPI -year-old female fell at home in her house.  There is foot elevation rest on the floor somehow she thought it might be a chair to sit down on a fell and struck her left side ribs against the coffee table.  Previous history of kyphoplasty T11 and L1 in the past.  Current fall date was 03/16/2022.  She did have pain with inspiration pain at the mid axillary line.  She has been on gabapentin and Tylenol which helped a little bit.  She normally sleeps in a recliner she cannot lay down due to pain.  Review of Systems all systems noncontributory HPI.   Objective: Vital Signs: BP (!) 152/93   Pulse 86   Ht 5' (1.524 m)   Wt 125 lb (56.7 kg)   BMI 24.41 kg/m   Physical Exam Constitutional:      Appearance: She is well-developed.  HENT:     Head: Normocephalic.     Comments: Last is atraumatic.    Right Ear: External ear normal.     Left Ear: External ear normal.  There is no impacted cerumen.  Eyes:     Pupils: Pupils are equal, round, and reactive to light.  Neck:     Thyroid: No thyromegaly.     Trachea: No tracheal deviation.  Cardiovascular:     Rate and Rhythm: Normal rate.  Pulmonary:     Effort: Pulmonary effort is normal.     Comments: Increase axillary rib pain with inspiration. Abdominal:     Palpations: Abdomen is soft.  Musculoskeletal:     Cervical back: No rigidity.  Skin:    General: Skin is warm and dry.  Neurological:     Mental Status: She is alert and oriented to person, place, and time.  Psychiatric:        Behavior: Behavior normal.     Ortho Exam patient is tender eighth ninth 10th ribs mid axillary line.  Specialty Comments:  No specialty comments available.  Imaging: No results found.   PMFS History: Patient Active Problem List   Diagnosis Date Noted   Multiple rib fractures 03/19/2022   Dyspnea  on exertion 02/16/2022   Swelling of lower extremity 01/13/2022   Anemia 12/11/2020   Chronic back pain 08/08/2020   Osteoporosis with current pathological fracture 03/27/2019   Decreased appetite 03/27/2019   Insomnia 09/11/2018   Urinary frequency 08/30/2018   Urinary incontinence 03/21/2018   Compression fracture of body of thoracic vertebra ([place]) 09/14/2017   Anxiety and depression 09/14/2017   Syncope and collapse 10/24/2012   Orthostatic hypotension 10/24/2012   Dizziness 01/21/2011   COPD (chronic obstructive pulmonary disease) ([place]) 01/21/2011   Hypothyroidism 03/28/2008   Past Medical History:  Diagnosis Date   Anxiety    Arthritis    "hands" (10/24/2012)   B12 deficiency    "get shots twice/month" (10/24/2012)   Bursitis    Depression    Diverticulitis    Exertional shortness of breath    Headache(784.0)    "weekly at least" (10/24/2012)   Hypothyroidism    Insomnia    Ovarian cancer ([place])    mass hooked to sm. intestine/bladder   Skin cancer of face    Syncope    when neck is  up or turned to side   Syncope and collapse    "first time I ever I collapsed"; confirms h/o presyncopal events (10/24/2012)   UTI (lower urinary tract infection)     Family History  Problem Relation Age of Onset   Breast cancer Mother    Heart Problems Father    Heart attack Father     Past Surgical History:  Procedure Laterality Date   ABDOMINAL HYSTERECTOMY     CATARACT EXTRACTION W/ INTRAOCULAR LENS  IMPLANT, BILATERAL Bilateral 2000's   DILATION AND CURETTAGE OF UTERUS     several   FOOT SURGERY Right 1943-1944   4 surgeries   IR KYPHO LUMBAR INC FX REDUCE BONE BX UNI/BIL CANNULATION INC/IMAGING  10/22/2020   IR KYPHO THORACIC WITH BONE BIOPSY  07/27/2017   THYROIDECTOMY     TONSILLECTOMY     Social History   Occupational History   Not on file  Tobacco Use   Smoking status: Never   Smokeless tobacco: Never  Vaping Use   Vaping Use: Never used  Substance and Sexual Activity   Alcohol use: No   Drug use: No   Sexual activity: Never

## 2022-03-23 ENCOUNTER — Telehealth: Payer: Self-pay | Admitting: Nurse Practitioner

## 2022-03-23 NOTE — Telephone Encounter (Signed)
In absence of Allie Bossier, NO. I have sign home health orders and certifications. In box to be formed

## 2022-03-23 NOTE — Telephone Encounter (Signed)
Form faxed, copied, and sent to scan.

## 2022-03-24 DIAGNOSIS — I872 Venous insufficiency (chronic) (peripheral): Secondary | ICD-10-CM | POA: Diagnosis not present

## 2022-03-24 DIAGNOSIS — M545 Low back pain, unspecified: Secondary | ICD-10-CM | POA: Diagnosis not present

## 2022-03-24 DIAGNOSIS — R600 Localized edema: Secondary | ICD-10-CM | POA: Diagnosis not present

## 2022-03-24 DIAGNOSIS — J439 Emphysema, unspecified: Secondary | ICD-10-CM | POA: Diagnosis not present

## 2022-03-24 DIAGNOSIS — G8929 Other chronic pain: Secondary | ICD-10-CM | POA: Diagnosis not present

## 2022-03-24 DIAGNOSIS — R5383 Other fatigue: Secondary | ICD-10-CM | POA: Diagnosis not present

## 2022-03-29 ENCOUNTER — Telehealth: Payer: Self-pay | Admitting: Primary Care

## 2022-03-29 DIAGNOSIS — J439 Emphysema, unspecified: Secondary | ICD-10-CM | POA: Diagnosis not present

## 2022-03-29 DIAGNOSIS — R5383 Other fatigue: Secondary | ICD-10-CM | POA: Diagnosis not present

## 2022-03-29 DIAGNOSIS — I872 Venous insufficiency (chronic) (peripheral): Secondary | ICD-10-CM | POA: Diagnosis not present

## 2022-03-29 DIAGNOSIS — R600 Localized edema: Secondary | ICD-10-CM | POA: Diagnosis not present

## 2022-03-29 DIAGNOSIS — M545 Low back pain, unspecified: Secondary | ICD-10-CM | POA: Diagnosis not present

## 2022-03-29 DIAGNOSIS — G8929 Other chronic pain: Secondary | ICD-10-CM | POA: Diagnosis not present

## 2022-03-29 NOTE — Telephone Encounter (Signed)
Noted.  It looks like she is following with Dr. Lorin Mercy. Reviewed office notes from 03/19/22

## 2022-03-29 NOTE — Telephone Encounter (Signed)
Susan,a nurse  from [facility] called in to make sure that Anda Kraft knew that patient had a fall in her home,and now has 3 broken ribs on her left side . Her pain level goes to an 8 whenever she moves. However the nurse encouraged her to do deep breathing exercises to prevent her from getting pneumonia.  She is prescribed  acetaminophen-codeine (TYLENOL #3) 300-30 MG tablet

## 2022-04-06 DIAGNOSIS — M545 Low back pain, unspecified: Secondary | ICD-10-CM | POA: Diagnosis not present

## 2022-04-06 DIAGNOSIS — R5383 Other fatigue: Secondary | ICD-10-CM | POA: Diagnosis not present

## 2022-04-06 DIAGNOSIS — G8929 Other chronic pain: Secondary | ICD-10-CM | POA: Diagnosis not present

## 2022-04-06 DIAGNOSIS — I872 Venous insufficiency (chronic) (peripheral): Secondary | ICD-10-CM | POA: Diagnosis not present

## 2022-04-06 DIAGNOSIS — J439 Emphysema, unspecified: Secondary | ICD-10-CM | POA: Diagnosis not present

## 2022-04-06 DIAGNOSIS — R600 Localized edema: Secondary | ICD-10-CM | POA: Diagnosis not present

## 2022-04-12 DIAGNOSIS — R5383 Other fatigue: Secondary | ICD-10-CM | POA: Diagnosis not present

## 2022-04-12 DIAGNOSIS — I872 Venous insufficiency (chronic) (peripheral): Secondary | ICD-10-CM | POA: Diagnosis not present

## 2022-04-12 DIAGNOSIS — M545 Low back pain, unspecified: Secondary | ICD-10-CM | POA: Diagnosis not present

## 2022-04-12 DIAGNOSIS — J439 Emphysema, unspecified: Secondary | ICD-10-CM | POA: Diagnosis not present

## 2022-04-12 DIAGNOSIS — R600 Localized edema: Secondary | ICD-10-CM | POA: Diagnosis not present

## 2022-04-12 DIAGNOSIS — G8929 Other chronic pain: Secondary | ICD-10-CM | POA: Diagnosis not present

## 2022-04-13 ENCOUNTER — Telehealth: Payer: Self-pay | Admitting: Radiology

## 2022-04-13 MED ORDER — ACETAMINOPHEN-CODEINE 300-30 MG PO TABS: 1.0000 | ORAL_TABLET | Freq: Two times a day (BID) | ORAL | 0 refills | Status: DC | PRN

## 2022-04-13 NOTE — Addendum Note (Signed)
Addended by: Meyer Cory on: 04/13/2022 05:58 PM   Modules accepted: Orders

## 2022-04-13 NOTE — Telephone Encounter (Signed)
Received refill request from [facility]. Patient requests refill on Tylenol #3 1 po bid prn #30 given on 03/19/2022. Patient only had 2 pills left as of yesterday.  Please advise on refill.

## 2022-04-13 NOTE — Telephone Encounter (Signed)
Called to pharmacy 

## 2022-04-16 DIAGNOSIS — R5383 Other fatigue: Secondary | ICD-10-CM | POA: Diagnosis not present

## 2022-04-16 DIAGNOSIS — M810 Age-related osteoporosis without current pathological fracture: Secondary | ICD-10-CM | POA: Diagnosis not present

## 2022-04-16 DIAGNOSIS — F32A Depression, unspecified: Secondary | ICD-10-CM | POA: Diagnosis not present

## 2022-04-16 DIAGNOSIS — Z9089 Acquired absence of other organs: Secondary | ICD-10-CM | POA: Diagnosis not present

## 2022-04-16 DIAGNOSIS — I872 Venous insufficiency (chronic) (peripheral): Secondary | ICD-10-CM | POA: Diagnosis not present

## 2022-04-16 DIAGNOSIS — F419 Anxiety disorder, unspecified: Secondary | ICD-10-CM | POA: Diagnosis not present

## 2022-04-16 DIAGNOSIS — G4700 Insomnia, unspecified: Secondary | ICD-10-CM | POA: Diagnosis not present

## 2022-04-16 DIAGNOSIS — G8929 Other chronic pain: Secondary | ICD-10-CM | POA: Diagnosis not present

## 2022-04-16 DIAGNOSIS — M1990 Unspecified osteoarthritis, unspecified site: Secondary | ICD-10-CM | POA: Diagnosis not present

## 2022-04-16 DIAGNOSIS — I951 Orthostatic hypotension: Secondary | ICD-10-CM | POA: Diagnosis not present

## 2022-04-16 DIAGNOSIS — Z85828 Personal history of other malignant neoplasm of skin: Secondary | ICD-10-CM | POA: Diagnosis not present

## 2022-04-16 DIAGNOSIS — E039 Hypothyroidism, unspecified: Secondary | ICD-10-CM | POA: Diagnosis not present

## 2022-04-16 DIAGNOSIS — R600 Localized edema: Secondary | ICD-10-CM | POA: Diagnosis not present

## 2022-04-16 DIAGNOSIS — Z9181 History of falling: Secondary | ICD-10-CM | POA: Diagnosis not present

## 2022-04-16 DIAGNOSIS — J439 Emphysema, unspecified: Secondary | ICD-10-CM | POA: Diagnosis not present

## 2022-04-16 DIAGNOSIS — Z87440 Personal history of urinary (tract) infections: Secondary | ICD-10-CM | POA: Diagnosis not present

## 2022-04-16 DIAGNOSIS — E538 Deficiency of other specified B group vitamins: Secondary | ICD-10-CM | POA: Diagnosis not present

## 2022-04-16 DIAGNOSIS — M545 Low back pain, unspecified: Secondary | ICD-10-CM | POA: Diagnosis not present

## 2022-04-16 DIAGNOSIS — Z8673 Personal history of transient ischemic attack (TIA), and cerebral infarction without residual deficits: Secondary | ICD-10-CM | POA: Diagnosis not present

## 2022-04-16 DIAGNOSIS — Z8781 Personal history of (healed) traumatic fracture: Secondary | ICD-10-CM | POA: Diagnosis not present

## 2022-04-16 DIAGNOSIS — R32 Unspecified urinary incontinence: Secondary | ICD-10-CM | POA: Diagnosis not present

## 2022-04-19 ENCOUNTER — Telehealth: Payer: Self-pay | Admitting: Primary Care

## 2022-04-19 DIAGNOSIS — M545 Low back pain, unspecified: Secondary | ICD-10-CM | POA: Diagnosis not present

## 2022-04-19 DIAGNOSIS — R5383 Other fatigue: Secondary | ICD-10-CM | POA: Diagnosis not present

## 2022-04-19 DIAGNOSIS — G8929 Other chronic pain: Secondary | ICD-10-CM | POA: Diagnosis not present

## 2022-04-19 DIAGNOSIS — R600 Localized edema: Secondary | ICD-10-CM | POA: Diagnosis not present

## 2022-04-19 DIAGNOSIS — I872 Venous insufficiency (chronic) (peripheral): Secondary | ICD-10-CM | POA: Diagnosis not present

## 2022-04-19 DIAGNOSIS — J439 Emphysema, unspecified: Secondary | ICD-10-CM | POA: Diagnosis not present

## 2022-04-19 NOTE — Telephone Encounter (Signed)
Home Health verbal orders Caller Name: [name] Agency Name: [facility]  Callback number: [phone]  Requesting OT/PT/Skilled nursing/Social Work/Speech: PT evaluation   Reason: increased weakness   Frequency: 1wk 1 then go forward from evaluation   Please forward to [facility] pool or providers CMA

## 2022-04-19 NOTE — Telephone Encounter (Signed)
Called and advised [name] with [facility] of the approval of the requested verbal orders for this patient. Advised to call back with any further questions.

## 2022-04-19 NOTE — Telephone Encounter (Signed)
Approved.  

## 2022-04-26 DIAGNOSIS — J439 Emphysema, unspecified: Secondary | ICD-10-CM | POA: Diagnosis not present

## 2022-04-26 DIAGNOSIS — M545 Low back pain, unspecified: Secondary | ICD-10-CM | POA: Diagnosis not present

## 2022-04-26 DIAGNOSIS — G8929 Other chronic pain: Secondary | ICD-10-CM | POA: Diagnosis not present

## 2022-04-26 DIAGNOSIS — R5383 Other fatigue: Secondary | ICD-10-CM | POA: Diagnosis not present

## 2022-04-26 DIAGNOSIS — I872 Venous insufficiency (chronic) (peripheral): Secondary | ICD-10-CM | POA: Diagnosis not present

## 2022-04-26 DIAGNOSIS — R600 Localized edema: Secondary | ICD-10-CM | POA: Diagnosis not present

## 2022-04-29 ENCOUNTER — Telehealth: Payer: Self-pay | Admitting: Primary Care

## 2022-04-29 DIAGNOSIS — M545 Low back pain, unspecified: Secondary | ICD-10-CM | POA: Diagnosis not present

## 2022-04-29 DIAGNOSIS — G8929 Other chronic pain: Secondary | ICD-10-CM | POA: Diagnosis not present

## 2022-04-29 DIAGNOSIS — I872 Venous insufficiency (chronic) (peripheral): Secondary | ICD-10-CM | POA: Diagnosis not present

## 2022-04-29 DIAGNOSIS — J439 Emphysema, unspecified: Secondary | ICD-10-CM | POA: Diagnosis not present

## 2022-04-29 DIAGNOSIS — R5383 Other fatigue: Secondary | ICD-10-CM | POA: Diagnosis not present

## 2022-04-29 DIAGNOSIS — R600 Localized edema: Secondary | ICD-10-CM | POA: Diagnosis not present

## 2022-04-29 NOTE — Telephone Encounter (Signed)
Approved.  

## 2022-04-29 NOTE — Telephone Encounter (Signed)
VO given to Laura  ?

## 2022-04-29 NOTE — Telephone Encounter (Signed)
Home Health verbal orders Caller Name: Vernona Rieger  Agency Name: [facility] Assist [facility]   Callback number: [phone], secured   Requesting OT/PT/Skilled nursing/Social Work/Speech: PT   Reason:  Frequency: twice a week for three week, once a week for three weeks   Please forward to [facility] pool or providers CMA

## 2022-05-03 DIAGNOSIS — R5383 Other fatigue: Secondary | ICD-10-CM | POA: Diagnosis not present

## 2022-05-03 DIAGNOSIS — J439 Emphysema, unspecified: Secondary | ICD-10-CM | POA: Diagnosis not present

## 2022-05-03 DIAGNOSIS — I872 Venous insufficiency (chronic) (peripheral): Secondary | ICD-10-CM | POA: Diagnosis not present

## 2022-05-03 DIAGNOSIS — M545 Low back pain, unspecified: Secondary | ICD-10-CM | POA: Diagnosis not present

## 2022-05-03 DIAGNOSIS — R600 Localized edema: Secondary | ICD-10-CM | POA: Diagnosis not present

## 2022-05-03 DIAGNOSIS — G8929 Other chronic pain: Secondary | ICD-10-CM | POA: Diagnosis not present

## 2022-05-04 DIAGNOSIS — I872 Venous insufficiency (chronic) (peripheral): Secondary | ICD-10-CM | POA: Diagnosis not present

## 2022-05-04 DIAGNOSIS — R600 Localized edema: Secondary | ICD-10-CM | POA: Diagnosis not present

## 2022-05-04 DIAGNOSIS — J439 Emphysema, unspecified: Secondary | ICD-10-CM | POA: Diagnosis not present

## 2022-05-04 DIAGNOSIS — G8929 Other chronic pain: Secondary | ICD-10-CM | POA: Diagnosis not present

## 2022-05-04 DIAGNOSIS — R5383 Other fatigue: Secondary | ICD-10-CM | POA: Diagnosis not present

## 2022-05-04 DIAGNOSIS — M545 Low back pain, unspecified: Secondary | ICD-10-CM | POA: Diagnosis not present

## 2022-05-06 DIAGNOSIS — J439 Emphysema, unspecified: Secondary | ICD-10-CM | POA: Diagnosis not present

## 2022-05-06 DIAGNOSIS — R5383 Other fatigue: Secondary | ICD-10-CM | POA: Diagnosis not present

## 2022-05-06 DIAGNOSIS — I872 Venous insufficiency (chronic) (peripheral): Secondary | ICD-10-CM | POA: Diagnosis not present

## 2022-05-06 DIAGNOSIS — R600 Localized edema: Secondary | ICD-10-CM | POA: Diagnosis not present

## 2022-05-06 DIAGNOSIS — G8929 Other chronic pain: Secondary | ICD-10-CM | POA: Diagnosis not present

## 2022-05-06 DIAGNOSIS — M545 Low back pain, unspecified: Secondary | ICD-10-CM | POA: Diagnosis not present

## 2022-05-11 DIAGNOSIS — R5383 Other fatigue: Secondary | ICD-10-CM | POA: Diagnosis not present

## 2022-05-11 DIAGNOSIS — J439 Emphysema, unspecified: Secondary | ICD-10-CM | POA: Diagnosis not present

## 2022-05-11 DIAGNOSIS — M545 Low back pain, unspecified: Secondary | ICD-10-CM | POA: Diagnosis not present

## 2022-05-11 DIAGNOSIS — G8929 Other chronic pain: Secondary | ICD-10-CM | POA: Diagnosis not present

## 2022-05-11 DIAGNOSIS — R600 Localized edema: Secondary | ICD-10-CM | POA: Diagnosis not present

## 2022-05-11 DIAGNOSIS — I872 Venous insufficiency (chronic) (peripheral): Secondary | ICD-10-CM | POA: Diagnosis not present

## 2022-05-12 ENCOUNTER — Telehealth: Payer: Self-pay | Admitting: Primary Care

## 2022-05-12 NOTE — Telephone Encounter (Signed)
Approved.  

## 2022-05-12 NOTE — Telephone Encounter (Signed)
Home Health verbal orders Caller Name: [name] Agency Name: Glynda Jaeger number: [phone]   Recert for [facility] to continue following patient for weakness, pain, and heart failure   Please forward to [facility] pool or providers CMA

## 2022-05-13 DIAGNOSIS — J439 Emphysema, unspecified: Secondary | ICD-10-CM | POA: Diagnosis not present

## 2022-05-13 DIAGNOSIS — R5383 Other fatigue: Secondary | ICD-10-CM | POA: Diagnosis not present

## 2022-05-13 DIAGNOSIS — G8929 Other chronic pain: Secondary | ICD-10-CM | POA: Diagnosis not present

## 2022-05-13 DIAGNOSIS — M545 Low back pain, unspecified: Secondary | ICD-10-CM | POA: Diagnosis not present

## 2022-05-13 DIAGNOSIS — I872 Venous insufficiency (chronic) (peripheral): Secondary | ICD-10-CM | POA: Diagnosis not present

## 2022-05-13 DIAGNOSIS — R600 Localized edema: Secondary | ICD-10-CM | POA: Diagnosis not present

## 2022-05-13 NOTE — Telephone Encounter (Signed)
Called and advised Susan with [facility] of the approval of the requested verbal orders for this patient. Advised to call back with any further questions.   

## 2022-05-16 DIAGNOSIS — I872 Venous insufficiency (chronic) (peripheral): Secondary | ICD-10-CM | POA: Diagnosis not present

## 2022-05-16 DIAGNOSIS — F32A Depression, unspecified: Secondary | ICD-10-CM | POA: Diagnosis not present

## 2022-05-16 DIAGNOSIS — Z87440 Personal history of urinary (tract) infections: Secondary | ICD-10-CM | POA: Diagnosis not present

## 2022-05-16 DIAGNOSIS — Z8673 Personal history of transient ischemic attack (TIA), and cerebral infarction without residual deficits: Secondary | ICD-10-CM | POA: Diagnosis not present

## 2022-05-16 DIAGNOSIS — R600 Localized edema: Secondary | ICD-10-CM | POA: Diagnosis not present

## 2022-05-16 DIAGNOSIS — E039 Hypothyroidism, unspecified: Secondary | ICD-10-CM | POA: Diagnosis not present

## 2022-05-16 DIAGNOSIS — Z9181 History of falling: Secondary | ICD-10-CM | POA: Diagnosis not present

## 2022-05-16 DIAGNOSIS — I951 Orthostatic hypotension: Secondary | ICD-10-CM | POA: Diagnosis not present

## 2022-05-16 DIAGNOSIS — F419 Anxiety disorder, unspecified: Secondary | ICD-10-CM | POA: Diagnosis not present

## 2022-05-16 DIAGNOSIS — J439 Emphysema, unspecified: Secondary | ICD-10-CM | POA: Diagnosis not present

## 2022-05-16 DIAGNOSIS — Z8781 Personal history of (healed) traumatic fracture: Secondary | ICD-10-CM | POA: Diagnosis not present

## 2022-05-16 DIAGNOSIS — E538 Deficiency of other specified B group vitamins: Secondary | ICD-10-CM | POA: Diagnosis not present

## 2022-05-16 DIAGNOSIS — R5383 Other fatigue: Secondary | ICD-10-CM | POA: Diagnosis not present

## 2022-05-16 DIAGNOSIS — Z9089 Acquired absence of other organs: Secondary | ICD-10-CM | POA: Diagnosis not present

## 2022-05-16 DIAGNOSIS — G8929 Other chronic pain: Secondary | ICD-10-CM | POA: Diagnosis not present

## 2022-05-16 DIAGNOSIS — R32 Unspecified urinary incontinence: Secondary | ICD-10-CM | POA: Diagnosis not present

## 2022-05-16 DIAGNOSIS — M1990 Unspecified osteoarthritis, unspecified site: Secondary | ICD-10-CM | POA: Diagnosis not present

## 2022-05-16 DIAGNOSIS — M810 Age-related osteoporosis without current pathological fracture: Secondary | ICD-10-CM | POA: Diagnosis not present

## 2022-05-16 DIAGNOSIS — G4700 Insomnia, unspecified: Secondary | ICD-10-CM | POA: Diagnosis not present

## 2022-05-16 DIAGNOSIS — Z85828 Personal history of other malignant neoplasm of skin: Secondary | ICD-10-CM | POA: Diagnosis not present

## 2022-05-16 DIAGNOSIS — M545 Low back pain, unspecified: Secondary | ICD-10-CM | POA: Diagnosis not present

## 2022-05-17 ENCOUNTER — Telehealth: Payer: Self-pay | Admitting: Primary Care

## 2022-05-17 DIAGNOSIS — R600 Localized edema: Secondary | ICD-10-CM | POA: Diagnosis not present

## 2022-05-17 DIAGNOSIS — G8929 Other chronic pain: Secondary | ICD-10-CM | POA: Diagnosis not present

## 2022-05-17 DIAGNOSIS — R5383 Other fatigue: Secondary | ICD-10-CM | POA: Diagnosis not present

## 2022-05-17 DIAGNOSIS — I872 Venous insufficiency (chronic) (peripheral): Secondary | ICD-10-CM | POA: Diagnosis not present

## 2022-05-17 DIAGNOSIS — J439 Emphysema, unspecified: Secondary | ICD-10-CM | POA: Diagnosis not present

## 2022-05-17 DIAGNOSIS — M545 Low back pain, unspecified: Secondary | ICD-10-CM | POA: Diagnosis not present

## 2022-05-17 NOTE — Telephone Encounter (Signed)
Noted and agree with UA and urine culture. Looks like [place] NP has already verbally approved. See other phone note.

## 2022-05-17 NOTE — Telephone Encounter (Signed)
[name] from [facility] called over and requested an order for a U/A & CS for patient. She stated she can be reached at [phone] which would be best or it can be faxed over to [phone]. Sending to Mayra Reel, NP who is PCP out of office and Audria Nine, NP who is in office. Thank you!

## 2022-05-17 NOTE — Telephone Encounter (Signed)
Called and spoke to [name]. Verbal orders ok for a UA and culture and sensitivity

## 2022-05-17 NOTE — Telephone Encounter (Signed)
I spoke with pts daughter (DPR signed) and for few days pt has burning upon urination with frequency of urine. Pt has no fever and no abd pain. Pts daughter said difficult to bring pt to office for appt and offered virtual appt with pts daughter bring urine specimen to office also. I then found out that pt has Home health nurse coming to see pt this afternoon. Nettie Elm will contact [facility] nurse for verification she is coming this afternoon and then [facility] nurse will call [facility] for order to do U/A and CS. Nettie Elm will cb if needed.Sending note to Allayne Gitelman NP and [facility] pool.

## 2022-05-17 NOTE — Telephone Encounter (Signed)
Pt daughter called in requesting a virtual visit for patient may have a possible UTI willing to bring in urine because pt  is not mobile  . Daughter was advise that patient would need to come in office

## 2022-05-19 NOTE — Telephone Encounter (Signed)
Patient daughter called in to follow up on results. She stated that she hasn't heard anything back yet regarding it.

## 2022-05-19 NOTE — Telephone Encounter (Signed)
Called and advised patients daughter we have not received the information with the results back yet. Advised her to contact [facility] nurse to find out which lab facility they used and ensure they fax [place] the results once in.

## 2022-05-20 ENCOUNTER — Other Ambulatory Visit: Payer: Self-pay | Admitting: Orthopaedic Surgery

## 2022-05-20 DIAGNOSIS — R32 Unspecified urinary incontinence: Secondary | ICD-10-CM | POA: Diagnosis not present

## 2022-05-20 DIAGNOSIS — J439 Emphysema, unspecified: Secondary | ICD-10-CM | POA: Diagnosis not present

## 2022-05-20 DIAGNOSIS — R5383 Other fatigue: Secondary | ICD-10-CM | POA: Diagnosis not present

## 2022-05-20 DIAGNOSIS — Z87440 Personal history of urinary (tract) infections: Secondary | ICD-10-CM | POA: Diagnosis not present

## 2022-05-20 DIAGNOSIS — G8929 Other chronic pain: Secondary | ICD-10-CM | POA: Diagnosis not present

## 2022-05-20 DIAGNOSIS — R600 Localized edema: Secondary | ICD-10-CM | POA: Diagnosis not present

## 2022-05-20 DIAGNOSIS — I872 Venous insufficiency (chronic) (peripheral): Secondary | ICD-10-CM | POA: Diagnosis not present

## 2022-05-20 DIAGNOSIS — M545 Low back pain, unspecified: Secondary | ICD-10-CM | POA: Diagnosis not present

## 2022-05-20 NOTE — Telephone Encounter (Signed)
Called and advised patients daughter she states she is drinking plenty of water throughout the day. She is not urinating frequently throughout the day, however at night she is experiencing urinary incontinence. Patients daughter stated that the patient does not want to go to the ED. She advised me that she will make sure the patient is eating more in order to stop losing weight. [name] with [facility] advised patients daughter that the results should be back either today or tomorrow because she placed the order as STAT, and they would like to wait for the results before they do anything. Advised again that the patient should go for ED evaluation and treatment if she starts feeling worse. Advised patient to let us know when they have been given results to ensure we are given a copy.

## 2022-05-20 NOTE — Telephone Encounter (Signed)
Noted  

## 2022-05-20 NOTE — Telephone Encounter (Signed)
[name] from [place] called in and stated that patient U/A and CS was redone because last one was thrown out. She stated that they sent it over STAT. She also stated that [name] has gotten weaker and losing weight. She can be reached at [phone]. Thank you!

## 2022-05-20 NOTE — Telephone Encounter (Signed)
My recommendation is for ED evaluation and treatment, especially if she's not eating or drinking and losing weight.

## 2022-05-21 ENCOUNTER — Emergency Department
Admission: EM | Admit: 2022-05-21 | Discharge: 2022-05-21 | Disposition: A | Discharge status: Home / Self Care | Payer: Medicare Other | Attending: Emergency Medicine | Admitting: Emergency Medicine

## 2022-05-21 ENCOUNTER — Other Ambulatory Visit: Payer: Self-pay

## 2022-05-21 ENCOUNTER — Emergency Department: Payer: Medicare Other

## 2022-05-21 DIAGNOSIS — E039 Hypothyroidism, unspecified: Secondary | ICD-10-CM | POA: Insufficient documentation

## 2022-05-21 DIAGNOSIS — J168 Pneumonia due to other specified infectious organisms: Secondary | ICD-10-CM | POA: Insufficient documentation

## 2022-05-21 DIAGNOSIS — J189 Pneumonia, unspecified organism: Secondary | ICD-10-CM | POA: Diagnosis not present

## 2022-05-21 DIAGNOSIS — R300 Dysuria: Secondary | ICD-10-CM | POA: Diagnosis not present

## 2022-05-21 DIAGNOSIS — R059 Cough, unspecified: Secondary | ICD-10-CM | POA: Diagnosis present

## 2022-05-21 DIAGNOSIS — R531 Weakness: Secondary | ICD-10-CM | POA: Diagnosis not present

## 2022-05-21 DIAGNOSIS — B9689 Other specified bacterial agents as the cause of diseases classified elsewhere: Secondary | ICD-10-CM | POA: Diagnosis not present

## 2022-05-21 DIAGNOSIS — N760 Acute vaginitis: Secondary | ICD-10-CM | POA: Insufficient documentation

## 2022-05-21 DIAGNOSIS — I10 Essential (primary) hypertension: Secondary | ICD-10-CM | POA: Diagnosis not present

## 2022-05-21 LAB — COMPREHENSIVE METABOLIC PANEL
ALT: 10 U/L (ref 0–44)
AST: 19 U/L (ref 15–41)
Albumin: 3.9 g/dL (ref 3.5–5.0)
Alkaline Phosphatase: 64 U/L (ref 38–126)
Anion gap: 10 (ref 5–15)
BUN: 17 mg/dL (ref 8–23)
CO2: 23 mmol/L (ref 22–32)
Calcium: 8.8 mg/dL — ABNORMAL LOW (ref 8.9–10.3)
Chloride: 105 mmol/L (ref 98–111)
Creatinine, Ser: 0.88 mg/dL (ref 0.44–1.00)
GFR, Estimated: >60 mL/min (ref >60)
Glucose, Bld: 93 mg/dL (ref 70–99)
Potassium: 3.6 mmol/L (ref 3.5–5.1)
Sodium: 138 mmol/L (ref 135–145)
Total Bilirubin: 0.6 mg/dL (ref 0.3–1.2)
Total Protein: 6.9 g/dL (ref 6.5–8.1)

## 2022-05-21 LAB — URINALYSIS, ROUTINE W REFLEX MICROSCOPIC
Bacteria, UA: NONE SEEN
Bilirubin Urine: NEGATIVE
Glucose, UA: NEGATIVE mg/dL
Hgb urine dipstick: NEGATIVE
Ketones, ur: NEGATIVE mg/dL
Nitrite: NEGATIVE
Protein, ur: NEGATIVE mg/dL
Specific Gravity, Urine: 1.01 (ref 1.005–1.030)
pH: 5 (ref 5.0–8.0)

## 2022-05-21 LAB — CBC WITH DIFFERENTIAL/PLATELET
Abs Immature Granulocytes: 0.01 10*3/uL (ref 0.00–0.07)
Basophils Absolute: 0 10*3/uL (ref 0.0–0.1)
Basophils Relative: 1 %
Eosinophils Absolute: 0.2 10*3/uL (ref 0.0–0.5)
Eosinophils Relative: 3 %
HCT: 39.2 % (ref 36.0–46.0)
Hemoglobin: 12.6 g/dL (ref 12.0–15.0)
Immature Granulocytes: 0 %
Lymphocytes Relative: 30 %
Lymphs Abs: 1.6 10*3/uL (ref 0.7–4.0)
MCH: 29.8 pg (ref 26.0–34.0)
MCHC: 32.1 g/dL (ref 30.0–36.0)
MCV: 92.7 fL (ref 80.0–100.0)
Monocytes Absolute: 0.5 10*3/uL (ref 0.1–1.0)
Monocytes Relative: 10 %
Neutro Abs: 3.1 10*3/uL (ref 1.7–7.7)
Neutrophils Relative %: 56 %
Platelets: 213 10*3/uL (ref 150–400)
RBC: 4.23 MIL/uL (ref 3.87–5.11)
RDW: 13.5 % (ref 11.5–15.5)
WBC: 5.4 10*3/uL (ref 4.0–10.5)
nRBC: 0 % (ref 0.0–0.2)

## 2022-05-21 LAB — TROPONIN I (HIGH SENSITIVITY)
Troponin I (High Sensitivity): 12 ng/L (ref <18)
Troponin I (High Sensitivity): 12 ng/L (ref <18)

## 2022-05-21 LAB — LACTIC ACID, PLASMA: Lactic Acid, Venous: 1.7 mmol/L (ref 0.5–1.9)

## 2022-05-21 LAB — T4, FREE: Free T4: 0.87 ng/dL (ref 0.61–1.12)

## 2022-05-21 LAB — TSH: TSH: 3.2 u[IU]/mL (ref 0.350–4.500)

## 2022-05-21 MED ORDER — SODIUM CHLORIDE 0.9 % IV BOLUS
1000.0000 mL | Freq: Once | INTRAVENOUS | Status: AC
  Administered 2022-05-21: 1000 mL via INTRAVENOUS

## 2022-05-21 MED ORDER — LEVOFLOXACIN 750 MG PO TABS: 750.0000 mg | ORAL_TABLET | Freq: Every day | ORAL | 0 refills | Status: AC

## 2022-05-21 MED ORDER — CLOTRIMAZOLE 1 % EX LOTN: 1.0000 | TOPICAL_LOTION | Freq: Every day | CUTANEOUS | 0 refills | Status: DC

## 2022-05-21 MED ORDER — LEVOFLOXACIN 500 MG PO TABS
750.0000 mg | ORAL_TABLET | Freq: Once | ORAL | Status: AC
  Administered 2022-05-21: 750 mg via ORAL
  Filled 2022-05-21: qty 2

## 2022-05-21 NOTE — ED Provider Triage Note (Signed)
Emergency Medicine Provider Triage Evaluation Note  [name] , a 89 y.o. female  was evaluated in triage.  Pt complains of weakness, possible UTI, weight loss, not eating and drinking.  Daughter states she thinks she might be dehydrated and have a UTI.  3 pounds weight loss in the last week..  Review of Systems  Positive:  Negative:   Physical Exam  BP (!) 162/73 (BP Location: Right Arm)   Pulse 85   Temp 98.4 F (36.9 C) (Oral)   Resp 16   Ht 5' (1.524 m)   Wt 53.5 kg   SpO2 96%   BMI 23.05 kg/m  Gen:   Awake, no distress   Resp:  Normal effort  MSK:   Moves extremities without difficulty  Other:    Medical Decision Making  Medically screening exam initiated at 2:12 PM.  Appropriate orders placed.  Careena L Doss was informed that the remainder of the evaluation will be completed by another provider, this initial triage assessment does not replace that evaluation, and the importance of remaining in the ED until their evaluation is complete.     Faythe Ghee, PA-C 05/21/22 1413

## 2022-05-21 NOTE — Telephone Encounter (Signed)
[name] from [place] called in and stated that [name] is experiencing more weaknesses and may be dehydrated. She stated that she has seen her a few times this week and she isn't like herself. Spoke with Jae Dire and informed [name] we haven't received any results yet. Will be on the look out for them. Thank you!

## 2022-05-21 NOTE — ED Provider Notes (Signed)
[facility] Provider Note    Event Date/Time   First MD Initiated Contact with Patient 05/21/22 1634     (approximate)   History   Weakness   HPI  [name] is a 89 y.o. female   Past medical history of hypothyroid, B12 deficiency, frequent UTIs who presents to the emergency department with generalized weakness in the setting of a mild cough and also some burning with urination.  This is been going on the last several days.  She denies systemic infectious symptoms like fever or cough.  She denies pain in the chest or abdomen.  She denies any falls.  She denies any other acute medical complaints.. She wonders whether her burning with urination is due to some irritation around her vaginal area due to wiping too hard after urination.  She denies any itching in the area.   Independent Historian contributed to assessment above: Her daughters were at bedside who corroborate information given above      Physical Exam   Triage Vital Signs: ED Triage Vitals [05/21/22 1358]  Enc Vitals Group     BP (!) 162/73     Pulse Rate 85     Resp 16     Temp 98.4 F (36.9 C)     Temp Source Oral     SpO2 96 %     Weight 118 lb (53.5 kg)     Height 5' (1.524 m)     Head Circumference      Peak Flow      Pain Score 0     Pain Loc      Pain Edu?      Excl. in GC?     Most recent vital signs: Vitals:   05/21/22 1730 05/21/22 1900  BP: 131/72 (!) 172/67  Pulse: 79 66  Resp: 16 18  Temp:    SpO2: 98% 98%    General: Awake, no distress.  CV:  Good peripheral perfusion.  Resp:  Normal effort.  Abd:  No distention.  Other:  Awake alert comfortable appearing.  She has dry mucous membranes poor skin turgor and looks slightly dehydrated.  Her abdomen soft and nontender.  There is no obvious candidal infections changes in her vaginal area or vulva.  Her lungs are clear.  She has no fever.   ED Results / Procedures / Treatments   Labs (all labs ordered  are listed, but only abnormal results are displayed) Labs Reviewed  COMPREHENSIVE METABOLIC PANEL - Abnormal; Notable for the following components:      Result Value   Calcium 8.8 (*)    All other components within normal limits  URINALYSIS, ROUTINE W REFLEX MICROSCOPIC - Abnormal; Notable for the following components:   Color, Urine YELLOW (*)    APPearance CLEAR (*)    Leukocytes,Ua TRACE (*)    All other components within normal limits  LACTIC ACID, PLASMA  CBC WITH DIFFERENTIAL/PLATELET  TSH  T4, FREE  CBC WITH DIFFERENTIAL/PLATELET  TROPONIN I (HIGH SENSITIVITY)  TROPONIN I (HIGH SENSITIVITY)     I ordered and reviewed the above labs they are notable for white blood cell count is normal.  Her urinalysis shows no signs of infection.  EKG  ED ECG REPORT I, Pilar Jarvis, the attending physician, personally viewed and interpreted this ECG.   Date: 05/21/2022  EKG Time: 1416  Rate: 89  Rhythm: nsr  Axis: nl  Intervals:none  ST&T Change: nostemi    RADIOLOGY I  independently reviewed and interpreted chest x-ray and see no obvious focalities or pneumothorax   PROCEDURES:  Critical Care performed: No  Procedures   MEDICATIONS ORDERED IN ED: Medications  sodium chloride 0.9 % bolus 1,000 mL (0 mLs Intravenous Stopped 05/21/22 1833)  levofloxacin (LEVAQUIN) tablet 750 mg (750 mg Oral Given 05/21/22 1855)   IMPRESSION / MDM / ASSESSMENT AND PLAN / ED COURSE  I reviewed the triage vital signs and the nursing notes.                                Patient's presentation is most consistent with acute presentation with potential threat to life or bodily function.  Differential diagnosis includes, but is not limited to, urinary tract infection, vulvovaginitis, yeast infection, atrophic vaginitis, respiratory infection, viral URI, bacterial pneumonia, sepsis   The patient is on the cardiac monitor to evaluate for evidence of arrhythmia and/or significant heart rate  changes.  MDM: This patient with generalized weakness and a mild cough with chest x-ray suggestive of small opacity, normal white blood cell count, nontoxic and does not appear septic.  She does have some dysuria but her urinalysis looks not infected.  Perhaps a vaginitis atrophic versus yeast infection.  She feels much better and energized with a fluid bolus.  She looked a little bit dehydrated on my initial evaluation.  Since workup above is largely unremarkable and she remained stable and feels well and has good home care at home with her daughter, outpatient therapy and follow-up most appropriate at this time patient and family in agreement.  I will prescribe her levofloxacin given her cough and evidence of pneumonia on chest x-ray which would also cover for potential UTI given her dysuria.  I gave her some vaginal yeast infection creams to use and she will follow-up with her PMD.       FINAL CLINICAL IMPRESSION(S) / ED DIAGNOSES   Final diagnoses:  Generalized weakness  Dysuria  Vulvovaginitis  Pneumonia due to infectious organism, unspecified laterality, unspecified part of lung     Rx / DC Orders   ED Discharge Orders          Ordered    levofloxacin (LEVAQUIN) 750 MG tablet  Daily        05/21/22 1901    Clotrimazole 1 % LOTN  Daily        05/21/22 1901             Note:  This document was prepared using Dragon voice recognition software and may include unintentional dictation errors.    Pilar Jarvis, MD 05/22/22 [phone]

## 2022-05-21 NOTE — Telephone Encounter (Signed)
Patient has been picked up now and she is on the way to [facility] ED now.

## 2022-05-21 NOTE — Telephone Encounter (Signed)
Noted  

## 2022-05-21 NOTE — Telephone Encounter (Signed)
Called and spoke with patients daughter Artemio Aly, advised that we have not received any results yet from [facility]. Advised given patients increase weakness and dehydration we strongly advise she be evaluated at ED. Patients daughter stated they will go since they do have the results back yet.

## 2022-05-21 NOTE — ED Triage Notes (Signed)
Pt from home via EMS d/t weakness and possible UTI. Pt denies any pain at this time

## 2022-05-21 NOTE — Discharge Instructions (Signed)
Use antibiotics and vaginal cream as prescribed, and call your doctor for reevaluation this week.  If you experience any new or worsening symptoms come back to the emergency department for reevaluation.

## 2022-05-21 NOTE — Telephone Encounter (Signed)
Yes, I strongly advised that she go to the ED for dehydration and what appears to be a urinary tract infection.  I do not see where they have made it to an emergency department.  Have a gone yet?

## 2022-05-24 ENCOUNTER — Encounter: Payer: Self-pay | Admitting: Primary Care

## 2022-05-24 DIAGNOSIS — J439 Emphysema, unspecified: Secondary | ICD-10-CM | POA: Diagnosis not present

## 2022-05-24 DIAGNOSIS — R600 Localized edema: Secondary | ICD-10-CM | POA: Diagnosis not present

## 2022-05-24 DIAGNOSIS — G8929 Other chronic pain: Secondary | ICD-10-CM | POA: Diagnosis not present

## 2022-05-24 DIAGNOSIS — R5383 Other fatigue: Secondary | ICD-10-CM | POA: Diagnosis not present

## 2022-05-24 DIAGNOSIS — M545 Low back pain, unspecified: Secondary | ICD-10-CM | POA: Diagnosis not present

## 2022-05-24 DIAGNOSIS — I872 Venous insufficiency (chronic) (peripheral): Secondary | ICD-10-CM | POA: Diagnosis not present

## 2022-05-26 DIAGNOSIS — R5383 Other fatigue: Secondary | ICD-10-CM | POA: Diagnosis not present

## 2022-05-26 DIAGNOSIS — R600 Localized edema: Secondary | ICD-10-CM | POA: Diagnosis not present

## 2022-05-26 DIAGNOSIS — M545 Low back pain, unspecified: Secondary | ICD-10-CM | POA: Diagnosis not present

## 2022-05-26 DIAGNOSIS — I872 Venous insufficiency (chronic) (peripheral): Secondary | ICD-10-CM | POA: Diagnosis not present

## 2022-05-26 DIAGNOSIS — G8929 Other chronic pain: Secondary | ICD-10-CM | POA: Diagnosis not present

## 2022-05-26 DIAGNOSIS — J439 Emphysema, unspecified: Secondary | ICD-10-CM | POA: Diagnosis not present

## 2022-06-01 DIAGNOSIS — M545 Low back pain, unspecified: Secondary | ICD-10-CM | POA: Diagnosis not present

## 2022-06-01 DIAGNOSIS — R5383 Other fatigue: Secondary | ICD-10-CM | POA: Diagnosis not present

## 2022-06-01 DIAGNOSIS — J439 Emphysema, unspecified: Secondary | ICD-10-CM | POA: Diagnosis not present

## 2022-06-01 DIAGNOSIS — I872 Venous insufficiency (chronic) (peripheral): Secondary | ICD-10-CM | POA: Diagnosis not present

## 2022-06-01 DIAGNOSIS — R600 Localized edema: Secondary | ICD-10-CM | POA: Diagnosis not present

## 2022-06-01 DIAGNOSIS — G8929 Other chronic pain: Secondary | ICD-10-CM | POA: Diagnosis not present

## 2022-06-08 ENCOUNTER — Encounter: Payer: Self-pay | Admitting: Primary Care

## 2022-06-08 DIAGNOSIS — R5383 Other fatigue: Secondary | ICD-10-CM | POA: Diagnosis not present

## 2022-06-08 DIAGNOSIS — I872 Venous insufficiency (chronic) (peripheral): Secondary | ICD-10-CM | POA: Diagnosis not present

## 2022-06-08 DIAGNOSIS — R600 Localized edema: Secondary | ICD-10-CM | POA: Diagnosis not present

## 2022-06-08 DIAGNOSIS — G8929 Other chronic pain: Secondary | ICD-10-CM | POA: Diagnosis not present

## 2022-06-08 DIAGNOSIS — J439 Emphysema, unspecified: Secondary | ICD-10-CM | POA: Diagnosis not present

## 2022-06-08 DIAGNOSIS — M545 Low back pain, unspecified: Secondary | ICD-10-CM | POA: Diagnosis not present

## 2022-06-09 ENCOUNTER — Ambulatory Visit (INDEPENDENT_AMBULATORY_CARE_PROVIDER_SITE_OTHER): Payer: Medicare Other

## 2022-06-09 VITALS — Ht 60.0 in | Wt 111.0 lb

## 2022-06-09 DIAGNOSIS — R600 Localized edema: Secondary | ICD-10-CM | POA: Diagnosis not present

## 2022-06-09 DIAGNOSIS — R5383 Other fatigue: Secondary | ICD-10-CM | POA: Diagnosis not present

## 2022-06-09 DIAGNOSIS — J439 Emphysema, unspecified: Secondary | ICD-10-CM | POA: Diagnosis not present

## 2022-06-09 DIAGNOSIS — Z0000 Encounter for general adult medical examination without abnormal findings: Secondary | ICD-10-CM | POA: Diagnosis not present

## 2022-06-09 DIAGNOSIS — G8929 Other chronic pain: Secondary | ICD-10-CM | POA: Diagnosis not present

## 2022-06-09 DIAGNOSIS — I872 Venous insufficiency (chronic) (peripheral): Secondary | ICD-10-CM | POA: Diagnosis not present

## 2022-06-09 DIAGNOSIS — M545 Low back pain, unspecified: Secondary | ICD-10-CM | POA: Diagnosis not present

## 2022-06-09 NOTE — Progress Notes (Signed)
I connected with  [name] and daughter [name] on 06/09/22 by a audio enabled telemedicine application and verified that I am speaking with the correct person using two identifiers.  Patient Location: Home  Provider Location: Home Office  I discussed the limitations of evaluation and management by telemedicine. The patient expressed understanding and agreed to proceed.  Subjective:   [name] is a 89 y.o. female who presents for Medicare Annual (Subsequent) preventive examination.  Review of Systems      Cardiac Risk Factors include: advanced age (>20men, >18 women);sedentary lifestyle     Objective:    Today's Vitals   06/09/22 1132  Weight: 111 lb (50.3 kg)  Height: 5' (1.524 m)   Body mass index is 21.68 kg/m.     06/09/2022   11:48 AM 05/21/2022    1:59 PM 06/03/2021   12:22 PM 10/22/2020   10:36 AM 08/03/2020    4:16 PM 05/30/2020    2:51 PM 04/26/2019    3:21 PM  Advanced Directives  Does Patient Have a Medical Advance Directive? Yes No Yes Yes Yes Yes Yes  Type of Estate agent of [place];Living will  Healthcare Power of [place];Living will Healthcare Power of [place];Living will Living will Healthcare Power of [place];Living will Healthcare Power of [place];Living will  Does patient want to make changes to medical advance directive?    No - Patient declined     Copy of Healthcare Power of Attorney in Chart? No - copy requested  No - copy requested No - copy requested  No - copy requested No - copy requested    Current Medications (verified) Outpatient Encounter Medications as of 06/09/2022  Medication Sig   acetaminophen (TYLENOL) 650 MG CR tablet Take 1,300 mg by mouth every 8 (eight) hours as needed for pain.   ADVAIR DISKUS 250-50 MCG/ACT AEPB INHALE 1 PUFF INTO THE LUNGS 2 TIMES DAILY.   albuterol (PROVENTIL) (2.5 MG/3ML) 0.083% nebulizer solution Take 3 mLs (2.5 mg total) by nebulization every 6 (six) hours as needed  for wheezing or shortness of breath.   Cholecalciferol (VITAMIN D) 2000 units tablet Take 2,000 Units by mouth daily.   Clotrimazole 1 % LOTN Apply 1 Application topically daily.   docusate sodium (COLACE) 100 MG capsule Take 100 mg by mouth daily.   DULoxetine (CYMBALTA) 30 MG capsule TAKE 1 CAPSULE BY MOUTH DAILY FOR ANXIETY AND DEPRESSION   ferrous sulfate 325 (65 FE) MG tablet Take 325 mg by mouth daily.   furosemide (LASIX) 20 MG tablet Take 1 tablet (20 mg total) by mouth daily as needed for fluid or edema.   gabapentin (NEURONTIN) 100 MG capsule TAKE 1 CAPSULE BY MOUTH 3 TIMES DAILY FOR BACK PAIN.   mirtazapine (REMERON) 15 MG tablet TAKE 1/2 TO 1 TABLET BY MOUTH AT BEDTIME FOR SLEEP AND APPETITE.   SYNTHROID 100 MCG tablet TAKE 1 TABLET BY MOUTH ON EMPTY STOMACH WITH WATER ONLY ON SUNDAY THROUGH THURSDAY, NO FOOD OR OTHER MEDICINE FOR 30 MINUTES.   SYNTHROID 112 MCG tablet TAKE 1 TABLET BY MOUTH EVERY MORNING ON EMPTY STOMACH WITH WATER ONLY ON FRIDAY AND SATURDAY, NO FOOD OR OTHER MEDICINES FOR 30 MINUTES.   acetaminophen-codeine (TYLENOL #3) 300-30 MG tablet Take 1 tablet by mouth every 12 (twelve) hours as needed for moderate pain. (Patient not taking: Reported on 06/09/2022)   alendronate (FOSAMAX) 70 MG tablet TAKE 1 TABLET BY MOUTH ONCE A WEEK (ON MONDAYS). TAKE WITH A FULL  GLASS OF WATER ON AN EMPTY STOMACH. (Patient not taking: Reported on 06/09/2022)   Cyanocobalamin (B-12) 5000 MCG CAPS Take 5,000 mcg by mouth daily. (Patient not taking: Reported on 06/09/2022)   No facility-administered encounter medications on file as of 06/09/2022.    Allergies (verified) Penicillins and Prednisone   History: Past Medical History:  Diagnosis Date   Anxiety    Arthritis    "hands" (10/24/2012)   B12 deficiency    "get shots twice/month" (10/24/2012)   Bursitis    Depression    Diverticulitis    Exertional shortness of breath    Headache(784.0)    "weekly at least" (10/24/2012)    Hypothyroidism    Insomnia    Ovarian cancer (HCC)    mass hooked to sm. intestine/bladder   Skin cancer of face    Syncope    when neck is up or turned to side   Syncope and collapse    "first time I ever I collapsed"; confirms h/o presyncopal events (10/24/2012)   UTI (lower urinary tract infection)    Past Surgical History:  Procedure Laterality Date   ABDOMINAL HYSTERECTOMY     CATARACT EXTRACTION W/ INTRAOCULAR LENS  IMPLANT, BILATERAL Bilateral 2000's   DILATION AND CURETTAGE OF UTERUS     several   FOOT SURGERY Right 1943-1944   4 surgeries   IR KYPHO LUMBAR INC FX REDUCE BONE BX UNI/BIL CANNULATION INC/IMAGING  10/22/2020   IR KYPHO THORACIC WITH BONE BIOPSY  07/27/2017   THYROIDECTOMY     TONSILLECTOMY     Family History  Problem Relation Age of Onset   Breast cancer Mother    Heart Problems Father    Heart attack Father    Social History   Socioeconomic History   Marital status: Widowed    Spouse name: Not on file   Number of children: Not on file   Years of education: Not on file   Highest education level: Not on file  Occupational History   Not on file  Tobacco Use   Smoking status: Never   Smokeless tobacco: Never  Vaping Use   Vaping Use: Never used  Substance and Sexual Activity   Alcohol use: No   Drug use: No   Sexual activity: Never  Other Topics Concern   Not on file  Social History Narrative   Not on file   Social Determinants of Health   Financial Resource Strain: Low Risk  (06/09/2022)   Overall Financial Resource Strain (CARDIA)    Difficulty of Paying Living Expenses: Not hard at all  Food Insecurity: No Food Insecurity (06/09/2022)   Hunger Vital Sign    Worried About Running Out of Food in the Last Year: Never true    Ran Out of Food in the Last Year: Never true  Transportation Needs: No Transportation Needs (06/09/2022)   PRAPARE - Administrator, Civil Service (Medical): No    Lack of Transportation (Non-Medical): No   Physical Activity: Insufficiently Active (06/09/2022)   Exercise Vital Sign    Days of Exercise per Week: 3 days    Minutes of Exercise per Session: 20 min  Stress: No Stress Concern Present (06/09/2022)   [employer] of Occupational Health - Occupational Stress Questionnaire    Feeling of Stress : Not at all  Social Connections: Socially Isolated (06/09/2022)   Social Connection and Isolation Panel [NHANES]    Frequency of Communication with Friends and Family: More than three times a week  Frequency of Social Gatherings with Friends and Family: More than three times a week    Attends Religious Services: Never    Database administrator or Organizations: No    Attends Banker Meetings: Never    Marital Status: Widowed    Tobacco Counseling Counseling given: Not Answered   Clinical Intake:  Pre-visit preparation completed: Yes  Pain : No/denies pain     Nutritional Risks: Unintentional weight loss (9lbs in 1 week) Diabetes: No  How often do you need to have someone help you when you read instructions, pamphlets, or other written materials from your doctor or pharmacy?: 1 - Never  Diabetic? no  Interpreter Needed?: No  Information entered by :: C.Yaacov Koziol Lpn   Activities of Daily Living    06/09/2022   11:50 AM 02/16/2022   12:38 PM  In your present state of health, do you have any difficulty performing the following activities:  Hearing? 0 1  Vision? 0 0  Difficulty concentrating or making decisions? 0 1  Walking or climbing stairs? 1 1  Comment walker   Dressing or bathing? 1 0  Comment daughter assists   Doing errands, shopping? 1 1  Comment daughter assists   Quarry manager and eating ? Y   Using the Toilet? Y   In the past six months, have you accidently leaked urine? N   Do you have problems with loss of bowel control? N   Managing your Medications? Y   Comment daughter assists with ADL's   Managing your Finances? Y   Housekeeping  or managing your Housekeeping? Y     Patient Care Team: Doreene Nest, NP as PCP - General (Internal Medicine) End, Cristal Deer, MD as PCP - Cardiology (Cardiology) Kathyrn Sheriff, [facility] as Pharmacist (Pharmacist)  Indicate any recent Medical Services you may have received from other than [facility] providers in the past year (date may be approximate).     Assessment:   This is a routine wellness examination for Mendy.  Hearing/Vision screen Hearing Screening - Comments:: No aids - no hearing issues per pt. Vision Screening - Comments:: Glasses - [facility]  Dietary issues and exercise activities discussed: Current Exercise Habits: Home exercise routine, Type of exercise: stretching, Time (Minutes): 20, Frequency (Times/Week): 3, Weekly Exercise (Minutes/Week): 60, Intensity: Mild, Exercise limited by: Other - see comments (walker)   Goals Addressed             This Visit's Progress    Patient Stated       Keep moving.       Depression Screen    06/09/2022   11:46 AM 02/23/2022   11:34 AM 02/16/2022   12:37 PM 06/03/2021   12:15 PM 05/30/2020    3:02 PM 04/26/2019    3:23 PM 03/21/2018    2:39 PM  PHQ 2/9 Scores  PHQ - 2 Score 0 1 4 1  0 0 3  PHQ- 9 Score  11 9  0 0 10    Fall Risk    06/09/2022   11:49 AM 02/23/2022   11:33 AM 02/16/2022   12:39 PM 06/03/2021   12:23 PM 05/30/2020    2:53 PM  Fall Risk   Falls in the past year? 1 1 0 1 1  Number falls in past yr: 1 0 0 0 1  Injury with Fall? 1 1 0 1 0  Comment broke 3 ribs      Risk for fall due to : Impaired balance/gait;Impaired  mobility;Impaired vision History of fall(s)  Impaired balance/gait;Impaired mobility;History of fall(s) Medication side effect  Follow up Falls evaluation completed;Education provided;Falls prevention discussed  Falls evaluation completed Falls prevention discussed Falls evaluation completed;Falls prevention discussed    FALL RISK PREVENTION PERTAINING TO THE HOME:  Any stairs in or  around the home? No  If so, are there any without handrails? No  Home free of loose throw rugs in walkways, pet beds, electrical cords, etc? Yes  Adequate lighting in your home to reduce risk of falls? Yes   ASSISTIVE DEVICES UTILIZED TO PREVENT FALLS:  Life alert? No  Use of a cane, walker or w/c? Yes  Grab bars in the bathroom? Yes  Shower chair or bench in shower? Yes  Elevated toilet seat or a handicapped toilet? Yes    Cognitive Function:    05/30/2020    3:03 PM 04/26/2019    3:25 PM 03/21/2018    3:10 PM  MMSE - Mini Mental State Exam  Orientation to time 5 5 3   Orientation to time comments   disoriented to year despite cues given  Orientation to Place 5 5 5   Registration 3 3 3   Attention/ Calculation 5 5 0  Recall 3 3 3   Language- name 2 objects   0  Language- repeat 1 1 1   Language- follow 3 step command   2  Language- follow 3 step command-comments   unable to follow 1 step of 3 step command despite cues given  Language- read & follow direction   0  Write a sentence   0  Copy design   0  Total score   17        06/09/2022   11:52 AM 06/03/2021   12:25 PM  6CIT Screen  What Year? 4 points 0 points  What month? 0 points 0 points  What time? 0 points 0 points  Count back from 20 0 points 0 points  Months in reverse 4 points 4 points  Repeat phrase 10 points 0 points  Total Score 18 points 4 points    Immunizations Immunization History  Administered Date(s) Administered   Influenza,inj,Quad PF,6+ Mos 09/14/2017   Influenza-Unspecified 10/23/2012   PFIZER(Purple Top)SARS-COV-2 Vaccination 05/12/2019, 06/02/2019   Zoster, Live 06/12/2015    TDAP status: Due, Education has been provided regarding the importance of this vaccine. Advised may receive this vaccine at local pharmacy or Health Dept. Aware to provide a copy of the vaccination record if obtained from local pharmacy or Health Dept. Verbalized acceptance and understanding.  Flu Vaccine status:  Declined, Education has been provided regarding the importance of this vaccine but patient still declined. Advised may receive this vaccine at local pharmacy or Health Dept. Aware to provide a copy of the vaccination record if obtained from local pharmacy or Health Dept. Verbalized acceptance and understanding.  Pneumococcal vaccine status: Declined,  Education has been provided regarding the importance of this vaccine but patient still declined. Advised may receive this vaccine at local pharmacy or Health Dept. Aware to provide a copy of the vaccination record if obtained from local pharmacy or Health Dept. Verbalized acceptance and understanding.   Covid-19 vaccine status: Declined, Education has been provided regarding the importance of this vaccine but patient still declined. Advised may receive this vaccine at local pharmacy or Health Dept.or vaccine clinic. Aware to provide a copy of the vaccination record if obtained from local pharmacy or Health Dept. Verbalized acceptance and understanding.  Qualifies for Shingles Vaccine? Yes  Zostavax completed No   Shingrix Completed?: No.    Education has been provided regarding the importance of this vaccine. Patient has been advised to call insurance company to determine out of pocket expense if they have not yet received this vaccine. Advised may also receive vaccine at local pharmacy or Health Dept. Verbalized acceptance and understanding.  Screening Tests Health Maintenance  Topic Date Due   Pneumonia Vaccine 63+ Years old (1 of 2 - PCV) Never done   DTaP/Tdap/Td (1 - Tdap) Never done   Zoster Vaccines- Shingrix (1 of 2) Never done   COVID-19 Vaccine (3 - 2023-24 season) 09/11/2021   INFLUENZA VACCINE  08/12/2022   Medicare Annual Wellness (AWV)  06/09/2023   DEXA SCAN  Completed   HPV VACCINES  Aged Out    Health Maintenance  Health Maintenance Due  Topic Date Due   Pneumonia Vaccine 15+ Years old (1 of 2 - PCV) Never done    DTaP/Tdap/Td (1 - Tdap) Never done   Zoster Vaccines- Shingrix (1 of 2) Never done   COVID-19 Vaccine (3 - 2023-24 season) 09/11/2021    Colorectal cancer screening: No longer required.   Mammogram status: No longer required due to age.  Bone Density - Declined  Lung Cancer Screening: (Low Dose CT Chest recommended if Age 19-80 years, 30 pack-year currently smoking OR have quit w/in 15years.) does not qualify.   Lung Cancer Screening Referral: no  Additional Screening:  Hepatitis C Screening: does not qualify; Completed no  Vision Screening: Recommended annual ophthalmology exams for early detection of glaucoma and other disorders of the eye. Is the patient up to date with their annual eye exam?  Yes  Who is the provider or what is the name of the office in which the patient attends annual eye exams? [facility] If pt is not established with a provider, would they like to be referred to a provider to establish care? Yes .   Dental Screening: Recommended annual dental exams for proper oral hygiene  Community Resource Referral / Chronic Care Management: CRR required this visit?  No   CCM required this visit?  No      Plan:     I have personally reviewed and noted the following in the patient's chart:   Medical and social history Use of alcohol, tobacco or illicit drugs  Current medications and supplements including opioid prescriptions. Patient is not currently taking opioid prescriptions. Functional ability and status Nutritional status Physical activity Advanced directives List of other physicians Hospitalizations, surgeries, and ER visits in previous 12 months Vitals Screenings to include cognitive, depression, and falls Referrals and appointments  In addition, I have reviewed and discussed with patient certain preventive protocols, quality metrics, and best practice recommendations. A written personalized care plan for preventive services as well as general  preventive health recommendations were provided to patient.     Maryan Puls, LPN   [id number]   Nurse Notes: 6CIT score 18 Vaccinations: declines all Influenza vaccine: recommend every Fall Pneumococcal vaccine: recommend once per lifetime Prevnar-20 Tdap vaccine: recommend every 10 years Shingles vaccine: recommend Shingrix which is 2 doses 2-6 months apart and over 90% effective     Covid-19: recommend 2 doses one month apart with a booster 6 months later

## 2022-06-09 NOTE — Patient Instructions (Signed)
[name] , Thank you for taking time to come for your Medicare Wellness Visit. I appreciate your ongoing commitment to your health goals. Please review the following plan we discussed and let me know if I can assist you in the future.   These are the goals we discussed:  Goals      Have 3 meals a day     Continue to eat healthy.     Patient Stated     Starting 03/21/18, I will continue to take medications as prescribed.      Patient Stated     04/26/2019, I will maintain and continue medications as prescribed.      Patient Stated     05/30/2020, I will maintain and continue medications as prescribed.     Patient Stated     Keep moving.        This is a list of the screening recommended for you and due dates:  Health Maintenance  Topic Date Due   Pneumonia Vaccine (1 of 2 - PCV) Never done   DTaP/Tdap/Td vaccine (1 - Tdap) Never done   Zoster (Shingles) Vaccine (1 of 2) Never done   COVID-19 Vaccine (3 - 2023-24 season) 09/11/2021   Flu Shot  08/12/2022   Medicare Annual Wellness Visit  06/09/2023   DEXA scan (bone density measurement)  Completed   HPV Vaccine  Aged Out    Advanced directives: Please bring a copy of your health care power of attorney and living will to the office to be added to your chart at your convenience.   Conditions/risks identified: Aim for 30 minutes of exercise or brisk walking, 6-8 glasses of water, and 5 servings of fruits and vegetables each day.   Next appointment: Follow up in one year for your annual wellness visit 06/13/23 @ 10:45 televisit   Preventive Care 65 Years and Older, Female Preventive care refers to lifestyle choices and visits with your health care provider that can promote health and wellness. What does preventive care include? A yearly physical exam. This is also called an annual well check. Dental exams once or twice a year. Routine eye exams. Ask your health care provider how often you should have your eyes  checked. Personal lifestyle choices, including: Daily care of your teeth and gums. Regular physical activity. Eating a healthy diet. Avoiding tobacco and drug use. Limiting alcohol use. Practicing safe sex. Taking low-dose aspirin every day. Taking vitamin and mineral supplements as recommended by your health care provider. What happens during an annual well check? The services and screenings done by your health care provider during your annual well check will depend on your age, overall health, lifestyle risk factors, and family history of disease. Counseling  Your health care provider may ask you questions about your: Alcohol use. Tobacco use. Drug use. Emotional well-being. Home and relationship well-being. Sexual activity. Eating habits. History of falls. Memory and ability to understand (cognition). Work and work Astronomer. Reproductive health. Screening  You may have the following tests or measurements: Height, weight, and BMI. Blood pressure. Lipid and cholesterol levels. These may be checked every 5 years, or more frequently if you are over 50 years old. Skin check. Lung cancer screening. You may have this screening every year starting at age 9 if you have a 30-pack-year history of smoking and currently smoke or have quit within the past 15 years. Fecal occult blood test (FOBT) of the stool. You may have this test every year starting at age 17.  Flexible sigmoidoscopy or colonoscopy. You may have a sigmoidoscopy every 5 years or a colonoscopy every 10 years starting at age 56. Hepatitis C blood test. Hepatitis B blood test. Sexually transmitted disease (STD) testing. Diabetes screening. This is done by checking your blood sugar (glucose) after you have not eaten for a while (fasting). You may have this done every 1-3 years. Bone density scan. This is done to screen for osteoporosis. You may have this done starting at age 50. Mammogram. This may be done every 1-2  years. Talk to your health care provider about how often you should have regular mammograms. Talk with your health care provider about your test results, treatment options, and if necessary, the need for more tests. Vaccines  Your health care provider may recommend certain vaccines, such as: Influenza vaccine. This is recommended every year. Tetanus, diphtheria, and acellular pertussis (Tdap, Td) vaccine. You may need a Td booster every 10 years. Zoster vaccine. You may need this after age 79. Pneumococcal 13-valent conjugate (PCV13) vaccine. One dose is recommended after age 30. Pneumococcal polysaccharide (PPSV23) vaccine. One dose is recommended after age 13. Talk to your health care provider about which screenings and vaccines you need and how often you need them. This information is not intended to replace advice given to you by your health care provider. Make sure you discuss any questions you have with your health care provider. Document Released: 01/24/2015 Document Revised: 09/17/2015 Document Reviewed: 10/29/2014 Elsevier Interactive Patient Education  2017 [employer].  Fall Prevention in the Home Falls can cause injuries. They can happen to people of all ages. There are many things you can do to make your home safe and to help prevent falls. What can I do on the outside of my home? Regularly fix the edges of walkways and driveways and fix any cracks. Remove anything that might make you trip as you walk through a door, such as a raised step or threshold. Trim any bushes or trees on the path to your home. Use bright outdoor lighting. Clear any walking paths of anything that might make someone trip, such as rocks or tools. Regularly check to see if handrails are loose or broken. Make sure that both sides of any steps have handrails. Any raised decks and porches should have guardrails on the edges. Have any leaves, snow, or ice cleared regularly. Use sand or salt on walking paths  during winter. Clean up any spills in your garage right away. This includes oil or grease spills. What can I do in the bathroom? Use night lights. Install grab bars by the toilet and in the tub and shower. Do not use towel bars as grab bars. Use non-skid mats or decals in the tub or shower. If you need to sit down in the shower, use a plastic, non-slip stool. Keep the floor dry. Clean up any water that spills on the floor as soon as it happens. Remove soap buildup in the tub or shower regularly. Attach bath mats securely with double-sided non-slip rug tape. Do not have throw rugs and other things on the floor that can make you trip. What can I do in the bedroom? Use night lights. Make sure that you have a light by your bed that is easy to reach. Do not use any sheets or blankets that are too big for your bed. They should not hang down onto the floor. Have a firm chair that has side arms. You can use this for support while you get  dressed. Do not have throw rugs and other things on the floor that can make you trip. What can I do in the kitchen? Clean up any spills right away. Avoid walking on wet floors. Keep items that you use a lot in easy-to-reach places. If you need to reach something above you, use a strong step stool that has a grab bar. Keep electrical cords out of the way. Do not use floor polish or wax that makes floors slippery. If you must use wax, use non-skid floor wax. Do not have throw rugs and other things on the floor that can make you trip. What can I do with my stairs? Do not leave any items on the stairs. Make sure that there are handrails on both sides of the stairs and use them. Fix handrails that are broken or loose. Make sure that handrails are as long as the stairways. Check any carpeting to make sure that it is firmly attached to the stairs. Fix any carpet that is loose or worn. Avoid having throw rugs at the top or bottom of the stairs. If you do have throw  rugs, attach them to the floor with carpet tape. Make sure that you have a light switch at the top of the stairs and the bottom of the stairs. If you do not have them, ask someone to add them for you. What else can I do to help prevent falls? Wear shoes that: Do not have high heels. Have rubber bottoms. Are comfortable and fit you well. Are closed at the toe. Do not wear sandals. If you use a stepladder: Make sure that it is fully opened. Do not climb a closed stepladder. Make sure that both sides of the stepladder are locked into place. Ask someone to hold it for you, if possible. Clearly mark and make sure that you can see: Any grab bars or handrails. First and last steps. Where the edge of each step is. Use tools that help you move around (mobility aids) if they are needed. These include: Canes. Walkers. Scooters. Crutches. Turn on the lights when you go into a dark area. Replace any light bulbs as soon as they burn out. Set up your furniture so you have a clear path. Avoid moving your furniture around. If any of your floors are uneven, fix them. If there are any pets around you, be aware of where they are. Review your medicines with your doctor. Some medicines can make you feel dizzy. This can increase your chance of falling. Ask your doctor what other things that you can do to help prevent falls. This information is not intended to replace advice given to you by your health care provider. Make sure you discuss any questions you have with your health care provider. Document Released: 10/24/2008 Document Revised: 06/05/2015 Document Reviewed: 02/01/2014 Elsevier Interactive Patient Education  2017 [employer].

## 2022-06-10 ENCOUNTER — Other Ambulatory Visit: Payer: Self-pay | Admitting: Primary Care

## 2022-06-10 DIAGNOSIS — R630 Anorexia: Secondary | ICD-10-CM

## 2022-06-10 DIAGNOSIS — F419 Anxiety disorder, unspecified: Secondary | ICD-10-CM

## 2022-06-10 DIAGNOSIS — G4700 Insomnia, unspecified: Secondary | ICD-10-CM

## 2022-06-15 ENCOUNTER — Other Ambulatory Visit: Payer: Self-pay | Admitting: Primary Care

## 2022-06-15 DIAGNOSIS — Z87440 Personal history of urinary (tract) infections: Secondary | ICD-10-CM | POA: Diagnosis not present

## 2022-06-15 DIAGNOSIS — I951 Orthostatic hypotension: Secondary | ICD-10-CM | POA: Diagnosis not present

## 2022-06-15 DIAGNOSIS — R600 Localized edema: Secondary | ICD-10-CM | POA: Diagnosis not present

## 2022-06-15 DIAGNOSIS — Z8781 Personal history of (healed) traumatic fracture: Secondary | ICD-10-CM | POA: Diagnosis not present

## 2022-06-15 DIAGNOSIS — Z9089 Acquired absence of other organs: Secondary | ICD-10-CM | POA: Diagnosis not present

## 2022-06-15 DIAGNOSIS — R5383 Other fatigue: Secondary | ICD-10-CM | POA: Diagnosis not present

## 2022-06-15 DIAGNOSIS — J439 Emphysema, unspecified: Secondary | ICD-10-CM | POA: Diagnosis not present

## 2022-06-15 DIAGNOSIS — E039 Hypothyroidism, unspecified: Secondary | ICD-10-CM | POA: Diagnosis not present

## 2022-06-15 DIAGNOSIS — F419 Anxiety disorder, unspecified: Secondary | ICD-10-CM | POA: Diagnosis not present

## 2022-06-15 DIAGNOSIS — G8929 Other chronic pain: Secondary | ICD-10-CM | POA: Diagnosis not present

## 2022-06-15 DIAGNOSIS — G4700 Insomnia, unspecified: Secondary | ICD-10-CM | POA: Diagnosis not present

## 2022-06-15 DIAGNOSIS — R32 Unspecified urinary incontinence: Secondary | ICD-10-CM | POA: Diagnosis not present

## 2022-06-15 DIAGNOSIS — Z85828 Personal history of other malignant neoplasm of skin: Secondary | ICD-10-CM | POA: Diagnosis not present

## 2022-06-15 DIAGNOSIS — Z9181 History of falling: Secondary | ICD-10-CM | POA: Diagnosis not present

## 2022-06-15 DIAGNOSIS — M1990 Unspecified osteoarthritis, unspecified site: Secondary | ICD-10-CM | POA: Diagnosis not present

## 2022-06-15 DIAGNOSIS — R630 Anorexia: Secondary | ICD-10-CM

## 2022-06-15 DIAGNOSIS — E538 Deficiency of other specified B group vitamins: Secondary | ICD-10-CM | POA: Diagnosis not present

## 2022-06-15 DIAGNOSIS — I872 Venous insufficiency (chronic) (peripheral): Secondary | ICD-10-CM | POA: Diagnosis not present

## 2022-06-15 DIAGNOSIS — M810 Age-related osteoporosis without current pathological fracture: Secondary | ICD-10-CM | POA: Diagnosis not present

## 2022-06-15 DIAGNOSIS — F32A Depression, unspecified: Secondary | ICD-10-CM | POA: Diagnosis not present

## 2022-06-15 DIAGNOSIS — M545 Low back pain, unspecified: Secondary | ICD-10-CM | POA: Diagnosis not present

## 2022-06-15 DIAGNOSIS — Z8673 Personal history of transient ischemic attack (TIA), and cerebral infarction without residual deficits: Secondary | ICD-10-CM | POA: Diagnosis not present

## 2022-06-16 ENCOUNTER — Telehealth: Payer: Self-pay | Admitting: Primary Care

## 2022-06-16 DIAGNOSIS — R630 Anorexia: Secondary | ICD-10-CM

## 2022-06-16 DIAGNOSIS — R531 Weakness: Secondary | ICD-10-CM

## 2022-06-16 NOTE — Telephone Encounter (Signed)
Yes, she qualifies for hospice. I will place that referral now. Please notify her daughter.

## 2022-06-16 NOTE — Telephone Encounter (Signed)
[facility] [phone]  Family wants home health if possible Daughter brought up palliative  Saw her yesterday Remarkably decline, increased weakness, down to 79 Daughter can not leave her side anytime Hasn't fallen  Not eating well Not sure about hydration, trying to keep her hydrated, but it is problematic  Would she qualify for hospice?  Nettie Elm does not know how she would get the patient in to see Jae Dire, please advise

## 2022-06-16 NOTE — Telephone Encounter (Signed)
Called patients daughter and reviewed all information. She verbalized understanding. Will call if any further questions.  

## 2022-06-17 DIAGNOSIS — G8929 Other chronic pain: Secondary | ICD-10-CM | POA: Diagnosis not present

## 2022-06-17 DIAGNOSIS — F419 Anxiety disorder, unspecified: Secondary | ICD-10-CM | POA: Diagnosis not present

## 2022-06-17 DIAGNOSIS — M549 Dorsalgia, unspecified: Secondary | ICD-10-CM | POA: Diagnosis not present

## 2022-06-17 DIAGNOSIS — R32 Unspecified urinary incontinence: Secondary | ICD-10-CM | POA: Diagnosis not present

## 2022-06-17 DIAGNOSIS — M8000XS Age-related osteoporosis with current pathological fracture, unspecified site, sequela: Secondary | ICD-10-CM | POA: Diagnosis not present

## 2022-06-17 DIAGNOSIS — Z515 Encounter for palliative care: Secondary | ICD-10-CM | POA: Diagnosis not present

## 2022-06-17 DIAGNOSIS — R55 Syncope and collapse: Secondary | ICD-10-CM | POA: Diagnosis not present

## 2022-06-17 DIAGNOSIS — F32A Depression, unspecified: Secondary | ICD-10-CM | POA: Diagnosis not present

## 2022-06-17 DIAGNOSIS — E039 Hypothyroidism, unspecified: Secondary | ICD-10-CM | POA: Diagnosis not present

## 2022-06-17 DIAGNOSIS — D649 Anemia, unspecified: Secondary | ICD-10-CM | POA: Diagnosis not present

## 2022-06-17 DIAGNOSIS — J441 Chronic obstructive pulmonary disease with (acute) exacerbation: Secondary | ICD-10-CM | POA: Diagnosis not present

## 2022-06-17 DIAGNOSIS — S22000A Wedge compression fracture of unspecified thoracic vertebra, initial encounter for closed fracture: Secondary | ICD-10-CM | POA: Diagnosis not present

## 2022-06-17 DIAGNOSIS — S2249XA Multiple fractures of ribs, unspecified side, initial encounter for closed fracture: Secondary | ICD-10-CM | POA: Diagnosis not present

## 2022-06-17 DIAGNOSIS — R42 Dizziness and giddiness: Secondary | ICD-10-CM | POA: Diagnosis not present

## 2022-06-17 DIAGNOSIS — G4700 Insomnia, unspecified: Secondary | ICD-10-CM | POA: Diagnosis not present

## 2022-06-17 DIAGNOSIS — R630 Anorexia: Secondary | ICD-10-CM | POA: Diagnosis not present

## 2022-06-17 DIAGNOSIS — R0609 Other forms of dyspnea: Secondary | ICD-10-CM | POA: Diagnosis not present

## 2022-06-17 DIAGNOSIS — R350 Frequency of micturition: Secondary | ICD-10-CM | POA: Diagnosis not present

## 2022-06-17 DIAGNOSIS — I951 Orthostatic hypotension: Secondary | ICD-10-CM | POA: Diagnosis not present

## 2022-06-17 DIAGNOSIS — M7989 Other specified soft tissue disorders: Secondary | ICD-10-CM | POA: Diagnosis not present

## 2022-06-21 DIAGNOSIS — S2249XA Multiple fractures of ribs, unspecified side, initial encounter for closed fracture: Secondary | ICD-10-CM | POA: Diagnosis not present

## 2022-06-21 DIAGNOSIS — J441 Chronic obstructive pulmonary disease with (acute) exacerbation: Secondary | ICD-10-CM | POA: Diagnosis not present

## 2022-06-21 DIAGNOSIS — M549 Dorsalgia, unspecified: Secondary | ICD-10-CM | POA: Diagnosis not present

## 2022-06-21 DIAGNOSIS — D649 Anemia, unspecified: Secondary | ICD-10-CM | POA: Diagnosis not present

## 2022-06-21 DIAGNOSIS — M7989 Other specified soft tissue disorders: Secondary | ICD-10-CM | POA: Diagnosis not present

## 2022-06-21 DIAGNOSIS — R0609 Other forms of dyspnea: Secondary | ICD-10-CM | POA: Diagnosis not present

## 2022-06-23 ENCOUNTER — Other Ambulatory Visit: Payer: Self-pay | Admitting: Primary Care

## 2022-06-23 DIAGNOSIS — F419 Anxiety disorder, unspecified: Secondary | ICD-10-CM

## 2022-06-23 DIAGNOSIS — E039 Hypothyroidism, unspecified: Secondary | ICD-10-CM

## 2022-06-23 DIAGNOSIS — G4700 Insomnia, unspecified: Secondary | ICD-10-CM

## 2022-06-23 DIAGNOSIS — R630 Anorexia: Secondary | ICD-10-CM

## 2022-06-24 DIAGNOSIS — J441 Chronic obstructive pulmonary disease with (acute) exacerbation: Secondary | ICD-10-CM | POA: Diagnosis not present

## 2022-06-24 DIAGNOSIS — M549 Dorsalgia, unspecified: Secondary | ICD-10-CM | POA: Diagnosis not present

## 2022-06-24 DIAGNOSIS — S2249XA Multiple fractures of ribs, unspecified side, initial encounter for closed fracture: Secondary | ICD-10-CM | POA: Diagnosis not present

## 2022-06-24 DIAGNOSIS — R0609 Other forms of dyspnea: Secondary | ICD-10-CM | POA: Diagnosis not present

## 2022-06-24 DIAGNOSIS — D649 Anemia, unspecified: Secondary | ICD-10-CM | POA: Diagnosis not present

## 2022-06-24 DIAGNOSIS — M7989 Other specified soft tissue disorders: Secondary | ICD-10-CM | POA: Diagnosis not present

## 2022-06-28 DIAGNOSIS — M7989 Other specified soft tissue disorders: Secondary | ICD-10-CM | POA: Diagnosis not present

## 2022-06-28 DIAGNOSIS — J441 Chronic obstructive pulmonary disease with (acute) exacerbation: Secondary | ICD-10-CM | POA: Diagnosis not present

## 2022-06-28 DIAGNOSIS — R0609 Other forms of dyspnea: Secondary | ICD-10-CM | POA: Diagnosis not present

## 2022-06-28 DIAGNOSIS — M549 Dorsalgia, unspecified: Secondary | ICD-10-CM | POA: Diagnosis not present

## 2022-06-28 DIAGNOSIS — D649 Anemia, unspecified: Secondary | ICD-10-CM | POA: Diagnosis not present

## 2022-06-28 DIAGNOSIS — S2249XA Multiple fractures of ribs, unspecified side, initial encounter for closed fracture: Secondary | ICD-10-CM | POA: Diagnosis not present

## 2022-07-01 DIAGNOSIS — R0609 Other forms of dyspnea: Secondary | ICD-10-CM | POA: Diagnosis not present

## 2022-07-01 DIAGNOSIS — S2249XA Multiple fractures of ribs, unspecified side, initial encounter for closed fracture: Secondary | ICD-10-CM | POA: Diagnosis not present

## 2022-07-01 DIAGNOSIS — J441 Chronic obstructive pulmonary disease with (acute) exacerbation: Secondary | ICD-10-CM | POA: Diagnosis not present

## 2022-07-01 DIAGNOSIS — D649 Anemia, unspecified: Secondary | ICD-10-CM | POA: Diagnosis not present

## 2022-07-01 DIAGNOSIS — M549 Dorsalgia, unspecified: Secondary | ICD-10-CM | POA: Diagnosis not present

## 2022-07-01 DIAGNOSIS — M7989 Other specified soft tissue disorders: Secondary | ICD-10-CM | POA: Diagnosis not present

## 2022-07-05 DIAGNOSIS — M7989 Other specified soft tissue disorders: Secondary | ICD-10-CM | POA: Diagnosis not present

## 2022-07-05 DIAGNOSIS — M549 Dorsalgia, unspecified: Secondary | ICD-10-CM | POA: Diagnosis not present

## 2022-07-05 DIAGNOSIS — R0609 Other forms of dyspnea: Secondary | ICD-10-CM | POA: Diagnosis not present

## 2022-07-05 DIAGNOSIS — J441 Chronic obstructive pulmonary disease with (acute) exacerbation: Secondary | ICD-10-CM | POA: Diagnosis not present

## 2022-07-05 DIAGNOSIS — D649 Anemia, unspecified: Secondary | ICD-10-CM | POA: Diagnosis not present

## 2022-07-05 DIAGNOSIS — S2249XA Multiple fractures of ribs, unspecified side, initial encounter for closed fracture: Secondary | ICD-10-CM | POA: Diagnosis not present

## 2022-07-06 DIAGNOSIS — D649 Anemia, unspecified: Secondary | ICD-10-CM | POA: Diagnosis not present

## 2022-07-06 DIAGNOSIS — S2249XA Multiple fractures of ribs, unspecified side, initial encounter for closed fracture: Secondary | ICD-10-CM | POA: Diagnosis not present

## 2022-07-06 DIAGNOSIS — J441 Chronic obstructive pulmonary disease with (acute) exacerbation: Secondary | ICD-10-CM | POA: Diagnosis not present

## 2022-07-06 DIAGNOSIS — R0609 Other forms of dyspnea: Secondary | ICD-10-CM | POA: Diagnosis not present

## 2022-07-06 DIAGNOSIS — M7989 Other specified soft tissue disorders: Secondary | ICD-10-CM | POA: Diagnosis not present

## 2022-07-06 DIAGNOSIS — M549 Dorsalgia, unspecified: Secondary | ICD-10-CM | POA: Diagnosis not present

## 2022-07-12 DIAGNOSIS — E039 Hypothyroidism, unspecified: Secondary | ICD-10-CM | POA: Diagnosis not present

## 2022-07-12 DIAGNOSIS — R42 Dizziness and giddiness: Secondary | ICD-10-CM | POA: Diagnosis not present

## 2022-07-12 DIAGNOSIS — R0609 Other forms of dyspnea: Secondary | ICD-10-CM | POA: Diagnosis not present

## 2022-07-12 DIAGNOSIS — S2249XA Multiple fractures of ribs, unspecified side, initial encounter for closed fracture: Secondary | ICD-10-CM | POA: Diagnosis not present

## 2022-07-12 DIAGNOSIS — R32 Unspecified urinary incontinence: Secondary | ICD-10-CM | POA: Diagnosis not present

## 2022-07-12 DIAGNOSIS — R55 Syncope and collapse: Secondary | ICD-10-CM | POA: Diagnosis not present

## 2022-07-12 DIAGNOSIS — R630 Anorexia: Secondary | ICD-10-CM | POA: Diagnosis not present

## 2022-07-12 DIAGNOSIS — S22000A Wedge compression fracture of unspecified thoracic vertebra, initial encounter for closed fracture: Secondary | ICD-10-CM | POA: Diagnosis not present

## 2022-07-12 DIAGNOSIS — Z515 Encounter for palliative care: Secondary | ICD-10-CM | POA: Diagnosis not present

## 2022-07-12 DIAGNOSIS — I951 Orthostatic hypotension: Secondary | ICD-10-CM | POA: Diagnosis not present

## 2022-07-12 DIAGNOSIS — M8000XS Age-related osteoporosis with current pathological fracture, unspecified site, sequela: Secondary | ICD-10-CM | POA: Diagnosis not present

## 2022-07-12 DIAGNOSIS — M7989 Other specified soft tissue disorders: Secondary | ICD-10-CM | POA: Diagnosis not present

## 2022-07-12 DIAGNOSIS — J441 Chronic obstructive pulmonary disease with (acute) exacerbation: Secondary | ICD-10-CM | POA: Diagnosis not present

## 2022-07-12 DIAGNOSIS — F32A Depression, unspecified: Secondary | ICD-10-CM | POA: Diagnosis not present

## 2022-07-12 DIAGNOSIS — M549 Dorsalgia, unspecified: Secondary | ICD-10-CM | POA: Diagnosis not present

## 2022-07-12 DIAGNOSIS — F419 Anxiety disorder, unspecified: Secondary | ICD-10-CM | POA: Diagnosis not present

## 2022-07-12 DIAGNOSIS — D649 Anemia, unspecified: Secondary | ICD-10-CM | POA: Diagnosis not present

## 2022-07-12 DIAGNOSIS — R350 Frequency of micturition: Secondary | ICD-10-CM | POA: Diagnosis not present

## 2022-07-12 DIAGNOSIS — G8929 Other chronic pain: Secondary | ICD-10-CM | POA: Diagnosis not present

## 2022-07-12 DIAGNOSIS — G4700 Insomnia, unspecified: Secondary | ICD-10-CM | POA: Diagnosis not present

## 2022-07-13 DIAGNOSIS — M7989 Other specified soft tissue disorders: Secondary | ICD-10-CM | POA: Diagnosis not present

## 2022-07-13 DIAGNOSIS — R0609 Other forms of dyspnea: Secondary | ICD-10-CM | POA: Diagnosis not present

## 2022-07-13 DIAGNOSIS — M549 Dorsalgia, unspecified: Secondary | ICD-10-CM | POA: Diagnosis not present

## 2022-07-13 DIAGNOSIS — J441 Chronic obstructive pulmonary disease with (acute) exacerbation: Secondary | ICD-10-CM | POA: Diagnosis not present

## 2022-07-13 DIAGNOSIS — S2249XA Multiple fractures of ribs, unspecified side, initial encounter for closed fracture: Secondary | ICD-10-CM | POA: Diagnosis not present

## 2022-07-13 DIAGNOSIS — D649 Anemia, unspecified: Secondary | ICD-10-CM | POA: Diagnosis not present

## 2022-07-17 DIAGNOSIS — J441 Chronic obstructive pulmonary disease with (acute) exacerbation: Secondary | ICD-10-CM | POA: Diagnosis not present

## 2022-07-17 DIAGNOSIS — S2249XA Multiple fractures of ribs, unspecified side, initial encounter for closed fracture: Secondary | ICD-10-CM | POA: Diagnosis not present

## 2022-07-17 DIAGNOSIS — M7989 Other specified soft tissue disorders: Secondary | ICD-10-CM | POA: Diagnosis not present

## 2022-07-17 DIAGNOSIS — D649 Anemia, unspecified: Secondary | ICD-10-CM | POA: Diagnosis not present

## 2022-07-17 DIAGNOSIS — M549 Dorsalgia, unspecified: Secondary | ICD-10-CM | POA: Diagnosis not present

## 2022-07-17 DIAGNOSIS — R0609 Other forms of dyspnea: Secondary | ICD-10-CM | POA: Diagnosis not present

## 2022-07-21 DIAGNOSIS — D649 Anemia, unspecified: Secondary | ICD-10-CM | POA: Diagnosis not present

## 2022-07-21 DIAGNOSIS — R0609 Other forms of dyspnea: Secondary | ICD-10-CM | POA: Diagnosis not present

## 2022-07-21 DIAGNOSIS — M549 Dorsalgia, unspecified: Secondary | ICD-10-CM | POA: Diagnosis not present

## 2022-07-21 DIAGNOSIS — J441 Chronic obstructive pulmonary disease with (acute) exacerbation: Secondary | ICD-10-CM | POA: Diagnosis not present

## 2022-07-21 DIAGNOSIS — S2249XA Multiple fractures of ribs, unspecified side, initial encounter for closed fracture: Secondary | ICD-10-CM | POA: Diagnosis not present

## 2022-07-21 DIAGNOSIS — M7989 Other specified soft tissue disorders: Secondary | ICD-10-CM | POA: Diagnosis not present

## 2022-07-26 DIAGNOSIS — J441 Chronic obstructive pulmonary disease with (acute) exacerbation: Secondary | ICD-10-CM | POA: Diagnosis not present

## 2022-07-26 DIAGNOSIS — R0609 Other forms of dyspnea: Secondary | ICD-10-CM | POA: Diagnosis not present

## 2022-07-26 DIAGNOSIS — D649 Anemia, unspecified: Secondary | ICD-10-CM | POA: Diagnosis not present

## 2022-07-26 DIAGNOSIS — M7989 Other specified soft tissue disorders: Secondary | ICD-10-CM | POA: Diagnosis not present

## 2022-07-26 DIAGNOSIS — M549 Dorsalgia, unspecified: Secondary | ICD-10-CM | POA: Diagnosis not present

## 2022-07-26 DIAGNOSIS — S2249XA Multiple fractures of ribs, unspecified side, initial encounter for closed fracture: Secondary | ICD-10-CM | POA: Diagnosis not present

## 2022-07-27 DIAGNOSIS — R0609 Other forms of dyspnea: Secondary | ICD-10-CM | POA: Diagnosis not present

## 2022-07-27 DIAGNOSIS — M7989 Other specified soft tissue disorders: Secondary | ICD-10-CM | POA: Diagnosis not present

## 2022-07-27 DIAGNOSIS — J441 Chronic obstructive pulmonary disease with (acute) exacerbation: Secondary | ICD-10-CM | POA: Diagnosis not present

## 2022-07-27 DIAGNOSIS — S2249XA Multiple fractures of ribs, unspecified side, initial encounter for closed fracture: Secondary | ICD-10-CM | POA: Diagnosis not present

## 2022-07-27 DIAGNOSIS — M549 Dorsalgia, unspecified: Secondary | ICD-10-CM | POA: Diagnosis not present

## 2022-07-27 DIAGNOSIS — D649 Anemia, unspecified: Secondary | ICD-10-CM | POA: Diagnosis not present

## 2022-08-03 DIAGNOSIS — S2249XA Multiple fractures of ribs, unspecified side, initial encounter for closed fracture: Secondary | ICD-10-CM | POA: Diagnosis not present

## 2022-08-03 DIAGNOSIS — J441 Chronic obstructive pulmonary disease with (acute) exacerbation: Secondary | ICD-10-CM | POA: Diagnosis not present

## 2022-08-03 DIAGNOSIS — R0609 Other forms of dyspnea: Secondary | ICD-10-CM | POA: Diagnosis not present

## 2022-08-03 DIAGNOSIS — M7989 Other specified soft tissue disorders: Secondary | ICD-10-CM | POA: Diagnosis not present

## 2022-08-03 DIAGNOSIS — M549 Dorsalgia, unspecified: Secondary | ICD-10-CM | POA: Diagnosis not present

## 2022-08-03 DIAGNOSIS — D649 Anemia, unspecified: Secondary | ICD-10-CM | POA: Diagnosis not present

## 2022-08-07 DIAGNOSIS — S2249XA Multiple fractures of ribs, unspecified side, initial encounter for closed fracture: Secondary | ICD-10-CM | POA: Diagnosis not present

## 2022-08-07 DIAGNOSIS — M7989 Other specified soft tissue disorders: Secondary | ICD-10-CM | POA: Diagnosis not present

## 2022-08-07 DIAGNOSIS — R0609 Other forms of dyspnea: Secondary | ICD-10-CM | POA: Diagnosis not present

## 2022-08-07 DIAGNOSIS — J441 Chronic obstructive pulmonary disease with (acute) exacerbation: Secondary | ICD-10-CM | POA: Diagnosis not present

## 2022-08-07 DIAGNOSIS — D649 Anemia, unspecified: Secondary | ICD-10-CM | POA: Diagnosis not present

## 2022-08-07 DIAGNOSIS — M549 Dorsalgia, unspecified: Secondary | ICD-10-CM | POA: Diagnosis not present

## 2022-08-09 DIAGNOSIS — R0609 Other forms of dyspnea: Secondary | ICD-10-CM | POA: Diagnosis not present

## 2022-08-09 DIAGNOSIS — S2249XA Multiple fractures of ribs, unspecified side, initial encounter for closed fracture: Secondary | ICD-10-CM | POA: Diagnosis not present

## 2022-08-09 DIAGNOSIS — M549 Dorsalgia, unspecified: Secondary | ICD-10-CM | POA: Diagnosis not present

## 2022-08-09 DIAGNOSIS — M7989 Other specified soft tissue disorders: Secondary | ICD-10-CM | POA: Diagnosis not present

## 2022-08-09 DIAGNOSIS — J441 Chronic obstructive pulmonary disease with (acute) exacerbation: Secondary | ICD-10-CM | POA: Diagnosis not present

## 2022-08-09 DIAGNOSIS — D649 Anemia, unspecified: Secondary | ICD-10-CM | POA: Diagnosis not present

## 2022-08-12 DIAGNOSIS — R32 Unspecified urinary incontinence: Secondary | ICD-10-CM | POA: Diagnosis not present

## 2022-08-12 DIAGNOSIS — S22000A Wedge compression fracture of unspecified thoracic vertebra, initial encounter for closed fracture: Secondary | ICD-10-CM | POA: Diagnosis not present

## 2022-08-12 DIAGNOSIS — I951 Orthostatic hypotension: Secondary | ICD-10-CM | POA: Diagnosis not present

## 2022-08-12 DIAGNOSIS — F419 Anxiety disorder, unspecified: Secondary | ICD-10-CM | POA: Diagnosis not present

## 2022-08-12 DIAGNOSIS — D649 Anemia, unspecified: Secondary | ICD-10-CM | POA: Diagnosis not present

## 2022-08-12 DIAGNOSIS — G4700 Insomnia, unspecified: Secondary | ICD-10-CM | POA: Diagnosis not present

## 2022-08-12 DIAGNOSIS — J441 Chronic obstructive pulmonary disease with (acute) exacerbation: Secondary | ICD-10-CM | POA: Diagnosis not present

## 2022-08-12 DIAGNOSIS — R55 Syncope and collapse: Secondary | ICD-10-CM | POA: Diagnosis not present

## 2022-08-12 DIAGNOSIS — Z515 Encounter for palliative care: Secondary | ICD-10-CM | POA: Diagnosis not present

## 2022-08-12 DIAGNOSIS — M7989 Other specified soft tissue disorders: Secondary | ICD-10-CM | POA: Diagnosis not present

## 2022-08-12 DIAGNOSIS — S2249XA Multiple fractures of ribs, unspecified side, initial encounter for closed fracture: Secondary | ICD-10-CM | POA: Diagnosis not present

## 2022-08-12 DIAGNOSIS — M8000XS Age-related osteoporosis with current pathological fracture, unspecified site, sequela: Secondary | ICD-10-CM | POA: Diagnosis not present

## 2022-08-12 DIAGNOSIS — F32A Depression, unspecified: Secondary | ICD-10-CM | POA: Diagnosis not present

## 2022-08-12 DIAGNOSIS — R350 Frequency of micturition: Secondary | ICD-10-CM | POA: Diagnosis not present

## 2022-08-12 DIAGNOSIS — R42 Dizziness and giddiness: Secondary | ICD-10-CM | POA: Diagnosis not present

## 2022-08-12 DIAGNOSIS — R0609 Other forms of dyspnea: Secondary | ICD-10-CM | POA: Diagnosis not present

## 2022-08-12 DIAGNOSIS — G8929 Other chronic pain: Secondary | ICD-10-CM | POA: Diagnosis not present

## 2022-08-12 DIAGNOSIS — R630 Anorexia: Secondary | ICD-10-CM | POA: Diagnosis not present

## 2022-08-12 DIAGNOSIS — M549 Dorsalgia, unspecified: Secondary | ICD-10-CM | POA: Diagnosis not present

## 2022-08-12 DIAGNOSIS — E039 Hypothyroidism, unspecified: Secondary | ICD-10-CM | POA: Diagnosis not present

## 2022-08-16 DIAGNOSIS — M549 Dorsalgia, unspecified: Secondary | ICD-10-CM | POA: Diagnosis not present

## 2022-08-16 DIAGNOSIS — M7989 Other specified soft tissue disorders: Secondary | ICD-10-CM | POA: Diagnosis not present

## 2022-08-16 DIAGNOSIS — R0609 Other forms of dyspnea: Secondary | ICD-10-CM | POA: Diagnosis not present

## 2022-08-16 DIAGNOSIS — S2249XA Multiple fractures of ribs, unspecified side, initial encounter for closed fracture: Secondary | ICD-10-CM | POA: Diagnosis not present

## 2022-08-16 DIAGNOSIS — J441 Chronic obstructive pulmonary disease with (acute) exacerbation: Secondary | ICD-10-CM | POA: Diagnosis not present

## 2022-08-16 DIAGNOSIS — D649 Anemia, unspecified: Secondary | ICD-10-CM | POA: Diagnosis not present

## 2022-08-17 DIAGNOSIS — J441 Chronic obstructive pulmonary disease with (acute) exacerbation: Secondary | ICD-10-CM | POA: Diagnosis not present

## 2022-08-17 DIAGNOSIS — M549 Dorsalgia, unspecified: Secondary | ICD-10-CM | POA: Diagnosis not present

## 2022-08-17 DIAGNOSIS — D649 Anemia, unspecified: Secondary | ICD-10-CM | POA: Diagnosis not present

## 2022-08-17 DIAGNOSIS — S2249XA Multiple fractures of ribs, unspecified side, initial encounter for closed fracture: Secondary | ICD-10-CM | POA: Diagnosis not present

## 2022-08-17 DIAGNOSIS — R0609 Other forms of dyspnea: Secondary | ICD-10-CM | POA: Diagnosis not present

## 2022-08-17 DIAGNOSIS — M7989 Other specified soft tissue disorders: Secondary | ICD-10-CM | POA: Diagnosis not present

## 2022-08-23 DIAGNOSIS — M549 Dorsalgia, unspecified: Secondary | ICD-10-CM | POA: Diagnosis not present

## 2022-08-23 DIAGNOSIS — S2249XA Multiple fractures of ribs, unspecified side, initial encounter for closed fracture: Secondary | ICD-10-CM | POA: Diagnosis not present

## 2022-08-23 DIAGNOSIS — R0609 Other forms of dyspnea: Secondary | ICD-10-CM | POA: Diagnosis not present

## 2022-08-23 DIAGNOSIS — D649 Anemia, unspecified: Secondary | ICD-10-CM | POA: Diagnosis not present

## 2022-08-23 DIAGNOSIS — M7989 Other specified soft tissue disorders: Secondary | ICD-10-CM | POA: Diagnosis not present

## 2022-08-23 DIAGNOSIS — J441 Chronic obstructive pulmonary disease with (acute) exacerbation: Secondary | ICD-10-CM | POA: Diagnosis not present

## 2022-08-24 DIAGNOSIS — M7989 Other specified soft tissue disorders: Secondary | ICD-10-CM | POA: Diagnosis not present

## 2022-08-24 DIAGNOSIS — M549 Dorsalgia, unspecified: Secondary | ICD-10-CM | POA: Diagnosis not present

## 2022-08-24 DIAGNOSIS — D649 Anemia, unspecified: Secondary | ICD-10-CM | POA: Diagnosis not present

## 2022-08-24 DIAGNOSIS — J441 Chronic obstructive pulmonary disease with (acute) exacerbation: Secondary | ICD-10-CM | POA: Diagnosis not present

## 2022-08-24 DIAGNOSIS — R0609 Other forms of dyspnea: Secondary | ICD-10-CM | POA: Diagnosis not present

## 2022-08-24 DIAGNOSIS — S2249XA Multiple fractures of ribs, unspecified side, initial encounter for closed fracture: Secondary | ICD-10-CM | POA: Diagnosis not present

## 2022-08-30 DIAGNOSIS — J441 Chronic obstructive pulmonary disease with (acute) exacerbation: Secondary | ICD-10-CM | POA: Diagnosis not present

## 2022-08-30 DIAGNOSIS — R0609 Other forms of dyspnea: Secondary | ICD-10-CM | POA: Diagnosis not present

## 2022-08-30 DIAGNOSIS — D649 Anemia, unspecified: Secondary | ICD-10-CM | POA: Diagnosis not present

## 2022-08-30 DIAGNOSIS — M549 Dorsalgia, unspecified: Secondary | ICD-10-CM | POA: Diagnosis not present

## 2022-08-30 DIAGNOSIS — M7989 Other specified soft tissue disorders: Secondary | ICD-10-CM | POA: Diagnosis not present

## 2022-08-30 DIAGNOSIS — S2249XA Multiple fractures of ribs, unspecified side, initial encounter for closed fracture: Secondary | ICD-10-CM | POA: Diagnosis not present

## 2022-09-05 DIAGNOSIS — M549 Dorsalgia, unspecified: Secondary | ICD-10-CM | POA: Diagnosis not present

## 2022-09-05 DIAGNOSIS — R0609 Other forms of dyspnea: Secondary | ICD-10-CM | POA: Diagnosis not present

## 2022-09-05 DIAGNOSIS — S2249XA Multiple fractures of ribs, unspecified side, initial encounter for closed fracture: Secondary | ICD-10-CM | POA: Diagnosis not present

## 2022-09-05 DIAGNOSIS — D649 Anemia, unspecified: Secondary | ICD-10-CM | POA: Diagnosis not present

## 2022-09-05 DIAGNOSIS — J441 Chronic obstructive pulmonary disease with (acute) exacerbation: Secondary | ICD-10-CM | POA: Diagnosis not present

## 2022-09-05 DIAGNOSIS — M7989 Other specified soft tissue disorders: Secondary | ICD-10-CM | POA: Diagnosis not present

## 2022-09-06 DIAGNOSIS — M549 Dorsalgia, unspecified: Secondary | ICD-10-CM | POA: Diagnosis not present

## 2022-09-06 DIAGNOSIS — M7989 Other specified soft tissue disorders: Secondary | ICD-10-CM | POA: Diagnosis not present

## 2022-09-06 DIAGNOSIS — S2249XA Multiple fractures of ribs, unspecified side, initial encounter for closed fracture: Secondary | ICD-10-CM | POA: Diagnosis not present

## 2022-09-06 DIAGNOSIS — R0609 Other forms of dyspnea: Secondary | ICD-10-CM | POA: Diagnosis not present

## 2022-09-06 DIAGNOSIS — J441 Chronic obstructive pulmonary disease with (acute) exacerbation: Secondary | ICD-10-CM | POA: Diagnosis not present

## 2022-09-06 DIAGNOSIS — D649 Anemia, unspecified: Secondary | ICD-10-CM | POA: Diagnosis not present

## 2022-09-07 DIAGNOSIS — S2249XA Multiple fractures of ribs, unspecified side, initial encounter for closed fracture: Secondary | ICD-10-CM | POA: Diagnosis not present

## 2022-09-07 DIAGNOSIS — D649 Anemia, unspecified: Secondary | ICD-10-CM | POA: Diagnosis not present

## 2022-09-07 DIAGNOSIS — M7989 Other specified soft tissue disorders: Secondary | ICD-10-CM | POA: Diagnosis not present

## 2022-09-07 DIAGNOSIS — J441 Chronic obstructive pulmonary disease with (acute) exacerbation: Secondary | ICD-10-CM | POA: Diagnosis not present

## 2022-09-07 DIAGNOSIS — M549 Dorsalgia, unspecified: Secondary | ICD-10-CM | POA: Diagnosis not present

## 2022-09-07 DIAGNOSIS — R0609 Other forms of dyspnea: Secondary | ICD-10-CM | POA: Diagnosis not present

## 2022-09-12 DIAGNOSIS — R55 Syncope and collapse: Secondary | ICD-10-CM | POA: Diagnosis not present

## 2022-09-12 DIAGNOSIS — M549 Dorsalgia, unspecified: Secondary | ICD-10-CM | POA: Diagnosis not present

## 2022-09-12 DIAGNOSIS — M7989 Other specified soft tissue disorders: Secondary | ICD-10-CM | POA: Diagnosis not present

## 2022-09-12 DIAGNOSIS — E039 Hypothyroidism, unspecified: Secondary | ICD-10-CM | POA: Diagnosis not present

## 2022-09-12 DIAGNOSIS — D649 Anemia, unspecified: Secondary | ICD-10-CM | POA: Diagnosis not present

## 2022-09-12 DIAGNOSIS — M8000XS Age-related osteoporosis with current pathological fracture, unspecified site, sequela: Secondary | ICD-10-CM | POA: Diagnosis not present

## 2022-09-12 DIAGNOSIS — R630 Anorexia: Secondary | ICD-10-CM | POA: Diagnosis not present

## 2022-09-12 DIAGNOSIS — R32 Unspecified urinary incontinence: Secondary | ICD-10-CM | POA: Diagnosis not present

## 2022-09-12 DIAGNOSIS — F32A Depression, unspecified: Secondary | ICD-10-CM | POA: Diagnosis not present

## 2022-09-12 DIAGNOSIS — R350 Frequency of micturition: Secondary | ICD-10-CM | POA: Diagnosis not present

## 2022-09-12 DIAGNOSIS — R0609 Other forms of dyspnea: Secondary | ICD-10-CM | POA: Diagnosis not present

## 2022-09-12 DIAGNOSIS — F419 Anxiety disorder, unspecified: Secondary | ICD-10-CM | POA: Diagnosis not present

## 2022-09-12 DIAGNOSIS — Z515 Encounter for palliative care: Secondary | ICD-10-CM | POA: Diagnosis not present

## 2022-09-12 DIAGNOSIS — G4700 Insomnia, unspecified: Secondary | ICD-10-CM | POA: Diagnosis not present

## 2022-09-12 DIAGNOSIS — G8929 Other chronic pain: Secondary | ICD-10-CM | POA: Diagnosis not present

## 2022-09-12 DIAGNOSIS — I951 Orthostatic hypotension: Secondary | ICD-10-CM | POA: Diagnosis not present

## 2022-09-12 DIAGNOSIS — S22000A Wedge compression fracture of unspecified thoracic vertebra, initial encounter for closed fracture: Secondary | ICD-10-CM | POA: Diagnosis not present

## 2022-09-12 DIAGNOSIS — J441 Chronic obstructive pulmonary disease with (acute) exacerbation: Secondary | ICD-10-CM | POA: Diagnosis not present

## 2022-09-12 DIAGNOSIS — S2249XA Multiple fractures of ribs, unspecified side, initial encounter for closed fracture: Secondary | ICD-10-CM | POA: Diagnosis not present

## 2022-09-12 DIAGNOSIS — R42 Dizziness and giddiness: Secondary | ICD-10-CM | POA: Diagnosis not present

## 2022-09-16 DIAGNOSIS — D649 Anemia, unspecified: Secondary | ICD-10-CM | POA: Diagnosis not present

## 2022-09-16 DIAGNOSIS — S2249XA Multiple fractures of ribs, unspecified side, initial encounter for closed fracture: Secondary | ICD-10-CM | POA: Diagnosis not present

## 2022-09-16 DIAGNOSIS — J441 Chronic obstructive pulmonary disease with (acute) exacerbation: Secondary | ICD-10-CM | POA: Diagnosis not present

## 2022-09-16 DIAGNOSIS — M549 Dorsalgia, unspecified: Secondary | ICD-10-CM | POA: Diagnosis not present

## 2022-09-16 DIAGNOSIS — M7989 Other specified soft tissue disorders: Secondary | ICD-10-CM | POA: Diagnosis not present

## 2022-09-16 DIAGNOSIS — R0609 Other forms of dyspnea: Secondary | ICD-10-CM | POA: Diagnosis not present

## 2022-09-20 DIAGNOSIS — D649 Anemia, unspecified: Secondary | ICD-10-CM | POA: Diagnosis not present

## 2022-09-20 DIAGNOSIS — R0609 Other forms of dyspnea: Secondary | ICD-10-CM | POA: Diagnosis not present

## 2022-09-20 DIAGNOSIS — S2249XA Multiple fractures of ribs, unspecified side, initial encounter for closed fracture: Secondary | ICD-10-CM | POA: Diagnosis not present

## 2022-09-20 DIAGNOSIS — J441 Chronic obstructive pulmonary disease with (acute) exacerbation: Secondary | ICD-10-CM | POA: Diagnosis not present

## 2022-09-20 DIAGNOSIS — M549 Dorsalgia, unspecified: Secondary | ICD-10-CM | POA: Diagnosis not present

## 2022-09-20 DIAGNOSIS — M7989 Other specified soft tissue disorders: Secondary | ICD-10-CM | POA: Diagnosis not present

## 2022-09-21 DIAGNOSIS — D649 Anemia, unspecified: Secondary | ICD-10-CM | POA: Diagnosis not present

## 2022-09-21 DIAGNOSIS — S2249XA Multiple fractures of ribs, unspecified side, initial encounter for closed fracture: Secondary | ICD-10-CM | POA: Diagnosis not present

## 2022-09-21 DIAGNOSIS — M7989 Other specified soft tissue disorders: Secondary | ICD-10-CM | POA: Diagnosis not present

## 2022-09-21 DIAGNOSIS — J441 Chronic obstructive pulmonary disease with (acute) exacerbation: Secondary | ICD-10-CM | POA: Diagnosis not present

## 2022-09-21 DIAGNOSIS — M549 Dorsalgia, unspecified: Secondary | ICD-10-CM | POA: Diagnosis not present

## 2022-09-21 DIAGNOSIS — R0609 Other forms of dyspnea: Secondary | ICD-10-CM | POA: Diagnosis not present

## 2022-09-22 DIAGNOSIS — M549 Dorsalgia, unspecified: Secondary | ICD-10-CM | POA: Diagnosis not present

## 2022-09-22 DIAGNOSIS — S2249XA Multiple fractures of ribs, unspecified side, initial encounter for closed fracture: Secondary | ICD-10-CM | POA: Diagnosis not present

## 2022-09-22 DIAGNOSIS — D649 Anemia, unspecified: Secondary | ICD-10-CM | POA: Diagnosis not present

## 2022-09-22 DIAGNOSIS — J441 Chronic obstructive pulmonary disease with (acute) exacerbation: Secondary | ICD-10-CM | POA: Diagnosis not present

## 2022-09-22 DIAGNOSIS — R0609 Other forms of dyspnea: Secondary | ICD-10-CM | POA: Diagnosis not present

## 2022-09-22 DIAGNOSIS — M7989 Other specified soft tissue disorders: Secondary | ICD-10-CM | POA: Diagnosis not present

## 2022-09-27 DIAGNOSIS — R0609 Other forms of dyspnea: Secondary | ICD-10-CM | POA: Diagnosis not present

## 2022-09-27 DIAGNOSIS — D649 Anemia, unspecified: Secondary | ICD-10-CM | POA: Diagnosis not present

## 2022-09-27 DIAGNOSIS — S2249XA Multiple fractures of ribs, unspecified side, initial encounter for closed fracture: Secondary | ICD-10-CM | POA: Diagnosis not present

## 2022-09-27 DIAGNOSIS — M549 Dorsalgia, unspecified: Secondary | ICD-10-CM | POA: Diagnosis not present

## 2022-09-27 DIAGNOSIS — J441 Chronic obstructive pulmonary disease with (acute) exacerbation: Secondary | ICD-10-CM | POA: Diagnosis not present

## 2022-09-27 DIAGNOSIS — M7989 Other specified soft tissue disorders: Secondary | ICD-10-CM | POA: Diagnosis not present

## 2022-09-29 DIAGNOSIS — D649 Anemia, unspecified: Secondary | ICD-10-CM | POA: Diagnosis not present

## 2022-09-29 DIAGNOSIS — M549 Dorsalgia, unspecified: Secondary | ICD-10-CM | POA: Diagnosis not present

## 2022-09-29 DIAGNOSIS — R0609 Other forms of dyspnea: Secondary | ICD-10-CM | POA: Diagnosis not present

## 2022-09-29 DIAGNOSIS — M7989 Other specified soft tissue disorders: Secondary | ICD-10-CM | POA: Diagnosis not present

## 2022-09-29 DIAGNOSIS — J441 Chronic obstructive pulmonary disease with (acute) exacerbation: Secondary | ICD-10-CM | POA: Diagnosis not present

## 2022-09-29 DIAGNOSIS — S2249XA Multiple fractures of ribs, unspecified side, initial encounter for closed fracture: Secondary | ICD-10-CM | POA: Diagnosis not present

## 2022-10-01 DIAGNOSIS — M549 Dorsalgia, unspecified: Secondary | ICD-10-CM | POA: Diagnosis not present

## 2022-10-01 DIAGNOSIS — R0609 Other forms of dyspnea: Secondary | ICD-10-CM | POA: Diagnosis not present

## 2022-10-01 DIAGNOSIS — D649 Anemia, unspecified: Secondary | ICD-10-CM | POA: Diagnosis not present

## 2022-10-01 DIAGNOSIS — J441 Chronic obstructive pulmonary disease with (acute) exacerbation: Secondary | ICD-10-CM | POA: Diagnosis not present

## 2022-10-01 DIAGNOSIS — S2249XA Multiple fractures of ribs, unspecified side, initial encounter for closed fracture: Secondary | ICD-10-CM | POA: Diagnosis not present

## 2022-10-01 DIAGNOSIS — M7989 Other specified soft tissue disorders: Secondary | ICD-10-CM | POA: Diagnosis not present

## 2022-10-04 DIAGNOSIS — R0609 Other forms of dyspnea: Secondary | ICD-10-CM | POA: Diagnosis not present

## 2022-10-04 DIAGNOSIS — S2249XA Multiple fractures of ribs, unspecified side, initial encounter for closed fracture: Secondary | ICD-10-CM | POA: Diagnosis not present

## 2022-10-04 DIAGNOSIS — J441 Chronic obstructive pulmonary disease with (acute) exacerbation: Secondary | ICD-10-CM | POA: Diagnosis not present

## 2022-10-04 DIAGNOSIS — M549 Dorsalgia, unspecified: Secondary | ICD-10-CM | POA: Diagnosis not present

## 2022-10-04 DIAGNOSIS — M7989 Other specified soft tissue disorders: Secondary | ICD-10-CM | POA: Diagnosis not present

## 2022-10-04 DIAGNOSIS — D649 Anemia, unspecified: Secondary | ICD-10-CM | POA: Diagnosis not present

## 2022-10-05 DIAGNOSIS — J441 Chronic obstructive pulmonary disease with (acute) exacerbation: Secondary | ICD-10-CM | POA: Diagnosis not present

## 2022-10-05 DIAGNOSIS — M7989 Other specified soft tissue disorders: Secondary | ICD-10-CM | POA: Diagnosis not present

## 2022-10-05 DIAGNOSIS — R0609 Other forms of dyspnea: Secondary | ICD-10-CM | POA: Diagnosis not present

## 2022-10-05 DIAGNOSIS — D649 Anemia, unspecified: Secondary | ICD-10-CM | POA: Diagnosis not present

## 2022-10-05 DIAGNOSIS — S2249XA Multiple fractures of ribs, unspecified side, initial encounter for closed fracture: Secondary | ICD-10-CM | POA: Diagnosis not present

## 2022-10-05 DIAGNOSIS — M549 Dorsalgia, unspecified: Secondary | ICD-10-CM | POA: Diagnosis not present

## 2022-10-07 DIAGNOSIS — D649 Anemia, unspecified: Secondary | ICD-10-CM | POA: Diagnosis not present

## 2022-10-07 DIAGNOSIS — J441 Chronic obstructive pulmonary disease with (acute) exacerbation: Secondary | ICD-10-CM | POA: Diagnosis not present

## 2022-10-07 DIAGNOSIS — S2249XA Multiple fractures of ribs, unspecified side, initial encounter for closed fracture: Secondary | ICD-10-CM | POA: Diagnosis not present

## 2022-10-07 DIAGNOSIS — M549 Dorsalgia, unspecified: Secondary | ICD-10-CM | POA: Diagnosis not present

## 2022-10-07 DIAGNOSIS — M7989 Other specified soft tissue disorders: Secondary | ICD-10-CM | POA: Diagnosis not present

## 2022-10-07 DIAGNOSIS — R0609 Other forms of dyspnea: Secondary | ICD-10-CM | POA: Diagnosis not present

## 2022-10-11 DIAGNOSIS — M549 Dorsalgia, unspecified: Secondary | ICD-10-CM | POA: Diagnosis not present

## 2022-10-11 DIAGNOSIS — S2249XA Multiple fractures of ribs, unspecified side, initial encounter for closed fracture: Secondary | ICD-10-CM | POA: Diagnosis not present

## 2022-10-11 DIAGNOSIS — M7989 Other specified soft tissue disorders: Secondary | ICD-10-CM | POA: Diagnosis not present

## 2022-10-11 DIAGNOSIS — J441 Chronic obstructive pulmonary disease with (acute) exacerbation: Secondary | ICD-10-CM | POA: Diagnosis not present

## 2022-10-11 DIAGNOSIS — R0609 Other forms of dyspnea: Secondary | ICD-10-CM | POA: Diagnosis not present

## 2022-10-11 DIAGNOSIS — D649 Anemia, unspecified: Secondary | ICD-10-CM | POA: Diagnosis not present

## 2022-10-12 DIAGNOSIS — R0609 Other forms of dyspnea: Secondary | ICD-10-CM | POA: Diagnosis not present

## 2022-10-12 DIAGNOSIS — I951 Orthostatic hypotension: Secondary | ICD-10-CM | POA: Diagnosis not present

## 2022-10-12 DIAGNOSIS — R55 Syncope and collapse: Secondary | ICD-10-CM | POA: Diagnosis not present

## 2022-10-12 DIAGNOSIS — Z515 Encounter for palliative care: Secondary | ICD-10-CM | POA: Diagnosis not present

## 2022-10-12 DIAGNOSIS — F419 Anxiety disorder, unspecified: Secondary | ICD-10-CM | POA: Diagnosis not present

## 2022-10-12 DIAGNOSIS — R42 Dizziness and giddiness: Secondary | ICD-10-CM | POA: Diagnosis not present

## 2022-10-12 DIAGNOSIS — R350 Frequency of micturition: Secondary | ICD-10-CM | POA: Diagnosis not present

## 2022-10-12 DIAGNOSIS — S22000A Wedge compression fracture of unspecified thoracic vertebra, initial encounter for closed fracture: Secondary | ICD-10-CM | POA: Diagnosis not present

## 2022-10-12 DIAGNOSIS — R32 Unspecified urinary incontinence: Secondary | ICD-10-CM | POA: Diagnosis not present

## 2022-10-12 DIAGNOSIS — M8000XS Age-related osteoporosis with current pathological fracture, unspecified site, sequela: Secondary | ICD-10-CM | POA: Diagnosis not present

## 2022-10-12 DIAGNOSIS — F32A Depression, unspecified: Secondary | ICD-10-CM | POA: Diagnosis not present

## 2022-10-12 DIAGNOSIS — J441 Chronic obstructive pulmonary disease with (acute) exacerbation: Secondary | ICD-10-CM | POA: Diagnosis not present

## 2022-10-12 DIAGNOSIS — S2249XA Multiple fractures of ribs, unspecified side, initial encounter for closed fracture: Secondary | ICD-10-CM | POA: Diagnosis not present

## 2022-10-12 DIAGNOSIS — M7989 Other specified soft tissue disorders: Secondary | ICD-10-CM | POA: Diagnosis not present

## 2022-10-12 DIAGNOSIS — G4700 Insomnia, unspecified: Secondary | ICD-10-CM | POA: Diagnosis not present

## 2022-10-12 DIAGNOSIS — E039 Hypothyroidism, unspecified: Secondary | ICD-10-CM | POA: Diagnosis not present

## 2022-10-12 DIAGNOSIS — M549 Dorsalgia, unspecified: Secondary | ICD-10-CM | POA: Diagnosis not present

## 2022-10-12 DIAGNOSIS — D649 Anemia, unspecified: Secondary | ICD-10-CM | POA: Diagnosis not present

## 2022-10-12 DIAGNOSIS — R630 Anorexia: Secondary | ICD-10-CM | POA: Diagnosis not present

## 2022-10-12 DIAGNOSIS — G8929 Other chronic pain: Secondary | ICD-10-CM | POA: Diagnosis not present

## 2022-10-13 DIAGNOSIS — D649 Anemia, unspecified: Secondary | ICD-10-CM | POA: Diagnosis not present

## 2022-10-13 DIAGNOSIS — M549 Dorsalgia, unspecified: Secondary | ICD-10-CM | POA: Diagnosis not present

## 2022-10-13 DIAGNOSIS — R0609 Other forms of dyspnea: Secondary | ICD-10-CM | POA: Diagnosis not present

## 2022-10-13 DIAGNOSIS — S2249XA Multiple fractures of ribs, unspecified side, initial encounter for closed fracture: Secondary | ICD-10-CM | POA: Diagnosis not present

## 2022-10-13 DIAGNOSIS — M7989 Other specified soft tissue disorders: Secondary | ICD-10-CM | POA: Diagnosis not present

## 2022-10-13 DIAGNOSIS — J441 Chronic obstructive pulmonary disease with (acute) exacerbation: Secondary | ICD-10-CM | POA: Diagnosis not present

## 2022-10-14 DIAGNOSIS — S2249XA Multiple fractures of ribs, unspecified side, initial encounter for closed fracture: Secondary | ICD-10-CM | POA: Diagnosis not present

## 2022-10-14 DIAGNOSIS — J441 Chronic obstructive pulmonary disease with (acute) exacerbation: Secondary | ICD-10-CM | POA: Diagnosis not present

## 2022-10-14 DIAGNOSIS — R0609 Other forms of dyspnea: Secondary | ICD-10-CM | POA: Diagnosis not present

## 2022-10-14 DIAGNOSIS — D649 Anemia, unspecified: Secondary | ICD-10-CM | POA: Diagnosis not present

## 2022-10-14 DIAGNOSIS — M7989 Other specified soft tissue disorders: Secondary | ICD-10-CM | POA: Diagnosis not present

## 2022-10-14 DIAGNOSIS — M549 Dorsalgia, unspecified: Secondary | ICD-10-CM | POA: Diagnosis not present

## 2022-10-15 DIAGNOSIS — M7989 Other specified soft tissue disorders: Secondary | ICD-10-CM | POA: Diagnosis not present

## 2022-10-15 DIAGNOSIS — M549 Dorsalgia, unspecified: Secondary | ICD-10-CM | POA: Diagnosis not present

## 2022-10-15 DIAGNOSIS — S2249XA Multiple fractures of ribs, unspecified side, initial encounter for closed fracture: Secondary | ICD-10-CM | POA: Diagnosis not present

## 2022-10-15 DIAGNOSIS — D649 Anemia, unspecified: Secondary | ICD-10-CM | POA: Diagnosis not present

## 2022-10-15 DIAGNOSIS — J441 Chronic obstructive pulmonary disease with (acute) exacerbation: Secondary | ICD-10-CM | POA: Diagnosis not present

## 2022-10-15 DIAGNOSIS — R0609 Other forms of dyspnea: Secondary | ICD-10-CM | POA: Diagnosis not present

## 2022-10-16 DIAGNOSIS — M7989 Other specified soft tissue disorders: Secondary | ICD-10-CM | POA: Diagnosis not present

## 2022-10-16 DIAGNOSIS — R0609 Other forms of dyspnea: Secondary | ICD-10-CM | POA: Diagnosis not present

## 2022-10-16 DIAGNOSIS — S2249XA Multiple fractures of ribs, unspecified side, initial encounter for closed fracture: Secondary | ICD-10-CM | POA: Diagnosis not present

## 2022-10-16 DIAGNOSIS — J441 Chronic obstructive pulmonary disease with (acute) exacerbation: Secondary | ICD-10-CM | POA: Diagnosis not present

## 2022-10-16 DIAGNOSIS — M549 Dorsalgia, unspecified: Secondary | ICD-10-CM | POA: Diagnosis not present

## 2022-10-16 DIAGNOSIS — D649 Anemia, unspecified: Secondary | ICD-10-CM | POA: Diagnosis not present

## 2022-10-17 DIAGNOSIS — M7989 Other specified soft tissue disorders: Secondary | ICD-10-CM | POA: Diagnosis not present

## 2022-10-17 DIAGNOSIS — M549 Dorsalgia, unspecified: Secondary | ICD-10-CM | POA: Diagnosis not present

## 2022-10-17 DIAGNOSIS — D649 Anemia, unspecified: Secondary | ICD-10-CM | POA: Diagnosis not present

## 2022-10-17 DIAGNOSIS — J441 Chronic obstructive pulmonary disease with (acute) exacerbation: Secondary | ICD-10-CM | POA: Diagnosis not present

## 2022-10-17 DIAGNOSIS — R0609 Other forms of dyspnea: Secondary | ICD-10-CM | POA: Diagnosis not present

## 2022-10-17 DIAGNOSIS — S2249XA Multiple fractures of ribs, unspecified side, initial encounter for closed fracture: Secondary | ICD-10-CM | POA: Diagnosis not present

## 2022-10-18 DIAGNOSIS — M549 Dorsalgia, unspecified: Secondary | ICD-10-CM | POA: Diagnosis not present

## 2022-10-18 DIAGNOSIS — R0609 Other forms of dyspnea: Secondary | ICD-10-CM | POA: Diagnosis not present

## 2022-10-18 DIAGNOSIS — J441 Chronic obstructive pulmonary disease with (acute) exacerbation: Secondary | ICD-10-CM | POA: Diagnosis not present

## 2022-10-18 DIAGNOSIS — M7989 Other specified soft tissue disorders: Secondary | ICD-10-CM | POA: Diagnosis not present

## 2022-10-18 DIAGNOSIS — D649 Anemia, unspecified: Secondary | ICD-10-CM | POA: Diagnosis not present

## 2022-10-18 DIAGNOSIS — S2249XA Multiple fractures of ribs, unspecified side, initial encounter for closed fracture: Secondary | ICD-10-CM | POA: Diagnosis not present

## 2022-10-19 DIAGNOSIS — D649 Anemia, unspecified: Secondary | ICD-10-CM | POA: Diagnosis not present

## 2022-10-19 DIAGNOSIS — S2249XA Multiple fractures of ribs, unspecified side, initial encounter for closed fracture: Secondary | ICD-10-CM | POA: Diagnosis not present

## 2022-10-19 DIAGNOSIS — M549 Dorsalgia, unspecified: Secondary | ICD-10-CM | POA: Diagnosis not present

## 2022-10-19 DIAGNOSIS — R0609 Other forms of dyspnea: Secondary | ICD-10-CM | POA: Diagnosis not present

## 2022-10-19 DIAGNOSIS — M7989 Other specified soft tissue disorders: Secondary | ICD-10-CM | POA: Diagnosis not present

## 2022-10-19 DIAGNOSIS — J441 Chronic obstructive pulmonary disease with (acute) exacerbation: Secondary | ICD-10-CM | POA: Diagnosis not present

## 2022-11-12 DEATH — deceased
